# Patient Record
Sex: Female | Born: 1955
Health system: Southern US, Community
[De-identification: ages and names within clinical notes are randomized; demographics above are authoritative.]

## PROBLEM LIST (undated history)

## (undated) DIAGNOSIS — C801 Malignant (primary) neoplasm, unspecified: Secondary | ICD-10-CM

## (undated) DIAGNOSIS — E782 Mixed hyperlipidemia: Secondary | ICD-10-CM

## (undated) DIAGNOSIS — F32A Depression, unspecified: Secondary | ICD-10-CM

## (undated) DIAGNOSIS — I639 Cerebral infarction, unspecified: Secondary | ICD-10-CM

## (undated) DIAGNOSIS — D72819 Decreased white blood cell count, unspecified: Principal | ICD-10-CM

## (undated) DIAGNOSIS — G4730 Sleep apnea, unspecified: Secondary | ICD-10-CM

## (undated) DIAGNOSIS — K219 Gastro-esophageal reflux disease without esophagitis: Secondary | ICD-10-CM

## (undated) DIAGNOSIS — K635 Polyp of colon: Secondary | ICD-10-CM

## (undated) DIAGNOSIS — F419 Anxiety disorder, unspecified: Secondary | ICD-10-CM

## (undated) DIAGNOSIS — I10 Essential (primary) hypertension: Secondary | ICD-10-CM

## (undated) HISTORY — PX: OTHER SURGICAL HISTORY: SHX169

## (undated) HISTORY — DX: Polyp of colon: K63.5

## (undated) HISTORY — DX: Mixed hyperlipidemia: E78.2

## (undated) HISTORY — DX: Depression, unspecified: F32.A

## (undated) HISTORY — DX: Anxiety disorder, unspecified: F41.9

## (undated) HISTORY — DX: Essential (primary) hypertension: I10

## (undated) HISTORY — PX: TUBAL LIGATION: SHX77

## (undated) HISTORY — PX: KNEE ARTHROSCOPY: SUR90

## (undated) HISTORY — DX: Decreased white blood cell count, unspecified: D72.819

## (undated) HISTORY — DX: Cerebral infarction, unspecified: I63.9

## (undated) HISTORY — DX: Gastro-esophageal reflux disease without esophagitis: K21.9

## (undated) HISTORY — DX: Malignant (primary) neoplasm, unspecified: C80.1

---

## 1998-06-09 ENCOUNTER — Other Ambulatory Visit: Admission: RE | Admit: 1998-06-09 | Discharge: 1998-06-09 | Payer: Self-pay | Admitting: Obstetrics and Gynecology

## 1999-12-01 ENCOUNTER — Other Ambulatory Visit: Admission: RE | Admit: 1999-12-01 | Discharge: 1999-12-01 | Payer: Self-pay | Admitting: Obstetrics and Gynecology

## 2000-01-01 ENCOUNTER — Encounter (INDEPENDENT_AMBULATORY_CARE_PROVIDER_SITE_OTHER): Payer: Self-pay | Admitting: Specialist

## 2000-01-01 ENCOUNTER — Other Ambulatory Visit: Admission: RE | Admit: 2000-01-01 | Discharge: 2000-01-01 | Payer: Self-pay | Admitting: Obstetrics and Gynecology

## 2000-12-15 ENCOUNTER — Other Ambulatory Visit: Admission: RE | Admit: 2000-12-15 | Discharge: 2000-12-15 | Payer: Self-pay | Admitting: Obstetrics and Gynecology

## 2001-01-02 ENCOUNTER — Other Ambulatory Visit: Admission: RE | Admit: 2001-01-02 | Discharge: 2001-01-02 | Payer: Self-pay | Admitting: Obstetrics and Gynecology

## 2001-01-02 ENCOUNTER — Encounter (INDEPENDENT_AMBULATORY_CARE_PROVIDER_SITE_OTHER): Payer: Self-pay | Admitting: Specialist

## 2001-01-21 ENCOUNTER — Emergency Department (HOSPITAL_COMMUNITY): Admission: EM | Admit: 2001-01-21 | Discharge: 2001-01-21 | Payer: Self-pay | Admitting: Emergency Medicine

## 2001-01-25 ENCOUNTER — Ambulatory Visit (HOSPITAL_COMMUNITY): Admission: RE | Admit: 2001-01-25 | Discharge: 2001-01-25 | Payer: Self-pay | Admitting: Gastroenterology

## 2001-01-25 ENCOUNTER — Encounter (INDEPENDENT_AMBULATORY_CARE_PROVIDER_SITE_OTHER): Payer: Self-pay | Admitting: Specialist

## 2002-01-22 ENCOUNTER — Other Ambulatory Visit: Admission: RE | Admit: 2002-01-22 | Discharge: 2002-01-22 | Payer: Self-pay | Admitting: Obstetrics and Gynecology

## 2003-02-04 ENCOUNTER — Other Ambulatory Visit: Admission: RE | Admit: 2003-02-04 | Discharge: 2003-02-04 | Payer: Self-pay | Admitting: Obstetrics and Gynecology

## 2004-02-24 ENCOUNTER — Other Ambulatory Visit: Admission: RE | Admit: 2004-02-24 | Discharge: 2004-02-24 | Payer: Self-pay | Admitting: Obstetrics and Gynecology

## 2004-03-25 ENCOUNTER — Ambulatory Visit (HOSPITAL_BASED_OUTPATIENT_CLINIC_OR_DEPARTMENT_OTHER): Admission: RE | Admit: 2004-03-25 | Discharge: 2004-03-25 | Payer: Self-pay | Admitting: Family Medicine

## 2007-07-25 ENCOUNTER — Ambulatory Visit: Payer: Self-pay | Admitting: Family Medicine

## 2007-07-27 ENCOUNTER — Ambulatory Visit: Payer: Self-pay | Admitting: Family Medicine

## 2007-11-04 ENCOUNTER — Ambulatory Visit (HOSPITAL_BASED_OUTPATIENT_CLINIC_OR_DEPARTMENT_OTHER): Admission: RE | Admit: 2007-11-04 | Discharge: 2007-11-04 | Payer: Self-pay | Admitting: Family Medicine

## 2007-12-25 ENCOUNTER — Ambulatory Visit (HOSPITAL_BASED_OUTPATIENT_CLINIC_OR_DEPARTMENT_OTHER): Admission: RE | Admit: 2007-12-25 | Discharge: 2007-12-25 | Payer: Self-pay | Admitting: Family Medicine

## 2008-01-04 ENCOUNTER — Ambulatory Visit: Payer: Self-pay | Admitting: Pulmonary Disease

## 2008-06-05 ENCOUNTER — Ambulatory Visit: Payer: Self-pay | Admitting: Family Medicine

## 2008-12-05 ENCOUNTER — Ambulatory Visit: Payer: Self-pay | Admitting: Family Medicine

## 2008-12-12 ENCOUNTER — Encounter: Admission: RE | Admit: 2008-12-12 | Discharge: 2008-12-12 | Payer: Self-pay | Admitting: Family Medicine

## 2009-07-02 ENCOUNTER — Ambulatory Visit: Payer: Self-pay | Admitting: Family Medicine

## 2009-07-02 ENCOUNTER — Encounter: Admission: RE | Admit: 2009-07-02 | Discharge: 2009-07-02 | Payer: Self-pay | Admitting: Family Medicine

## 2009-11-18 ENCOUNTER — Ambulatory Visit: Payer: Self-pay | Admitting: Family Medicine

## 2010-07-28 ENCOUNTER — Ambulatory Visit: Payer: Self-pay | Admitting: Family Medicine

## 2010-09-15 ENCOUNTER — Ambulatory Visit: Payer: Self-pay | Admitting: Family Medicine

## 2010-10-31 ENCOUNTER — Encounter: Payer: Self-pay | Admitting: Family Medicine

## 2010-11-01 ENCOUNTER — Encounter: Payer: Self-pay | Admitting: Family Medicine

## 2011-02-23 NOTE — Procedures (Signed)
NAME:  Carol Harris, Carol Harris                ACCOUNT NO.:  [id number]   MEDICAL RECORD NO.:  [id number]          PATIENT TYPE:  OUT   LOCATION:  SLEEP CENTER                 FACILITY:  [facility]   PHYSICIAN:  Barbaraann Share, MD,FCCPDATE OF BIRTH:  1954/09/03   DATE OF STUDY:  12/25/2007                            NOCTURNAL POLYSOMNOGRAM   REFERRING PHYSICIAN:   LOCATION:  Sleep Lab.   REFERRING PHYSICIAN:  Dr. Sharlot Gowda.   DATE OF STUDY:  December 25, 2007.   INDICATIONS FOR STUDY:  Hypersomnia with sleep apnea.   EPWORTH SCORE:  11.   SLEEP ARCHITECTURE:  The patient had a total sleep time of 351 minutes  with very slow wave sleep and borderline decreased REM.  Sleep onset  latency was normal at 13 minutes, and REM onset was normal at 74  minutes.  Sleep efficiency was adequate at 92%.   RESPIRATORY DATA:  The patient was found to have 9 obstructive apneas,  10 central apneas, and 11 obstructive hypopneas, for an apnea hypopnea  index of 5 events per hour.  The events were worse in the supine  position, and there was mild snoring noted throughout.   OXYGEN DATA:  There was 02 desaturation as low as 91% with the patient's  obstructive events.   CARDIAC DATA:  No clinically significant arrhythmias were noted.   MOVEMENT/PARASOMNIA:  No significant leg jerks or abnormal behaviors  were noted.   IMPRESSION/RECOMMENDATIONS:  1. Very mild obstructive sleep apnea/hypopnea syndrome with an apnea      hypopnea index of 5 events per hour and 02 desaturation as low as      91%.  Treatment for this degree of sleep apnea can include weight      loss alone if applicable, upper airway surgery, oral appliance, and      also CPAP.  Clinical correlation is suggested. However, given the      very mild nature of her sleep apnea, treatment should be based on      whether this is significantly      impacting her quality of life.  It does not represent an increase      in cardiovascular risk.  2. No  clinically significant arrhythmias or leg jerks were noted      during the night.      Barbaraann Share, MD,FCCP  Diplomate, American Board of Sleep  Medicine  Electronically Signed     [facility]/MED[facility]  D:  01/04/2008 15:42:12  T:  01/04/2008 19:14:23  Job:  [id number]

## 2011-04-21 ENCOUNTER — Encounter: Payer: Self-pay | Admitting: Medical

## 2011-04-21 ENCOUNTER — Ambulatory Visit (INDEPENDENT_AMBULATORY_CARE_PROVIDER_SITE_OTHER): Payer: BC Managed Care – PPO | Admitting: Medical

## 2011-04-21 VITALS — BP 108/78 | HR 64 | Temp 97.5°F | Ht 65.5 in | Wt 151.0 lb

## 2011-04-21 DIAGNOSIS — S61209A Unspecified open wound of unspecified finger without damage to nail, initial encounter: Secondary | ICD-10-CM

## 2011-04-21 DIAGNOSIS — W5501XA Bitten by cat, initial encounter: Secondary | ICD-10-CM

## 2011-04-21 DIAGNOSIS — IMO0001 Reserved for inherently not codable concepts without codable children: Secondary | ICD-10-CM

## 2011-04-21 DIAGNOSIS — Z23 Encounter for immunization: Secondary | ICD-10-CM

## 2011-04-21 MED ORDER — AMOXICILLIN-POT CLAVULANATE 875-125 MG PO TABS: 1.0000 | ORAL_TABLET | Freq: Two times a day (BID) | ORAL | Status: AC

## 2011-04-21 NOTE — Progress Notes (Signed)
Subjective:   HPI Mrs. Lanagan presents for hx/o cat bite.  Her cat who stays inside bit her on her left middle index finger.  No prior similar issue.  She was playing with the cat when this occurred.  This occurred last night, and the finger bled, was swollen, red, and painful, but it is a little better today.  No other aggravating or relieving factors.  No other c/o.  The following portions of the patient's history were reviewed and updated as appropriate: allergies, current medications, past family history, past medical history, past social history, past surgical history and problem list.   Review of Systems Gen: no fever, chills GI: no n/v/d Skin: redness, warmth     Objective:   Physical Exam  General appearance: alert, no distress, WD/WN, white female Skin: left middle finger distal phalanx with erythema, swelling, and puncture wound Pulses: normal 2+ and normal cap refill Neuro: no numbness    Assessment :    Encounter Diagnoses  Name Primary?  . Cat bite Yes  . Need for vaccination with DTP or DTaP      Plan:   Cat bite - Augmentin.  Discussed signs of worse infection that would prompt urgent recheck.  Updated tetanus booster today.  Last one noted in chart was 2002.

## 2011-08-12 ENCOUNTER — Encounter: Payer: BC Managed Care – PPO | Admitting: Oncology

## 2011-09-05 ENCOUNTER — Other Ambulatory Visit: Payer: Self-pay | Admitting: Oncology

## 2011-09-05 ENCOUNTER — Encounter: Payer: Self-pay | Admitting: Oncology

## 2011-09-05 DIAGNOSIS — D72819 Decreased white blood cell count, unspecified: Secondary | ICD-10-CM | POA: Insufficient documentation

## 2011-09-05 DIAGNOSIS — I10 Essential (primary) hypertension: Secondary | ICD-10-CM | POA: Insufficient documentation

## 2011-09-05 DIAGNOSIS — E782 Mixed hyperlipidemia: Secondary | ICD-10-CM | POA: Insufficient documentation

## 2011-09-05 HISTORY — DX: Essential (primary) hypertension: I10

## 2011-09-05 HISTORY — DX: Mixed hyperlipidemia: E78.2

## 2011-09-05 HISTORY — DX: Decreased white blood cell count, unspecified: D72.819

## 2011-09-08 ENCOUNTER — Ambulatory Visit (HOSPITAL_BASED_OUTPATIENT_CLINIC_OR_DEPARTMENT_OTHER): Payer: BC Managed Care – PPO | Admitting: Oncology

## 2011-09-08 ENCOUNTER — Other Ambulatory Visit: Payer: Self-pay | Admitting: Oncology

## 2011-09-08 ENCOUNTER — Encounter: Payer: Self-pay | Admitting: *Deleted

## 2011-09-08 ENCOUNTER — Other Ambulatory Visit (HOSPITAL_BASED_OUTPATIENT_CLINIC_OR_DEPARTMENT_OTHER): Payer: BC Managed Care – PPO | Admitting: Lab

## 2011-09-08 VITALS — BP 114/80 | HR 76 | Temp 97.9°F | Ht 65.25 in | Wt 159.1 lb

## 2011-09-08 DIAGNOSIS — F329 Major depressive disorder, single episode, unspecified: Secondary | ICD-10-CM

## 2011-09-08 DIAGNOSIS — D72819 Decreased white blood cell count, unspecified: Secondary | ICD-10-CM

## 2011-09-08 DIAGNOSIS — F3289 Other specified depressive episodes: Secondary | ICD-10-CM

## 2011-09-08 DIAGNOSIS — D649 Anemia, unspecified: Secondary | ICD-10-CM

## 2011-09-08 DIAGNOSIS — I10 Essential (primary) hypertension: Secondary | ICD-10-CM

## 2011-09-08 DIAGNOSIS — E782 Mixed hyperlipidemia: Secondary | ICD-10-CM

## 2011-09-08 LAB — COMPREHENSIVE METABOLIC PANEL
AST: 19 U/L (ref 0–37)
BUN: 16 mg/dL (ref 6–23)
Calcium: 9.8 mg/dL (ref 8.4–10.5)
Chloride: 102 mEq/L (ref 96–112)
Creatinine, Ser: 0.82 mg/dL (ref 0.50–1.10)
Glucose, Bld: 90 mg/dL (ref 70–99)

## 2011-09-08 LAB — CBC & DIFF AND RETIC
BASO%: 0.3 % (ref 0.0–2.0)
EOS%: 1.3 % (ref 0.0–7.0)
HCT: 42.5 % (ref 34.8–46.6)
Immature Retic Fract: 8.2 % (ref 1.60–10.00)
MCH: 30.8 pg (ref 25.1–34.0)
MCHC: 34.8 g/dL (ref 31.5–36.0)
NEUT%: 58.7 % (ref 38.4–76.8)
RBC: 4.8 10*6/uL (ref 3.70–5.45)
Retic %: 1 % (ref 0.70–2.10)
WBC: 6.2 10*3/uL (ref 3.9–10.3)
lymph#: 1.9 10*3/uL (ref 0.9–3.3)

## 2011-09-08 LAB — MORPHOLOGY

## 2011-09-08 LAB — LACTATE DEHYDROGENASE: LDH: 153 U/L (ref 94–250)

## 2011-09-08 LAB — CHCC SMEAR

## 2011-09-08 NOTE — Progress Notes (Signed)
New Patient Evaluation   Carol Harris [id number] 25-Nov-1955 55 y.o. 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  Carol Harris has been in overall excellent health without any major medical or surgical problems. That is provided as far back as 06/17/10 and total white count was 4553 neutrophils 35 lymphocytes 9 monocytes 1 eosinophil 1 basophil hemoglobin 15 hematocrit 45 MCV 95 platelet count 266,000. White count to fluctuate over time and have been recorded as low as 3500 on 08/02/2011. Differential counts have always been normal. Most recent blood count recorded on/31/12 with white count 3855 neutrophils 34 lymphocytes 8 monocytes 2 eosinophils hemoglobin 13.8 platelets 288,000.  She has no history of a recent or remote severe viral illness. She has never had mononucleosis. She of hepatitis yellow jaundice or malaria. She has not worked around any toxic chemicals. She is not on any medications that are usually associated with leukopenia. She is not known to have any chronic arthritis condition and specifically denies history of rheumatoid arthritis or lupus. She of a blood disorder. One of her brothers had acute myeloid leukemia in his 30s. He was cured with a sibling bone marrow transplant from one of his sisters.  PMH: Past Medical History  Diagnosis Date  . GERD (gastroesophageal reflux disease)   . Hyperlipidemia   . Anxiety   . Hypertension   . Colon polyp   . Leukopenia 09/05/2011  . HTN (hypertension), benign 09/05/2011  . Hyperlipidemia, mixed Depression  09/05/2011    Surgery:  Tubal ligation x2 in the past.  Allergies: Severe reaction to Pneumovax vaccine about 20 years ago with a diffuse swelling of her left arm and side.  Medications: Medications Prior to Admission    Medication Sig Dispense Refill  . amLODipine (NORVASC) 5 MG tablet Take 5 mg by mouth daily.        Marland Kitchen atenolol (TENORMIN) 25 MG tablet Take 12.5 mg by mouth daily.        Marland Kitchen buPROPion (WELLBUTRIN XL) 300 MG 24 hr tablet Take 300 mg by mouth daily.         No current facility-administered medications on file as of 09/08/2011.    Social History:   reports that she has never smoked. She has never used smokeless tobacco. She reports that she drinks about .5 ounces of alcohol per week. She reports that she does not use illicit drugs.  Family History: One brother with AML in remission post allogeneic marrow transplant. 2 sisters alive and well. No other family history of blood disorders.   Review of Systems: Constitutional ROS:  noFever, Chills, Night Sweats,  No Anorexia, Pain  Cardiovascular ROS: no chest pain or dyspnea on exertion Respiratory ROS: no cough, shortness of breath, or wheezing Neurological ROS: no TIA or stroke symptoms Dermatological ROS: negative ENT ROS: negative Gastrointestinal ROS: no abdominal pain, change in bowel habits, or black or bloody stools Genito-Urinary ROS: no dysuria, trouble voiding, or hematuria Hematological and Lymphatic ROS: negative Breast ROS: negative Musculoskeletal ROS: negative Remaining ROS negative.She had a dizzy spell this AM. She developed hypotension/almost passed out after donating blood in the past..   Physical Exam: Blood pressure 114/80, pulse 76, temperature 97.9 F (36.6 C), temperature source Oral, height 5' 5.25" (1.657 m), weight 159 lb 1.6 oz (72.167 kg). General appearance: alert, cooperative and appears  stated age Head: Normocephalic, without obvious abnormality, atraumatic Neck: {neck exam:17463::"no adenopathy","no carotid bruit","no JVD","supple, symmetrical, trachea midline","thyroid not enlarged, symmetric, no tenderness/mass/nodules" Cardiac: no murmur or gallop; regular rhythm Lymph nodes: Cervical,  supraclavicular, and axillary nodes normal. Neurologic: Nerves intact. Optic discs sharp on the right not well visualized on the left. Vessels normal. No carotid bruits. Motor strength 5 over 5. Reflexes absent symmetric at the knees 1+ symmetric at the biceps. Sensation is moderately decreased to vibration by tuning fork exam over the fingertips. Skin: No ecchymoses petechiae or rash.  Lab Results: Lab Results  Component Value Date   WBC 6.2 09/08/2011   HGB 14.8 09/08/2011   HCT 42.5 09/08/2011   MCV 88.5 09/08/2011   PLT 266 09/08/2011     Chemistry   No results found for this basename: NA, K, CL, CO2, BUN, CREATININE, GLU   No results found for this basename: CALCIUM, ALKPHOS, AST, ALT, BILITOT       Pathology: Review of the peripheral blood film today shows overall normal number of neutrophils. The neutrophils have a unusual appearance with peripheral clear zones that are devoid of granules. This is something that I have not encountered before. I don't think it's pathologic but it is a unusual observation. There are decreased lobes in some of the neutrophils. There are occasional benign reactive lymphocytes. Platelets are normal.  Radiological Studies:   Impression and Plan: Intermittent mild leukopenia with normal white count differential and an otherwise healthy woman.  This may just be a variation of normal may represent the footprints of a prior viral infection. She is not anemic. She has a normal platelet count. Normal physical exam. Although it is possible that this is medication related, Wellbutrin is not a common offender. I would consider tapering her off the Wellbutrin for just 2 months to see if it makes any difference. This being said, she is on her Wellbutrin right now and her white count today is normal.  Recommendation I feel comfortable with observation alone. Schedule a formal revisit here but would be happy to see her again in the future if things change.       Levert Feinstein, MD 09/08/2011, 3:31 PM

## 2011-09-08 NOTE — Progress Notes (Signed)
Copy of cbc/morphology routed to Dr Dola Factor & PA Roxanne Mins per Dr. Cyndie Chime.

## 2011-11-08 ENCOUNTER — Other Ambulatory Visit: Payer: Self-pay | Admitting: Oncology

## 2011-11-08 NOTE — Progress Notes (Signed)
nrnrfnr

## 2012-02-22 ENCOUNTER — Ambulatory Visit (INDEPENDENT_AMBULATORY_CARE_PROVIDER_SITE_OTHER): Payer: BC Managed Care – PPO | Admitting: Medical

## 2012-02-22 ENCOUNTER — Telehealth: Payer: Self-pay | Admitting: Medical

## 2012-02-22 ENCOUNTER — Encounter: Payer: Self-pay | Admitting: Medical

## 2012-02-22 VITALS — BP 120/80 | HR 82 | Temp 98.3°F | Resp 16 | Wt 159.0 lb

## 2012-02-22 DIAGNOSIS — M25562 Pain in left knee: Secondary | ICD-10-CM

## 2012-02-22 DIAGNOSIS — M25569 Pain in unspecified knee: Secondary | ICD-10-CM

## 2012-02-22 MED ORDER — DICLOFENAC SODIUM 75 MG PO TBEC: 75.0000 mg | DELAYED_RELEASE_TABLET | Freq: Two times a day (BID) | ORAL | Status: AC

## 2012-02-22 NOTE — Progress Notes (Signed)
Subjective: Here for c/o left knee.  Started several weeks ago.  She denies specific injury or trauma.  She notes swelling, pain, recently developed some pain in back of knee and quadriceps pain.  She notes right meniscal tear 10 years ago (degenerative in nature), and thinks this feels the same on the left now.  Saw Dr. Ethelene Browns [place]/ortho for this prior.  Able to bear weight but walks gingerly. Pain with flexion of knee, stairs difficult.  Using Aspirin some, Ice, rest.  Was up in [place] a few weeks ago and did lots of walking, but had pain prior.  No other aggravating or relieving factors.    ROS: Gen: no fever, chills Neuro: no numbness, tingling, weakness Skin: no rash or bruising    Objective:   Physical Exam  Filed Vitals:   02/22/12 0831  BP: 120/80  Pulse: 82  Temp: 98.3 F (36.8 C)  Resp: 16    General appearance: alert, no distress, WD/WN MSK: left knee with mild swelling generalized compared to right, pain with passive flexion, tender across joint line on anteromedial region, tender popliteal region, pain with McMurrays but no click, but no warmth, erythema or ecchymosis.  Otherwise LE exam unremarkable. Neurovascularly intact   Assessment and Plan :    Encounter Diagnosis  Name Primary?  . Left knee pain Yes   At her request, we will refer to orthopedics, Dr. Durward [facility]es [facility] for left knee pain.  For now, advised rest, elevation, ice, Diclofenac.  Offered crutches but she declines.  Suspect inflammatory process vs meniscal injury.

## 2012-02-22 NOTE — Patient Instructions (Signed)
For now, rest the leg as much as possible, stay off the leg when possible.   Begin Diclofenac twice daily.  Use ice, preferably, 2-3 times daily for the next several days.  Continue using the knee sleeve.    Elevate the leg when possible.    We will refer you back to Dr. Thomasena Edis.

## 2012-02-23 NOTE — Telephone Encounter (Signed)
I CALLED PATIENT AND MADE HER AWARE OF HER APPOINTMENT AT [place], OR[facility]. [address] ON Friday 02/25/12 @ 800 AM WITH DR. Thomasena Edis ON 02/23/12 @ 234 PM. CLS

## 2013-12-19 ENCOUNTER — Emergency Department (HOSPITAL_COMMUNITY): Payer: BC Managed Care – PPO

## 2013-12-19 ENCOUNTER — Inpatient Hospital Stay (HOSPITAL_COMMUNITY)
Admission: EM | Admit: 2013-12-19 | Discharge: 2013-12-20 | DRG: 066 | Disposition: A | Discharge status: Home Health | Payer: BC Managed Care – PPO | Attending: Internal Medicine | Admitting: Internal Medicine

## 2013-12-19 ENCOUNTER — Encounter (HOSPITAL_COMMUNITY): Payer: Self-pay | Admitting: Emergency Medicine

## 2013-12-19 ENCOUNTER — Inpatient Hospital Stay (HOSPITAL_COMMUNITY): Payer: BC Managed Care – PPO

## 2013-12-19 DIAGNOSIS — I6350 Cerebral infarction due to unspecified occlusion or stenosis of unspecified cerebral artery: Principal | ICD-10-CM | POA: Diagnosis present

## 2013-12-19 DIAGNOSIS — I639 Cerebral infarction, unspecified: Secondary | ICD-10-CM | POA: Diagnosis present

## 2013-12-19 DIAGNOSIS — I739 Peripheral vascular disease, unspecified: Secondary | ICD-10-CM | POA: Diagnosis present

## 2013-12-19 DIAGNOSIS — Z8249 Family history of ischemic heart disease and other diseases of the circulatory system: Secondary | ICD-10-CM

## 2013-12-19 DIAGNOSIS — I6381 Other cerebral infarction due to occlusion or stenosis of small artery: Secondary | ICD-10-CM

## 2013-12-19 DIAGNOSIS — E782 Mixed hyperlipidemia: Secondary | ICD-10-CM

## 2013-12-19 DIAGNOSIS — I10 Essential (primary) hypertension: Secondary | ICD-10-CM | POA: Diagnosis present

## 2013-12-19 DIAGNOSIS — F411 Generalized anxiety disorder: Secondary | ICD-10-CM | POA: Diagnosis present

## 2013-12-19 DIAGNOSIS — Z79899 Other long term (current) drug therapy: Secondary | ICD-10-CM

## 2013-12-19 DIAGNOSIS — K219 Gastro-esophageal reflux disease without esophagitis: Secondary | ICD-10-CM | POA: Diagnosis present

## 2013-12-19 DIAGNOSIS — I959 Hypotension, unspecified: Secondary | ICD-10-CM | POA: Diagnosis not present

## 2013-12-19 HISTORY — DX: Other cerebral infarction due to occlusion or stenosis of small artery: I63.81

## 2013-12-19 LAB — CBC WITH DIFFERENTIAL/PLATELET
Basophils Absolute: 0 10*3/uL (ref 0.0–0.1)
Basophils Relative: 1 % (ref 0–1)
EOS PCT: 1 % (ref 0–5)
Eosinophils Absolute: 0.1 10*3/uL (ref 0.0–0.7)
HEMATOCRIT: 41.4 % (ref 36.0–46.0)
Hemoglobin: 14.4 g/dL (ref 12.0–15.0)
LYMPHS ABS: 1.1 10*3/uL (ref 0.7–4.0)
Lymphocytes Relative: 26 % (ref 12–46)
MCH: 31.6 pg (ref 26.0–34.0)
MCHC: 34.8 g/dL (ref 30.0–36.0)
MCV: 90.8 fL (ref 78.0–100.0)
MONO ABS: 0.5 10*3/uL (ref 0.1–1.0)
Monocytes Relative: 11 % (ref 3–12)
Neutro Abs: 2.6 10*3/uL (ref 1.7–7.7)
Neutrophils Relative %: 61 % (ref 43–77)
Platelets: 222 10*3/uL (ref 150–400)
RBC: 4.56 MIL/uL (ref 3.87–5.11)
RDW: 12.4 % (ref 11.5–15.5)
WBC: 4.3 10*3/uL (ref 4.0–10.5)

## 2013-12-19 LAB — URINALYSIS, ROUTINE W REFLEX MICROSCOPIC
BILIRUBIN URINE: NEGATIVE
Glucose, UA: NEGATIVE mg/dL
Hgb urine dipstick: NEGATIVE
KETONES UR: NEGATIVE mg/dL
Leukocytes, UA: NEGATIVE
NITRITE: NEGATIVE
Protein, ur: NEGATIVE mg/dL
Specific Gravity, Urine: 1.01 (ref 1.005–1.030)
Urobilinogen, UA: 0.2 mg/dL (ref 0.0–1.0)
pH: 8 (ref 5.0–8.0)

## 2013-12-19 LAB — COMPREHENSIVE METABOLIC PANEL
ALT: 13 U/L (ref 0–35)
AST: 16 U/L (ref 0–37)
Albumin: 3.9 g/dL (ref 3.5–5.2)
Alkaline Phosphatase: 57 U/L (ref 39–117)
BUN: 15 mg/dL (ref 6–23)
CALCIUM: 9.8 mg/dL (ref 8.4–10.5)
CO2: 24 meq/L (ref 19–32)
CREATININE: 0.67 mg/dL (ref 0.50–1.10)
Chloride: 98 mEq/L (ref 96–112)
GLUCOSE: 100 mg/dL — AB (ref 70–99)
Potassium: 4.2 mEq/L (ref 3.7–5.3)
SODIUM: 137 meq/L (ref 137–147)
Total Bilirubin: 0.3 mg/dL (ref 0.3–1.2)
Total Protein: 7.4 g/dL (ref 6.0–8.3)

## 2013-12-19 LAB — TROPONIN I: Troponin I: <0.3 ng/mL (ref <0.30)

## 2013-12-19 MED ORDER — ACETAMINOPHEN 650 MG RE SUPP: 650.0000 mg | RECTAL | Status: DC | PRN

## 2013-12-19 MED ORDER — PANTOPRAZOLE SODIUM 40 MG PO TBEC
40.0000 mg | DELAYED_RELEASE_TABLET | Freq: Every day | ORAL | Status: DC
  Administered 2013-12-19 – 2013-12-20 (×2): 40 mg via ORAL
  Filled 2013-12-19 (×2): qty 1

## 2013-12-19 MED ORDER — ACETAMINOPHEN 325 MG PO TABS: 650.0000 mg | ORAL_TABLET | ORAL | Status: DC | PRN

## 2013-12-19 MED ORDER — SENNOSIDES-DOCUSATE SODIUM 8.6-50 MG PO TABS: 1.0000 | ORAL_TABLET | Freq: Every evening | ORAL | Status: DC | PRN

## 2013-12-19 MED ORDER — SODIUM CHLORIDE 0.9 % IV SOLN
INTRAVENOUS | Status: DC
  Administered 2013-12-19: 19:00:00 via INTRAVENOUS

## 2013-12-19 MED ORDER — ASPIRIN 300 MG RE SUPP: 300.0000 mg | Freq: Every day | RECTAL | Status: DC

## 2013-12-19 MED ORDER — STUDY - INVESTIGATIONAL DRUG SIMPLE RECORD
90.0000 mg | Freq: Two times a day (BID) | Status: DC
  Administered 2013-12-20: 90 mg via ORAL
  Filled 2013-12-19 (×2): qty 90

## 2013-12-19 MED ORDER — STUDY - INVESTIGATIONAL DRUG SIMPLE RECORD
180.0000 mg | Freq: Once | Status: AC
  Administered 2013-12-19: 180 mg via ORAL
  Filled 2013-12-19: qty 180

## 2013-12-19 MED ORDER — BUPROPION HCL ER (XL) 300 MG PO TB24
300.0000 mg | ORAL_TABLET | Freq: Every day | ORAL | Status: DC
  Administered 2013-12-20: 300 mg via ORAL
  Filled 2013-12-19: qty 1

## 2013-12-19 MED ORDER — ASPIRIN 325 MG PO TABS: 325.0000 mg | ORAL_TABLET | Freq: Every day | ORAL | Status: DC

## 2013-12-19 MED ORDER — ENOXAPARIN SODIUM 40 MG/0.4ML ~~LOC~~ SOLN
40.0000 mg | SUBCUTANEOUS | Status: DC
  Administered 2013-12-19: 40 mg via SUBCUTANEOUS
  Filled 2013-12-19 (×2): qty 0.4

## 2013-12-19 MED ORDER — VITAMIN D3 25 MCG (1000 UNIT) PO TABS
5000.0000 [IU] | ORAL_TABLET | Freq: Every day | ORAL | Status: DC
  Administered 2013-12-20: 5000 [IU] via ORAL
  Filled 2013-12-19: qty 5

## 2013-12-19 NOTE — ED Notes (Signed)
Patient transported to CT 

## 2013-12-19 NOTE — ED Notes (Signed)
Pt reports that she woke up this morning with vision changes to both eyes, reports she feels like she is seeing double. Also feels like she is leaning to the right when she walks. Grip strengths are normal, no arm drift. Pt A&O x4 at triage.

## 2013-12-19 NOTE — H&P (Signed)
History and Physical       Hospital Admission Note Date: 12/19/2013  Patient name: Carol Harris Medical record number: [id number] Date of birth: October 15, 1955 Age: 58 y.o. Gender: female  PCP: Wyatt Haste, MD    Chief Complaint:  Difficulty finding words, vertical diplopia since morning  HPI: Patient is a 58 year old female with history of hypertension, hyperlipidemia, anxiety, GERD who is otherwise very functional, went to sleep at baseline last night around 10:30 PM. This morning, patient's arm was ringing and she did not wake up. Her husband workup about a 45 minutes later, around 6:40 AM, he noticed it she was slurring her speech and having difficulty finding words. She got up to use the bathroom and was drifting towards the right while walking. While looking in the mirror and in the bathroom,  she noticed vertical diplopia. Patient's daughter also noticed slight right facial droop and abnormal eye movements which caused them are concerned and presented to the ED. She was not consider TPA candidate due to the late presentation. Patient underwent CT head which showed a focal area of decreased attenuation in the posterior superior right temporal lobe ? Early infarct.  MRI of the brain showed acute left thalamic infarct without hemorrhage. MRA showed no intracranial flow reducing lesion or dissection. Patient complaint of atypical chest pain with GI symptoms of acid reflux and heartburn in the past one week which has now improved.  Review of Systems:  Constitutional: Denies fever, chills, diaphoresis, poor appetite and fatigue.  HEENT: Denies photophobia, eye pain, redness, hearing loss, ear pain, congestion, sore throat, rhinorrhea, sneezing, mouth sores, trouble swallowing, neck pain, neck stiffness and tinnitus.   Respiratory: Denies SOB, DOE, cough, chest tightness,  and wheezing.   Cardiovascular: Denies chest pain,  palpitations and leg swelling.  Gastrointestinal: Patient reported significant GERD during the last week which has improved now. Denies nausea, vomiting, abdominal pain, diarrhea, constipation, blood in stool and abdominal distention.  Genitourinary: Denies dysuria, urgency, frequency, hematuria, flank pain and difficulty urinating.  Musculoskeletal: Denies myalgias, back pain, joint swelling, arthralgias and gait problem.  Skin: Denies pallor, rash and wound.  Neurological: Please see history of present illness Hematological: Denies adenopathy. Easy bruising, personal or family bleeding history  Psychiatric/Behavioral: Denies suicidal ideation, mood changes, confusion, nervousness, sleep disturbance and agitation  Past Medical History: Past Medical History  Diagnosis Date  . GERD (gastroesophageal reflux disease)   . Anxiety   . Colon polyp   . Leukopenia 09/05/2011  . Hyperlipidemia, mixed 09/05/2011  . HTN (hypertension), benign 09/05/2011   History reviewed. No pertinent past surgical history.  Medications: Prior to Admission medications   Medication Sig Start Date End Date Taking? Authorizing Provider  Ascorbic Acid (VITAMIN C) 1000 MG tablet Take 1,000 mg by mouth 4 (four) times a week.   Yes Historical Provider, MD  atenolol (TENORMIN) 25 MG tablet Take 12.5 mg by mouth 2 (two) times daily.    Yes Historical Provider, MD  buPROPion (WELLBUTRIN XL) 300 MG 24 hr tablet Take 300 mg by mouth daily.     Yes Historical Provider, MD  cholecalciferol (VITAMIN D) 1000 UNITS tablet Take 5,000 Units by mouth daily.     Yes Historical Provider, MD  olmesartan (BENICAR) 20 MG tablet Take 10 mg by mouth daily.   Yes Historical Provider, MD    Allergies:  No Known Allergies  Social History:  reports that she has never smoked. She has never used smokeless tobacco. She reports that she drinks  about 0.5 ounces of alcohol per week. She reports that she does not use illicit drugs.  Family  History: Family History  Problem Relation Age of Onset  . Hypertension Father   . Hypertension Mother   . Hyperlipidemia Father     Physical Exam: Blood pressure 141/100, pulse 62, temperature 98.1 F (36.7 C), temperature source Oral, resp. rate 12, height 5\' 5"  (1.651 m), weight 70.308 kg (155 lb), SpO2 96.00%. General: Alert, awake, oriented x3, in no acute distress, intermittent word finding difficulty but no significant dysarthria. HEENT: normocephalic, atraumatic, anicteric sclera, pink conjunctiva, slight horizontal nystagmus, left ptosis, opharynx clear Neck: supple, no masses or lymphadenopathy, no goiter, no bruits  Heart: Regular rate and rhythm, without murmurs, rubs or gallops. Lungs: Clear to auscultation bilaterally, no wheezing, rales or rhonchi. Abdomen: Soft, nontender, nondistended, positive bowel sounds, no masses. Extremities: No clubbing, cyanosis or edema with positive pedal pulses. Neuro: Gait not assessed, normal finger to nose and normal heel-to-shin, right and left upper and lower extremity 5/5 Psych: alert and oriented x 3, normal mood and affect Skin: no rashes or lesions, warm and dry   LABS on Admission:  Basic Metabolic Panel:  Recent Labs Lab 12/19/13 0930  NA 137  K 4.2  CL 98  CO2 24  GLUCOSE 100*  BUN 15  CREATININE 0.67  CALCIUM 9.8   Liver Function Tests:  Recent Labs Lab 12/19/13 0930  AST 16  ALT 13  ALKPHOS 57  BILITOT 0.3  PROT 7.4  ALBUMIN 3.9   No results found for this basename: LIPASE, AMYLASE,  in the last 168 hours No results found for this basename: AMMONIA,  in the last 168 hours CBC:  Recent Labs Lab 12/19/13 0930  WBC 4.3  NEUTROABS 2.6  HGB 14.4  HCT 41.4  MCV 90.8  PLT 222   Cardiac Enzymes: No results found for this basename: CKTOTAL, CKMB, CKMBINDEX, TROPONINI,  in the last 168 hours BNP: No components found with this basename: POCBNP,  CBG: No results found for this basename: GLUCAP,  in the  last 168 hours   Radiological Exams on Admission: Ct Head Wo Contrast  12/19/2013   CLINICAL DATA Diplopia  EXAM CT HEAD WITHOUT CONTRAST  TECHNIQUE Contiguous axial images were obtained from the base of the skull through the vertex without intravenous contrast. Study was obtained within 24 hr of patient's arrival at the emergency department.  COMPARISON None.  FINDINGS The ventricles are normal in size and configuration. There is no mass, hemorrhage, extra-axial fluid collection, or midline shift. There is a focal area of decreased attenuation in the superior posterior right temporal lobe, seen on axial slice 15. An early infarct in this area must be question. Elsewhere, gray-white compartments appear normal. Bony calvarium appears intact. The mastoid air cells are clear. 1 no intraorbital lesions are appreciated on this study. 53  IMPRESSION Focal area of decreased attenuation in the posterior superior right temporal lobe. An early infarct in this area is questioned. Elsewhere gray-white compartments appear normal. There is no demonstrable hemorrhage or mass effect.  SIGNATURE  Electronically Signed   By: Lowella Grip M.D.   On: 12/19/2013 10:05   Mr Angiogram Head Wo Contrast  12/19/2013   CLINICAL DATA Slurred speech.  Abnormal eye movements.  Right facial droop.  EXAM MRI HEAD WITHOUT CONTRAST  MRA HEAD WITHOUT CONTRAST  TECHNIQUE Multiplanar, multiecho pulse sequences of the brain and surrounding structures were obtained without intravenous contrast. Angiographic images of the  head were obtained using MRA technique without contrast.  COMPARISON CT head earlier today.  FINDINGS MRI HEAD FINDINGS  There is an acute left thalamic infarct without hemorrhage. This measures slightly greater than 1 cm in diameter. No significant mass effect. No associated hemorrhage.  Slight premature for age cerebral and cerebellar atrophy. No appreciable white matter disease. No foci of chronic hemorrhage. No large  vessel or lacunar infarction.  Pituitary, pineal, and cerebellar tonsils unremarkable. No upper cervical lesions. Visualized calvarium, skull base, and upper cervical osseous structures unremarkable. Scalp and extracranial soft tissues, orbits, sinuses, and mastoids show no acute process.  MRA HEAD FINDINGS  Dolichoectatic but widely patent internal carotid arteries. Dolichoectatic and widely patent basilar artery. Left vertebral dominant. Right vertebral felt to be congenitally smaller, not diseased. No ACA or PCA stenosis. No MCA irregularity or stenosis. Fetal origin right PCA. No evidence for basilar dissection. No visible intracranial aneurysm. Bulbous origin right superior cerebellar artery.  IMPRESSION Acute left thalamic infarct without hemorrhage, slightly greater than 1 cm in diameter. No evidence for previous lacunar infarction or significant white matter disease.  Mild premature atrophy.  No intracranial flow reducing lesion or dissection is observed.  SIGNATURE  Electronically Signed   By: Rolla Flatten M.D.   On: 12/19/2013 13:32   Mr Brain Wo Contrast  12/19/2013   CLINICAL DATA Slurred speech.  Abnormal eye movements.  Right facial droop.  EXAM MRI HEAD WITHOUT CONTRAST  MRA HEAD WITHOUT CONTRAST  TECHNIQUE Multiplanar, multiecho pulse sequences of the brain and surrounding structures were obtained without intravenous contrast. Angiographic images of the head were obtained using MRA technique without contrast.  COMPARISON CT head earlier today.  FINDINGS MRI HEAD FINDINGS  There is an acute left thalamic infarct without hemorrhage. This measures slightly greater than 1 cm in diameter. No significant mass effect. No associated hemorrhage.  Slight premature for age cerebral and cerebellar atrophy. No appreciable white matter disease. No foci of chronic hemorrhage. No large vessel or lacunar infarction.  Pituitary, pineal, and cerebellar tonsils unremarkable. No upper cervical lesions. Visualized  calvarium, skull base, and upper cervical osseous structures unremarkable. Scalp and extracranial soft tissues, orbits, sinuses, and mastoids show no acute process.  MRA HEAD FINDINGS  Dolichoectatic but widely patent internal carotid arteries. Dolichoectatic and widely patent basilar artery. Left vertebral dominant. Right vertebral felt to be congenitally smaller, not diseased. No ACA or PCA stenosis. No MCA irregularity or stenosis. Fetal origin right PCA. No evidence for basilar dissection. No visible intracranial aneurysm. Bulbous origin right superior cerebellar artery.  IMPRESSION Acute left thalamic infarct without hemorrhage, slightly greater than 1 cm in diameter. No evidence for previous lacunar infarction or significant white matter disease.  Mild premature atrophy.  No intracranial flow reducing lesion or dissection is observed.  SIGNATURE  Electronically Signed   By: Rolla Flatten M.D.   On: 12/19/2013 13:32    Assessment/Plan Principal Problem:  Acute CVA (cerebral infarction), acute thalamic infarct - MRI confirms acute left thalamic infarct without hemorrhage - MRA showed no intracranial flow reducing lesion or dissection - Will admit for full stroke workup, obtain 2-D echo, carotid Dopplers - Patient was not on aspirin prior to admission, start on aspirin 325 mg daily, swallow evaluation, PT/OT evaluation - Hemoglobin A1c, lipid panel -  Will continue IV fluid hydration, hold off on the antihypertensives tonight  Active Problems:   HTN (hypertension), benign - Per patient BP was 150/100 at home, currently 141/100, will hold off on  antihypertensives tonight due to acute cerebral infarction    Hyperlipidemia, mixed - Obtain lipid panel, patient is not on any statins PTA    GERD (gastroesophageal reflux disease) atypical chest pain - Patient reports that she had symptoms of heartburn in the last one week which has now improved, started on PPI  - Obtain cardiac enzymes to complete  workup, 2-D echo for further work-up. EKG did not show any acute ST-T wave changes suggestive of ischemia.  DVT prophylaxis: Lovenox   CODE STATUS: Full code   Family Communication: Admission, patients condition and plan of care including tests being ordered have been discussed with the patient, her husband and daughter who indicates understanding and agree with the plan and Code Status   Further plan will depend as patient's clinical course evolves and further radiologic and laboratory data become available.   Time Spent on Admission: 1 hour  Keevan Wolz M.D. [facility] 12/19/2013, 3:36 PM Pager: [phone]  If 7PM-7AM, please contact night-coverage [email or web address] Password [email or web address]

## 2013-12-19 NOTE — ED Notes (Signed)
Patient transported to MRI 

## 2013-12-19 NOTE — ED Notes (Signed)
Dr. Reynolds at bedside.

## 2013-12-19 NOTE — Consult Note (Signed)
Referring Physician: Doy Mince    Chief Complaint: vertical diplopia, word finding difficulty  HPI:                                                                                                                                         Carol Harris is an 58 y.o. female  Who went to sleep at baseline last night.  Patient awoke this am and noted she had vertical diplopia and was drifting to the right.  Family noted she had slurred words and some word finding difficulty. Over the course of one hour her symptoms of slurred speech seemed to clear and her vertical diplopia cleared but when she was reading she felt like the words were "bobbing".  Her daughter is a Marine scientist and noted she had a slight right facial droop and some abnormal eye movements--this prompted them to come to the ED.   Date last known well: Date: 12/18/2013 Time last known well: Time: 22:30 tPA Given: No: Outside time window  Past Medical History  Diagnosis Date  . GERD (gastroesophageal reflux disease)   . Anxiety   . Colon polyp   . Leukopenia 09/05/2011  . Hyperlipidemia, mixed 09/05/2011  . HTN (hypertension), benign 09/05/2011    History reviewed. No pertinent past surgical history.  Family History  Problem Relation Age of Onset  . Hypertension Father   . Hypertension Mother   . Hyperlipidemia Father    Social History:  reports that she has never smoked. She has never used smokeless tobacco. She reports that she drinks about 0.5 ounces of alcohol per week. She reports that she does not use illicit drugs.  Allergies: No Known Allergies  Medications:                                                                                                                           No current facility-administered medications for this encounter.   Current Outpatient Prescriptions  Medication Sig Dispense Refill  . Ascorbic Acid (VITAMIN C) 1000 MG tablet Take 1,000 mg by mouth 4 (four) times a week.      Marland Kitchen atenolol  (TENORMIN) 25 MG tablet Take 12.5 mg by mouth 2 (two) times daily.       Marland Kitchen buPROPion (WELLBUTRIN XL) 300 MG 24 hr tablet Take 300 mg by mouth daily.        Marland Kitchen  cholecalciferol (VITAMIN D) 1000 UNITS tablet Take 5,000 Units by mouth daily.        Marland Kitchen olmesartan (BENICAR) 20 MG tablet Take 10 mg by mouth daily.         ROS:                                                                                                                                       History obtained from the patient  General ROS: negative for - chills, fatigue, fever, night sweats, weight gain or weight loss Psychological ROS: negative for - behavioral disorder, hallucinations, memory difficulties, mood swings or suicidal ideation Ophthalmic ROS: posative for - blurry vision, double vision, ENT ROS: negative for - epistaxis, nasal discharge, oral lesions, sore throat, tinnitus or vertigo Allergy and Immunology ROS: negative for - hives or itchy/watery eyes Hematological and Lymphatic ROS: negative for - bleeding problems, bruising or swollen lymph nodes Endocrine ROS: negative for - galactorrhea, hair pattern changes, polydipsia/polyuria or temperature intolerance Respiratory ROS: negative for - cough, hemoptysis, shortness of breath or wheezing Cardiovascular ROS: negative for - chest pain, dyspnea on exertion, edema or irregular heartbeat Gastrointestinal ROS: negative for - abdominal pain, diarrhea, hematemesis, nausea/vomiting or stool incontinence Genito-Urinary ROS: negative for - dysuria, hematuria, incontinence or urinary frequency/urgency Musculoskeletal ROS: negative for - joint swelling or muscular weakness Neurological ROS: as noted in HPI Dermatological ROS: negative for rash and skin lesion changes  Neurologic Examination:                                                                                                      Blood pressure 141/100, pulse 62, temperature 97.1 F (36.2 C), temperature source Oral,  resp. rate 12, height 5\' 5"  (1.651 m), weight 70.308 kg (155 lb), SpO2 96.00%.  Mental Status: Alert, oriented, thought content appropriate.  Speech fluent with intermittent word finding difficulty.  Able to follow 3 step commands without difficulty but has more difficulty with complex commands Cranial Nerves: II: Discs flat bilaterally; Visual fields grossly normal, pupils equal, round, reactive to light and accommodation III,IV, VI: left ptosis, extra-ocular motions intact bilaterally-with horizontal movements she shows torsion al nystagmus most noted when she is looking to the left.  V,VII: smile symmetric but shows a slight right NL fold decrease, facial light touch sensation normal bilaterally VIII: hearing normal bilaterally IX,X: gag reflex present XI: bilateral shoulder shrug XII: midline tongue extension without atrophy or fasciculations  Motor: Right : Upper extremity  5/5    Left:     Upper extremity   5/5  Lower extremity   5/5     Lower extremity   5/5 Tone and bulk:normal tone throughout; no atrophy noted Sensory: Pinprick and light touch intact throughout, bilaterally Deep Tendon Reflexes:  Right: Upper Extremity   Left: Upper extremity   biceps (C-5 to C-6) 2/4   biceps (C-5 to C-6) 2/4 tricep (C7) 2/4    triceps (C7) 2/4 Brachioradialis (C6) 2/4  Brachioradialis (C6) 2/4  Lower Extremity Lower Extremity  quadriceps (L-2 to L-4) 2/4   quadriceps (L-2 to L-4) 2/4 Achilles (S1) 2/4   Achilles (S1) 2/4  Plantars: Right: downgoing   Left: downgoing Cerebellar: normal finger-to-nose,  normal heel-to-shin test Gait: slightly unsteady but shows no romberg and tends to fall to the left with tandem gait.  CV: pulses palpable throughout    Lab Results: Basic Metabolic Panel:  Recent Labs Lab 12/19/13 0930  NA 137  K 4.2  CL 98  CO2 24  GLUCOSE 100*  BUN 15  CREATININE 0.67  CALCIUM 9.8    Liver Function Tests:  Recent Labs Lab 12/19/13 0930  AST 16   ALT 13  ALKPHOS 57  BILITOT 0.3  PROT 7.4  ALBUMIN 3.9   No results found for this basename: LIPASE, AMYLASE,  in the last 168 hours No results found for this basename: AMMONIA,  in the last 168 hours  CBC:  Recent Labs Lab 12/19/13 0930  WBC 4.3  NEUTROABS 2.6  HGB 14.4  HCT 41.4  MCV 90.8  PLT 222    Cardiac Enzymes: No results found for this basename: CKTOTAL, CKMB, CKMBINDEX, TROPONINI,  in the last 168 hours  Lipid Panel: No results found for this basename: CHOL, TRIG, HDL, CHOLHDL, VLDL, LDLCALC,  in the last 168 hours  CBG: No results found for this basename: GLUCAP,  in the last 168 hours  Microbiology: No results found for this or any previous visit.  Coagulation Studies: No results found for this basename: LABPROT, INR,  in the last 72 hours  Imaging: Ct Head Wo Contrast  12/19/2013   CLINICAL DATA Diplopia  EXAM CT HEAD WITHOUT CONTRAST  TECHNIQUE Contiguous axial images were obtained from the base of the skull through the vertex without intravenous contrast. Study was obtained within 24 hr of patient's arrival at the emergency department.  COMPARISON None.  FINDINGS The ventricles are normal in size and configuration. There is no mass, hemorrhage, extra-axial fluid collection, or midline shift. There is a focal area of decreased attenuation in the superior posterior right temporal lobe, seen on axial slice 15. An early infarct in this area must be question. Elsewhere, gray-white compartments appear normal. Bony calvarium appears intact. The mastoid air cells are clear. 1 no intraorbital lesions are appreciated on this study. 88  IMPRESSION Focal area of decreased attenuation in the posterior superior right temporal lobe. An early infarct in this area is questioned. Elsewhere gray-white compartments appear normal. There is no demonstrable hemorrhage or mass effect.  SIGNATURE  Electronically Signed   By: Lowella Grip M.D.   On: 12/19/2013 10:05    Etta Quill PA-C [facility] [phone]  12/19/2013, 11:54 AM   Patient seen and examined.  Clinical course and management discussed.  Necessary edits performed.  I agree with the above.  Assessment and plan of care developed and discussed below.    Assessment: 58 y.o. female presenting with complaints of diplopia, difficulty with  speech and gait instability.  Head CT showed a questionable infarct.  MRI of the brian has been reviewed and shows a left thalamic infarct. MRA shows no significant stenosis.  Patient has been having some issues with her antihypertensives and her BP (small vessel disease) may very well be the cause of acute infarct.  Further work up recommended.    Stroke Risk Factors - hyperlipidemia and hypertension  Recommendations: 1. HgbA1c, fasting lipid panel 2. PT consult, OT consult, Speech consult 3. Echocardiogram 4. Carotid dopplers 5. Prophylactic therapy-Antiplatelet med: Aspirin - dose 325mg  daily 6. Risk factor modification 7. Telemetry monitoring 8. Frequent neuro checks  Alexis Goodell, MD [facility] [phone]  12/19/2013  1:51 PM

## 2013-12-19 NOTE — ED Provider Notes (Signed)
CSN: [id number]     Arrival date & time 12/19/13  [id number] History   First MD Initiated Contact with Patient 12/19/13 [phone]     Chief Complaint  Patient presents with  . Eye Problem  . Difficulty Walking     (Consider location/radiation/quality/duration/timing/severity/associated sxs/prior Treatment) Patient is a 58 y.o. female presenting with eye problem.  Eye Problem Location:  Both Quality: double vision. Severity:  Severe Onset quality:  Sudden Duration:  2 hours Timing:  Constant Progression:  Resolved Chronicity:  New Context comment:  Did not wake up to alarm this morning, woke up about 45 min later when husband woke her.  This is when she first noticed double vision.  Relieved by:  Nothing Worsened by:  Nothing tried Associated symptoms: no nausea and no vomiting   Associated symptoms comment:  Also had a several minute episode of "talking gibberish"according to her husband.   When walking, her husband reports that she continually fell to the right side, requiring him to guide her.   Past Medical History  Diagnosis Date  . GERD (gastroesophageal reflux disease)   . Anxiety   . Colon polyp   . Leukopenia 09/05/2011  . Hyperlipidemia, mixed 09/05/2011  . HTN (hypertension), benign 09/05/2011   History reviewed. No pertinent past surgical history. History reviewed. No pertinent family history. History  Substance Use Topics  . Smoking status: Never Smoker   . Smokeless tobacco: Never Used  . Alcohol Use: 0.5 oz/week    1 drink(s) per week   OB History   Grav Para Term Preterm Abortions TAB SAB Ect Mult Living                 Review of Systems  Constitutional: Negative for fever.  HENT: Negative for congestion.   Respiratory: Negative for cough and shortness of breath.   Cardiovascular: Negative for chest pain.  Gastrointestinal: Negative for nausea, vomiting, abdominal pain and diarrhea.  All other systems reviewed and are negative.      Allergies   Review of patient's allergies indicates no known allergies.  Home Medications   Current Outpatient Rx  Name  Route  Sig  Dispense  Refill  . amLODipine (NORVASC) 5 MG tablet   Oral   Take 5 mg by mouth daily.           Marland Kitchen aspirin 81 MG tablet   Oral   Take 81 mg by mouth daily.           Marland Kitchen atenolol (TENORMIN) 25 MG tablet   Oral   Take 12.5 mg by mouth daily.           Marland Kitchen buPROPion (WELLBUTRIN XL) 300 MG 24 hr tablet   Oral   Take 300 mg by mouth daily.           . cholecalciferol (VITAMIN D) 1000 UNITS tablet   Oral   Take 5,000 Units by mouth daily.           . fish oil-omega-3 fatty acids 1000 MG capsule   Oral   Take 2 g by mouth daily.            BP 165/95  Pulse 63  Temp(Src) 97.1 F (36.2 C) (Oral)  Resp 20  Ht 5\' 5"  (1.651 m)  Wt 155 lb (70.308 kg)  BMI 25.79 kg/m2  SpO2 100% Physical Exam  Nursing note and vitals reviewed. Constitutional: She is oriented to person, place, and time. She appears well-developed and well-nourished. No distress.  HENT:  Head: Normocephalic and atraumatic.  Mouth/Throat: Oropharynx is clear and moist.  Eyes: Conjunctivae are normal. Pupils are equal, round, and reactive to light. No scleral icterus. Right eye exhibits nystagmus (leftward). Right eye exhibits normal extraocular motion. Left eye exhibits nystagmus. Left eye exhibits normal extraocular motion.  Neck: Neck supple.  Cardiovascular: Normal rate, regular rhythm, normal heart sounds and intact distal pulses.   No murmur heard. Pulmonary/Chest: Effort normal and breath sounds normal. No stridor. No respiratory distress. She has no rales.  Abdominal: Soft. Bowel sounds are normal. She exhibits no distension. There is no tenderness.  Musculoskeletal: Normal range of motion.  Neurological: She is alert and oriented to person, place, and time. She has normal strength. No cranial nerve deficit or sensory deficit. Coordination and gait normal. GCS eye subscore is  4. GCS verbal subscore is 5. GCS motor subscore is 6.  Reflex Scores:      Patellar reflexes are 2+ on the right side and 2+ on the left side. Responses slightly delayed.  Skin: Skin is warm and dry. No rash noted.  Psychiatric: She has a normal mood and affect. Her behavior is normal.    ED Course  Procedures (including critical care time) Labs Review Labs Reviewed - No data to display Imaging Review Ct Head Wo Contrast  12/19/2013   CLINICAL DATA Diplopia  EXAM CT HEAD WITHOUT CONTRAST  TECHNIQUE Contiguous axial images were obtained from the base of the skull through the vertex without intravenous contrast. Study was obtained within 24 hr of patient's arrival at the emergency department.  COMPARISON None.  FINDINGS The ventricles are normal in size and configuration. There is no mass, hemorrhage, extra-axial fluid collection, or midline shift. There is a focal area of decreased attenuation in the superior posterior right temporal lobe, seen on axial slice 15. An early infarct in this area must be question. Elsewhere, gray-white compartments appear normal. Bony calvarium appears intact. The mastoid air cells are clear. 1 no intraorbital lesions are appreciated on this study. 23  IMPRESSION Focal area of decreased attenuation in the posterior superior right temporal lobe. An early infarct in this area is questioned. Elsewhere gray-white compartments appear normal. There is no demonstrable hemorrhage or mass effect.  SIGNATURE  Electronically Signed   By: Lowella Grip M.D.   On: 12/19/2013 10:05   Mr Angiogram Head Wo Contrast  12/19/2013   CLINICAL DATA Slurred speech.  Abnormal eye movements.  Right facial droop.  EXAM MRI HEAD WITHOUT CONTRAST  MRA HEAD WITHOUT CONTRAST  TECHNIQUE Multiplanar, multiecho pulse sequences of the brain and surrounding structures were obtained without intravenous contrast. Angiographic images of the head were obtained using MRA technique without contrast.   COMPARISON CT head earlier today.  FINDINGS MRI HEAD FINDINGS  There is an acute left thalamic infarct without hemorrhage. This measures slightly greater than 1 cm in diameter. No significant mass effect. No associated hemorrhage.  Slight premature for age cerebral and cerebellar atrophy. No appreciable white matter disease. No foci of chronic hemorrhage. No large vessel or lacunar infarction.  Pituitary, pineal, and cerebellar tonsils unremarkable. No upper cervical lesions. Visualized calvarium, skull base, and upper cervical osseous structures unremarkable. Scalp and extracranial soft tissues, orbits, sinuses, and mastoids show no acute process.  MRA HEAD FINDINGS  Dolichoectatic but widely patent internal carotid arteries. Dolichoectatic and widely patent basilar artery. Left vertebral dominant. Right vertebral felt to be congenitally smaller, not diseased. No ACA or PCA stenosis. No MCA irregularity or  stenosis. Fetal origin right PCA. No evidence for basilar dissection. No visible intracranial aneurysm. Bulbous origin right superior cerebellar artery.  IMPRESSION Acute left thalamic infarct without hemorrhage, slightly greater than 1 cm in diameter. No evidence for previous lacunar infarction or significant white matter disease.  Mild premature atrophy.  No intracranial flow reducing lesion or dissection is observed.  SIGNATURE  Electronically Signed   By: Rolla Flatten M.D.   On: 12/19/2013 13:32   Mr Brain Wo Contrast  12/19/2013   CLINICAL DATA Slurred speech.  Abnormal eye movements.  Right facial droop.  EXAM MRI HEAD WITHOUT CONTRAST  MRA HEAD WITHOUT CONTRAST  TECHNIQUE Multiplanar, multiecho pulse sequences of the brain and surrounding structures were obtained without intravenous contrast. Angiographic images of the head were obtained using MRA technique without contrast.  COMPARISON CT head earlier today.  FINDINGS MRI HEAD FINDINGS  There is an acute left thalamic infarct without hemorrhage. This  measures slightly greater than 1 cm in diameter. No significant mass effect. No associated hemorrhage.  Slight premature for age cerebral and cerebellar atrophy. No appreciable white matter disease. No foci of chronic hemorrhage. No large vessel or lacunar infarction.  Pituitary, pineal, and cerebellar tonsils unremarkable. No upper cervical lesions. Visualized calvarium, skull base, and upper cervical osseous structures unremarkable. Scalp and extracranial soft tissues, orbits, sinuses, and mastoids show no acute process.  MRA HEAD FINDINGS  Dolichoectatic but widely patent internal carotid arteries. Dolichoectatic and widely patent basilar artery. Left vertebral dominant. Right vertebral felt to be congenitally smaller, not diseased. No ACA or PCA stenosis. No MCA irregularity or stenosis. Fetal origin right PCA. No evidence for basilar dissection. No visible intracranial aneurysm. Bulbous origin right superior cerebellar artery.  IMPRESSION Acute left thalamic infarct without hemorrhage, slightly greater than 1 cm in diameter. No evidence for previous lacunar infarction or significant white matter disease.  Mild premature atrophy.  No intracranial flow reducing lesion or dissection is observed.  SIGNATURE  Electronically Signed   By: Rolla Flatten M.D.   On: 12/19/2013 13:32  All radiology studies independently viewed by me.      EKG Interpretation   Date/Time:  Wednesday December 19 2013 08:38:56 EDT Ventricular Rate:  75 PR Interval:  174 QRS Duration: 84 QT Interval:  386 QTC Calculation: 431 R Axis:   42 Text Interpretation:  Normal sinus rhythm Normal ECG No old tracing to  compare Confirmed by [facility]  MD, TREY ([id number]) on 12/19/2013 8:54:41 AM      MDM   Final diagnoses:  Thalamic infarct, acute  CVA (cerebral infarction)    58 year old female presenting with double vision, which has completely resolved. Associated with a several minute episode of dysphasia, and somewhat more persistent  ataxia.  These symptoms have also resolved. Patient states that the only remaining symptom is being somewhat fuzzyheaded.  CT showed questionable infarct.  MR obtained and demonstrated acute thalamic infarct.  Discussed with neurology (Dr. Regenia Skeeter) and admitted by Internal Medicine (Dr. Tana Coast).    Houston Siren III, MD 12/20/13 [phone]

## 2013-12-19 NOTE — Progress Notes (Addendum)
Report received from the ED at [facility] and pt arrived on the unit at 1710. Pt A&O x4, NIH 0, denies pain, MAE x4, independent ambulation, Pt oriented to the unit, family remains at side. Call light within reach. IV intact and will continue to monitor quietly. Telemetry placed on pt.

## 2013-12-19 NOTE — ED Notes (Signed)
MD at bedside. 

## 2013-12-20 DIAGNOSIS — I359 Nonrheumatic aortic valve disorder, unspecified: Secondary | ICD-10-CM

## 2013-12-20 LAB — HEMOGLOBIN A1C
Hgb A1c MFr Bld: 5.7 % — ABNORMAL HIGH (ref <5.7)
MEAN PLASMA GLUCOSE: 117 mg/dL — AB (ref <117)

## 2013-12-20 LAB — LIPID PANEL
CHOL/HDL RATIO: 4.3 ratio
CHOLESTEROL: 243 mg/dL — AB (ref 0–200)
HDL: 57 mg/dL (ref >39)
LDL Cholesterol: 151 mg/dL — ABNORMAL HIGH (ref 0–99)
Triglycerides: 176 mg/dL — ABNORMAL HIGH (ref <150)
VLDL: 35 mg/dL (ref 0–40)

## 2013-12-20 LAB — TROPONIN I
Troponin I: <0.3 ng/mL (ref <0.30)
Troponin I: <0.3 ng/mL (ref <0.30)

## 2013-12-20 MED ORDER — NON FORMULARY: 5.0000 mg | Freq: Every day | Status: DC

## 2013-12-20 MED ORDER — SIMVASTATIN 20 MG PO TABS
20.0000 mg | ORAL_TABLET | Freq: Every day | ORAL | Status: DC
  Filled 2013-12-20: qty 1

## 2013-12-20 MED ORDER — ROSUVASTATIN CALCIUM 5 MG PO TABS
5.0000 mg | ORAL_TABLET | Freq: Every day | ORAL | Status: DC
  Filled 2013-12-20: qty 1

## 2013-12-20 MED ORDER — STUDY - INVESTIGATIONAL DRUG SIMPLE RECORD: 100.0000 mg | Freq: Every day | Status: DC

## 2013-12-20 MED ORDER — ROSUVASTATIN CALCIUM 5 MG PO TABS: 5.0000 mg | ORAL_TABLET | Freq: Every day | ORAL | Status: DC

## 2013-12-20 MED ORDER — STUDY - INVESTIGATIONAL DRUG SIMPLE RECORD: 300.0000 mg | Freq: Once | Status: DC

## 2013-12-20 MED ORDER — STUDY - INVESTIGATIONAL DRUG SIMPLE RECORD
100.0000 mg | Freq: Every day | Status: DC
  Administered 2013-12-20: 100 mg via ORAL
  Filled 2013-12-20 (×2): qty 100

## 2013-12-20 NOTE — Discharge Summary (Signed)
Physician Discharge Summary  Carol Harris [id number] DOB: [birth year] DOA: 12/19/2013  PCP: Wyatt Haste, MD  Admit date: 12/19/2013 Discharge date: 12/20/2013  Time spent: 65 minutes  Recommendations for Outpatient Follow-up:  1. Followup with Dr. Leonie Man in 2 months. 2. Followup with Wyatt Haste, MD In 1 week. On followup patient's blood pressure may to be reassessed. Patient has also been started on Crestor for hyperlipidemia and this will need to be followed up upon.  Discharge Diagnoses:  Principal Problem:   CVA (cerebral infarction) Active Problems:   HTN (hypertension), benign   Hyperlipidemia, mixed   Thalamic infarct, acute   GERD (gastroesophageal reflux disease)   Discharge Condition: Stable and improved  Diet recommendation: Heart healthy  Filed Weights   12/19/13 0838  Weight: 70.308 kg (155 lb)    History of present illness:  Patient is a 58 year old female with history of hypertension, hyperlipidemia, anxiety, GERD who is otherwise very functional, went to sleep at baseline last night around 10:30 PM. This morning, patient's arm was ringing and she did not wake up. Her husband workup about a 45 minutes later, around 6:40 AM, he noticed it she was slurring her speech and having difficulty finding words. She got up to use the bathroom and was drifting towards the right while walking. While looking in the mirror and in the bathroom, she noticed vertical diplopia. Patient's daughter also noticed slight right facial droop and abnormal eye movements which caused them are concerned and presented to the ED. She was not consider TPA candidate due to the late presentation. Patient underwent CT head which showed a focal area of decreased attenuation in the posterior superior right temporal lobe ? Early infarct.  MRI of the brain showed acute left thalamic infarct without hemorrhage. MRA showed no intracranial flow reducing lesion or dissection.  Patient  complaint of atypical chest pain with GI symptoms of acid reflux and heartburn in the past one week which has now improved.   Hospital Course:  #1 acute left thalamic infarct The patient was admitted with slurred speech and expressive aphasia as well as some vertical diplopia. Was also noticed that patient had a right facial droop. Patient was brought to the ED. Patient was not consider TPA candidate secondary to delayed presentation. CT of the head which was done showed decreased attenuation in the posterior right superior temporal lobe worrisome for early infarct there. MRI of the head showed an acute left thalamic infarct without hemorrhage. MRA of the head showed no intracranial flow reducing lesion or dissection. Patient improved clinically was admitted to the telemetry floor stroke workup undertaken. Neurology consultation was obtained and patient was followed by the stroke team during the hospitalization. 2-D echo which was done had no source of emboli. Carotid Dopplers which were done had no significant ICA stenosis. Fasting lipid panel which was done and LDL of 151. Patient was subsequently started on Crestor as she had an intolerance to statins in the past secondary to myalgias. Patient was seen by physical therapy occupational therapy and speech therapy. Patient was discharged home with speech therapy. Patient was started on the Socrates Trial and for secondary stroke prevention. Patient will followup with Dr. Leonie Man of neurology 2 months post discharge. Patient will be discharged in stable and improved condition.  #2 hyperlipidemia Fasting lipid panel which was done had a cholesterol of 243. LDL of 151. Patient had an intolerance to statins and a such patient was started on Crestor 5 mg daily. She'll need to followup  with PCP as outpatient.  #3 hypertension On admission progressive hypertension was allowed secondary to problem #1. Patient's antihypertensive medications were held. This will be  resumed on discharge.  #4 gastroesophageal reflux disease Patient was maintained on a proton pump inhibitor.  Procedures:  2 d echo : 12/20/13  Carotid dopplers 12/20/13  CT Head 12/19/13  MRI/MRA head 12/19/13  CXR 12/19/13  Consultations:  Neurology: Dr Doy Mince: 12/19/13  Discharge Exam: Filed Vitals:   12/20/13 1008  BP: 124/76  Pulse: 64  Temp: 97.9 F (36.6 C)  Resp: 18    General: NAD CardiovascuLAR: RRR Respiratory: CTAB  Discharge Instructions      Discharge Orders   Future Orders Complete By Expires   Diet - low sodium heart healthy  As directed    Discharge instructions  As directed    Comments:     Follow up with Dr Leonie Man in 2 months. Follow up with study protocol.   Increase activity slowly  As directed        Medication List         atenolol 25 MG tablet  Commonly known as:  TENORMIN  Take 12.5 mg by mouth 2 (two) times daily.     buPROPion 300 MG 24 hr tablet  Commonly known as:  WELLBUTRIN XL  Take 300 mg by mouth daily.     cholecalciferol 1000 UNITS tablet  Commonly known as:  VITAMIN D  Take 5,000 Units by mouth daily.     olmesartan 20 MG tablet  Commonly known as:  BENICAR  Take 10 mg by mouth daily.     rosuvastatin 5 MG tablet  Commonly known as:  CRESTOR  Take 1 tablet (5 mg total) by mouth daily at 6 PM.     vitamin C 1000 MG tablet  Take 1,000 mg by mouth 4 (four) times a week.       No Known Allergies Follow-up Information   Follow up with Forbes Cellar, MD. Schedule an appointment as soon as possible for a visit in 2 months.   Specialties:  Neurology, Radiology   Contact information:   [address] [place] [address] [phone]       Follow up with Wyatt Haste, MD. Schedule an appointment as soon as possible for a visit in 1 week.   Specialty:  Family Medicine   Contact information:   [place] [address] [phone]        The results of  significant diagnostics from this hospitalization (including imaging, microbiology, ancillary and laboratory) are listed below for reference.    Significant Diagnostic Studies: Dg Chest 2 View  12/20/2013   CLINICAL DATA Stroke symptoms  EXAM CHEST  2 VIEW  COMPARISON DG CHEST 2 VIEW dated 07/02/2009  FINDINGS The lungs are adequately inflated and clear. The cardiac silhouette is normal in size. The pulmonary vascularity is not engorged. The mediastinum is normal in width. Stable soft tissue density in the right cardiophrenic angle is consistent with a prominent epicardial fat pad. There is mild tortuosity of the descending thoracic aorta. There is no pleural effusion or pneumothorax.  IMPRESSION There is no evidence of active cardiopulmonary disease.  SIGNATURE  Electronically Signed   By: David  Martinique   On: 12/20/2013 07:57   Ct Head Wo Contrast  12/19/2013   CLINICAL DATA Diplopia  EXAM CT HEAD WITHOUT CONTRAST  TECHNIQUE Contiguous axial images were obtained from the base of the skull through the vertex without  intravenous contrast. Study was obtained within 24 hr of patient's arrival at the emergency department.  COMPARISON None.  FINDINGS The ventricles are normal in size and configuration. There is no mass, hemorrhage, extra-axial fluid collection, or midline shift. There is a focal area of decreased attenuation in the superior posterior right temporal lobe, seen on axial slice 15. An early infarct in this area must be question. Elsewhere, gray-white compartments appear normal. Bony calvarium appears intact. The mastoid air cells are clear. 1 no intraorbital lesions are appreciated on this study. 3  IMPRESSION Focal area of decreased attenuation in the posterior superior right temporal lobe. An early infarct in this area is questioned. Elsewhere gray-white compartments appear normal. There is no demonstrable hemorrhage or mass effect.  SIGNATURE  Electronically Signed   By: Lowella Grip M.D.    On: 12/19/2013 10:05   Mr Angiogram Head Wo Contrast  12/19/2013   CLINICAL DATA Slurred speech.  Abnormal eye movements.  Right facial droop.  EXAM MRI HEAD WITHOUT CONTRAST  MRA HEAD WITHOUT CONTRAST  TECHNIQUE Multiplanar, multiecho pulse sequences of the brain and surrounding structures were obtained without intravenous contrast. Angiographic images of the head were obtained using MRA technique without contrast.  COMPARISON CT head earlier today.  FINDINGS MRI HEAD FINDINGS  There is an acute left thalamic infarct without hemorrhage. This measures slightly greater than 1 cm in diameter. No significant mass effect. No associated hemorrhage.  Slight premature for age cerebral and cerebellar atrophy. No appreciable white matter disease. No foci of chronic hemorrhage. No large vessel or lacunar infarction.  Pituitary, pineal, and cerebellar tonsils unremarkable. No upper cervical lesions. Visualized calvarium, skull base, and upper cervical osseous structures unremarkable. Scalp and extracranial soft tissues, orbits, sinuses, and mastoids show no acute process.  MRA HEAD FINDINGS  Dolichoectatic but widely patent internal carotid arteries. Dolichoectatic and widely patent basilar artery. Left vertebral dominant. Right vertebral felt to be congenitally smaller, not diseased. No ACA or PCA stenosis. No MCA irregularity or stenosis. Fetal origin right PCA. No evidence for basilar dissection. No visible intracranial aneurysm. Bulbous origin right superior cerebellar artery.  IMPRESSION Acute left thalamic infarct without hemorrhage, slightly greater than 1 cm in diameter. No evidence for previous lacunar infarction or significant white matter disease.  Mild premature atrophy.  No intracranial flow reducing lesion or dissection is observed.  SIGNATURE  Electronically Signed   By: Rolla Flatten M.D.   On: 12/19/2013 13:32   Mr Brain Wo Contrast  12/19/2013   CLINICAL DATA Slurred speech.  Abnormal eye movements.   Right facial droop.  EXAM MRI HEAD WITHOUT CONTRAST  MRA HEAD WITHOUT CONTRAST  TECHNIQUE Multiplanar, multiecho pulse sequences of the brain and surrounding structures were obtained without intravenous contrast. Angiographic images of the head were obtained using MRA technique without contrast.  COMPARISON CT head earlier today.  FINDINGS MRI HEAD FINDINGS  There is an acute left thalamic infarct without hemorrhage. This measures slightly greater than 1 cm in diameter. No significant mass effect. No associated hemorrhage.  Slight premature for age cerebral and cerebellar atrophy. No appreciable white matter disease. No foci of chronic hemorrhage. No large vessel or lacunar infarction.  Pituitary, pineal, and cerebellar tonsils unremarkable. No upper cervical lesions. Visualized calvarium, skull base, and upper cervical osseous structures unremarkable. Scalp and extracranial soft tissues, orbits, sinuses, and mastoids show no acute process.  MRA HEAD FINDINGS  Dolichoectatic but widely patent internal carotid arteries. Dolichoectatic and widely patent basilar artery. Left vertebral  dominant. Right vertebral felt to be congenitally smaller, not diseased. No ACA or PCA stenosis. No MCA irregularity or stenosis. Fetal origin right PCA. No evidence for basilar dissection. No visible intracranial aneurysm. Bulbous origin right superior cerebellar artery.  IMPRESSION Acute left thalamic infarct without hemorrhage, slightly greater than 1 cm in diameter. No evidence for previous lacunar infarction or significant white matter disease.  Mild premature atrophy.  No intracranial flow reducing lesion or dissection is observed.  SIGNATURE  Electronically Signed   By: Rolla Flatten M.D.   On: 12/19/2013 13:32    Microbiology: No results found for this or any previous visit (from the past 240 hour(s)).   Labs: Basic Metabolic Panel:  Recent Labs Lab 12/19/13 0930  NA 137  K 4.2  CL 98  CO2 24  GLUCOSE 100*  BUN 15   CREATININE 0.67  CALCIUM 9.8   Liver Function Tests:  Recent Labs Lab 12/19/13 0930  AST 16  ALT 13  ALKPHOS 57  BILITOT 0.3  PROT 7.4  ALBUMIN 3.9   No results found for this basename: LIPASE, AMYLASE,  in the last 168 hours No results found for this basename: AMMONIA,  in the last 168 hours CBC:  Recent Labs Lab 12/19/13 0930  WBC 4.3  NEUTROABS 2.6  HGB 14.4  HCT 41.4  MCV 90.8  PLT 222   Cardiac Enzymes:  Recent Labs Lab 12/19/13 1948 12/19/13 2337 12/20/13 0547  TROPONINI <0.30 <0.30 <0.30   BNP: BNP (last 3 results) No results found for this basename: PROBNP,  in the last 8760 hours CBG: No results found for this basename: GLUCAP,  in the last 168 hours     Signed:  [facility] MD [facility] 12/20/2013, 2:51 PM

## 2013-12-20 NOTE — Progress Notes (Signed)
Stroke Team Progress Note  HISTORY Carol Harris is a 58 y.o. female who went to sleep at baseline 12/18/2013. Patient awoke 12/19/2013 and noted she had vertical diplopia and was drifting to the right. Family noted she had slurred words and some word finding difficulty. Over the course of one hour her symptoms of slurred speech seemed to clear and her vertical diplopia cleared but when she was reading she felt like the words were "bobbing". Her daughter is a Marine scientist and noted she had a slight right facial droop and some abnormal eye movements--this prompted them to come to the ED.   Date last known well: Date: 12/18/2013  Time last known well: Time: 22:30  tPA Given: No: Outside time window   SUBJECTIVE Family member present. Feeling better but not back to baseline. Discussed treatment of hyperlipidemia.  OBJECTIVE Most recent Vital Signs: Filed Vitals:   12/20/13 0214 12/20/13 0254 12/20/13 0416 12/20/13 0600  BP: 93/62 100/78 99/72 103/70  Pulse: 57  58 61  Temp: 97.2 F (36.2 C)  97.3 F (36.3 C) 97.7 F (36.5 C)  TempSrc: Axillary  Oral Oral  Resp: 16  16 14   Height:      Weight:      SpO2: 99%  97% 99%   CBG (last 3)  No results found for this basename: GLUCAP,  in the last 72 hours  IV Fluid Intake:   . sodium chloride 75 mL/hr at 12/19/13 1852    MEDICATIONS  . buPROPion  300 mg Oral Daily  . cholecalciferol  5,000 Units Oral Daily  . enoxaparin (LOVENOX) injection  40 mg Subcutaneous Q24H  . pantoprazole  40 mg Oral Daily  . Placebo / ticagrelor maintenance dose  90 mg Oral BID  . simvastatin  20 mg Oral q1800   PRN:  acetaminophen, acetaminophen, senna-docusate  Diet:  Cardiac  Thin liquids Activity:  Up with assistance DVT Prophylaxis:  Lovenox  CLINICALLY SIGNIFICANT STUDIES Basic Metabolic Panel:  Recent Labs Lab 12/19/13 0930  NA 137  K 4.2  CL 98  CO2 24  GLUCOSE 100*  BUN 15  CREATININE 0.67  CALCIUM 9.8   Liver Function Tests:   Recent Labs Lab 12/19/13 0930  AST 16  ALT 13  ALKPHOS 57  BILITOT 0.3  PROT 7.4  ALBUMIN 3.9   CBC:  Recent Labs Lab 12/19/13 0930  WBC 4.3  NEUTROABS 2.6  HGB 14.4  HCT 41.4  MCV 90.8  PLT 222   Coagulation: No results found for this basename: LABPROT, INR,  in the last 168 hours Cardiac Enzymes:  Recent Labs Lab 12/19/13 1948 12/19/13 2337 12/20/13 0547  TROPONINI <0.30 <0.30 <0.30   Urinalysis:  Recent Labs Lab 12/19/13 1044  COLORURINE YELLOW  LABSPEC 1.010  PHURINE 8.0  GLUCOSEU NEGATIVE  HGBUR NEGATIVE  BILIRUBINUR NEGATIVE  KETONESUR NEGATIVE  PROTEINUR NEGATIVE  UROBILINOGEN 0.2  NITRITE NEGATIVE  LEUKOCYTESUR NEGATIVE   Lipid Panel    Component Value Date/Time   CHOL 243* 12/20/2013 0542   TRIG 176* 12/20/2013 0542   HDL 57 12/20/2013 0542   CHOLHDL 4.3 12/20/2013 0542   VLDL 35 12/20/2013 0542   LDLCALC 151* 12/20/2013 0542   HgbA1C  No results found for this basename: HGBA1C    Urine Drug Screen:   No results found for this basename: labopia, cocainscrnur, labbenz, amphetmu, thcu, labbarb    Alcohol Level: No results found for this basename: ETH,  in the last 168 hours  Dg Chest  2 View 12/20/2013    There is no evidence of active cardiopulmonary disease.    Ct Head Wo Contrast 12/19/2013    Focal area of decreased attenuation in the posterior superior right temporal lobe. An early infarct in this area is questioned. Elsewhere gray-white compartments appear normal. There is no demonstrable hemorrhage or mass effect.     Mr Angiogram Head Wo Contrast 12/19/2013    Acute left thalamic infarct without hemorrhage, slightly greater than 1 cm in diameter. No evidence for previous lacunar infarction or significant white matter disease.  Mild premature atrophy.  No intracranial flow reducing lesion or dissection is observed.    2D Echocardiogram  EF 60 - 65%. No cardiac source of emboli identified.  Carotid Doppler  Preliminary report:  Bilateral: 1-39% ICA stenosis. Vertebral artery flow is antegrade.      EKG  normal sinus rhythm rate 75 beats per minute. For complete results please see formal report.   Therapy Recommendations No f/u therapies recommended.  Physical Exam    Mental Status:  Alert, oriented, thought content appropriate. Speech fluent with intermittent word finding difficulty. Able to follow 3 step commands without difficulty but has more difficulty with complex commands  Cranial Nerves:  II: Discs flat bilaterally; Visual fields grossly normal, pupils equal, round, reactive to light and accommodation  III,IV, VI: left ptosis, extra-ocular motions intact bilaterally-with horizontal movements she shows torsion al nystagmus most noted when she is looking to the left.  V,VII: smile symmetric but shows a slight right NL fold decrease, facial light touch sensation normal bilaterally  VIII: hearing normal bilaterally  IX,X: gag reflex present  XI: bilateral shoulder shrug  XII: midline tongue extension without atrophy or fasciculations  Motor:  Right : Upper extremity 5/5 Left: Upper extremity 5/5  Lower extremity 5/5 Lower extremity 5/5  Tone and bulk:normal tone throughout; no atrophy noted  Sensory: Pinprick and light touch intact throughout, bilaterally  Deep Tendon Reflexes:  Right: Upper Extremity Left: Upper extremity  biceps (C-5 to C-6) 2/4 biceps (C-5 to C-6) 2/4  tricep (C7) 2/4 triceps (C7) 2/4  Brachioradialis (C6) 2/4 Brachioradialis (C6) 2/4  Lower Extremity Lower Extremity  quadriceps (L-2 to L-4) 2/4 quadriceps (L-2 to L-4) 2/4  Achilles (S1) 2/4 Achilles (S1) 2/4  Plantars:  Right: downgoing Left: downgoing  Cerebellar:  normal finger-to-nose, normal heel-to-shin test  Gait: slightly unsteady but shows no romberg and tends to fall to the left with tandem gait.    ASSESSMENT Ms. Carol Harris is a 58 y.o. female presenting with diplopia, right facial droop, abnormal eye  movements, and slurred speech. TPA was not administered secondary to delay in arrival.  An MRI revealed an acute left thalamic infarct without hemorrhage, slightly greater than 1 cm in diameter. Infarct felt to be thrombotic secondary to small vessel disease. On no antithrombotics prior to admission. Now on Socrates Trial for secondary stroke prevention. Patient with resultant mild right facial weakness and abnormal eye movements . Stroke work up underway.   Hyperlipidemia - cholesterol 243; LDL 151 - simvastatin started - Pravachol in past  Hypertension history - now hypotensive - off antihypertensive medications.   Hospital day # 1  TREATMENT/PLAN  Continue Socrates study for secondary stroke prevention.  No F/U PT or OT  F/U Dr Leonie Man 2 mos.  [place] for discharge.   Mikey Bussing PA-C [facility] Pager [phone] 12/20/2013, 8:42 AM  I have personally obtained a history, examined the patient, evaluated imaging results,  and formulated the assessment and plan of care. I agree with the above. Antony Contras, MD  To contact Stroke Continuity provider, please refer to [email or web address] After hours, contact General Neurology

## 2013-12-20 NOTE — Evaluation (Addendum)
Speech Language Pathology Evaluation Patient Details Name: Carol Harris MRN: [id number] DOB: 12/12/1955 Today's Date: 12/20/2013 Time: 1210-1230 SLP Time Calculation (min): 20 min  Problem List:  Patient Active Problem List   Diagnosis Date Noted  . Thalamic infarct, acute 12/19/2013  . CVA (cerebral infarction) 12/19/2013  . GERD (gastroesophageal reflux disease) 12/19/2013  . Leukopenia 09/05/2011  . HTN (hypertension), benign 09/05/2011  . Hyperlipidemia, mixed 09/05/2011   Past Medical History:  Past Medical History  Diagnosis Date  . GERD (gastroesophageal reflux disease)   . Anxiety   . Colon polyp   . Leukopenia 09/05/2011  . Hyperlipidemia, mixed 09/05/2011  . HTN (hypertension), benign 09/05/2011   Past Surgical History: History reviewed. No pertinent past surgical history. HPI:  58 year old female with history of hypertension, hyperlipidemia, anxiety, GERD who is otherwise very functional, went to sleep at baseline last night around 10:30 PM. Husband she was slurring her speech and having difficulty finding words. She got up to use the bathroom and was drifting towards the right while walking. While looking in the mirror and in the bathroom, she noticed vertical diplopia. MRI of the brain showed acute left thalamic infarct without hemorrhage. MRA showed no intracranial flow reducing lesion or dissection.    Assessment / Plan / Recommendation Clinical Impression  Pt. and spouse state verbal expression has improved significantly from yesterday and she is approximately 90-95% back to baseline.  SLP noted mild hesitations, slightly decreased semantics and dysnomias during longer periords of connected speech in conversation.  She also reported (pt. forgot to mention to OT) visual disturbances in right field of vision after prolonged bouts of reading.  Cognitive abilities were within functional limits.  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  This SLP feels pt. would benefit from an assessment to further evaluate writing, higher level language abilities and education for compensatory strategies given her work responsibilities.  She would benefit from outpt., however may be a long waiting list (deficits may resolve) therefore [place] may be more appropriate (?).    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 (?))   Cognition  Overall Cognitive Status: Within Functional Limits for tasks assessed Orientation Level: Oriented X4 Safety/Judgment: Appears intact    Comprehension  Auditory Comprehension Overall Auditory Comprehension: Appears within functional limits for tasks assessed Visual Recognition/Discrimination Discrimination: Not tested Reading Comprehension Reading Status: Within funtional limits    Expression Expression Primary Mode of Expression: Verbal Verbal Expression Overall Verbal Expression: Impaired Initiation: No impairment Level of Generative/Spontaneous Verbalization: Conversation Repetition: No impairment Naming: No impairment Pragmatics: No impairment Other Verbal Expression Comments:  (pauses, hesitation, minimally dysnomic) Written Expression Dominant Hand: Right Written Expression: Not tested   Oral / Motor Oral Motor/Sensory Function Overall Oral Motor/Sensory Function: Appears within functional limits for tasks assessed Motor  Speech Overall Motor Speech: Appears within functional limits for tasks assessed Respiration: Within functional limits Phonation: Normal Resonance: Within functional  limits Articulation: Within functional limitis Intelligibility: Intelligible Motor Planning: Witnin functional limits   GO     Carol Harris M.Ed [employer] [phone]  12/20/2013

## 2013-12-20 NOTE — Evaluation (Signed)
Occupational Therapy Evaluation Patient Details Name: Carol Harris MRN: [id number] DOB: 1956/07/28 Today's Date: 12/20/2013 Time: 6948-5462 OT Time Calculation (min): 11 min  OT Assessment / Plan / Recommendation History of present illness patient is a 58 yo female s/p small left thalamic infarct.   Clinical Impression   Pt presents to OT at baseline.  Pt feels all deficits resolved.    OT Assessment  Patient does not need any further OT services    Follow Up Recommendations  No OT follow up                Precautions / Restrictions Precautions Precautions: None       ADL  Transfers/Ambulation Related to ADLs: Pt overall I with ADL activity. No deficits noted. Pt agrees      OT Goals(Current goals can be found in the care plan section) Acute Rehab OT Goals Patient Stated Goal: to go home  Visit Information  Assistance Needed: +1 History of Present Illness: patient is a 58 yo female s/p small left thalamic infarct.       Prior [place] expects to be discharged to:: Private residence Living Arrangements: Spouse/significant other Available Help at Discharge: Family Type of Home: House Home Equipment: None Prior Function Level of Independence: Independent Communication Communication: No difficulties Dominant Hand: Right         Vision/Perception Vision - Assessment Additional Comments: Pts double vision and nystagmus has resolved.  No deficits noted with reading and functional activity   Cognition  Cognition Arousal/Alertness: Awake/alert Behavior During Therapy: WFL for tasks assessed/performed Overall Cognitive Status: Within Functional Limits for tasks assessed    Extremity/Trunk Assessment Upper Extremity Assessment Upper Extremity Assessment: Overall WFL for tasks assessed;Difficult to assess due to impaired cognition (Pt reported tremor occasionally in R hand.  Did not see tremor during OT eval) Lower  Extremity Assessment Lower Extremity Assessment: Overall WFL for tasks assessed Cervical / Trunk Assessment Cervical / Trunk Assessment: Normal     Mobility Transfers Overall transfer level: Independent Equipment used: None        Balance Balance Overall balance assessment: Independent Standardized Balance Assessment Standardized Balance Assessment : Dynamic Gait Index Dynamic Gait Index Level Surface: Normal Change in Gait Speed: Normal Gait with Horizontal Head Turns: Mild Impairment Gait with Vertical Head Turns: Normal Gait and Pivot Turn: Normal Step Over Obstacle: Mild Impairment Step Around Obstacles: Normal Steps: Normal Total Score: 22   End of Session OT - End of Session Activity Tolerance: Patient tolerated treatment well Patient left: in chair  GO     [place], Thereasa Parkin 12/20/2013, 11:21 AM

## 2013-12-20 NOTE — Progress Notes (Signed)
PT Cancellation Note  Patient Details Name: Carol Harris MRN: [id number] DOB: 02/15/1956   Cancelled Treatment:    Reason Eval/Treat Not Completed: Patient at procedure or test/unavailable   Duncan Dull 12/20/2013, 8:54 AM Alben Deeds, PT DPT  [phone]

## 2013-12-20 NOTE — Progress Notes (Addendum)
Pt A&O x4, pt discharge education and instructions completed with pt and family at side. All denies any questions and voices understanding. Pt provided print out and pamphlets materials on "Stroke and Cholesterol". All pt lines including IV and telemetry removed from pt. Pt provided her clinical trial medication from pharmacy. Pt ambulated off unit with family and belongings at side.

## 2013-12-20 NOTE — Evaluation (Signed)
Physical Therapy Evaluation Patient Details Name: Carol Harris MRN: [id number] DOB: 11-Oct-1956 Today's Date: 12/20/2013 Time: 3235-5732 PT Time Calculation (min): 14 min  PT Assessment / Plan / Recommendation History of Present Illness  patient is a 58 yo female s/p small left thalamic infarct.  Clinical Impression  No acute needs, Pt will sign off.  (OF NOTE: PATIENT WITH LUE TREMOR DURING MOBILITY TASKS)    PT Assessment  Patent does not need any further PT services    Follow Up Recommendations  No PT follow up    Does the patient have the potential to tolerate intense rehabilitation      Barriers to Discharge        Equipment Recommendations  None recommended by PT    Recommendations for Other Services     Frequency      Precautions / Restrictions     Pertinent Vitals/Pain No pain      Mobility  Transfers Overall transfer level: Independent Equipment used: None Ambulation/Gait Ambulation/Gait assistance: Independent Ambulation Distance (Feet): 840 Feet Assistive device: None Stairs: Yes Stairs assistance: Independent Stair Management: One rail Right Number of Stairs: 6 Modified Rankin (Stroke Patients Only) Pre-Morbid Rankin Score: No symptoms Modified Rankin: Moderate disability    Exercises     PT Diagnosis:    PT Problem List:   PT Treatment Interventions:       PT Goals(Current goals can be found in the care plan section) Acute Rehab PT Goals Patient Stated Goal: to go home PT Goal Formulation: No goals set, d/c therapy  Visit Information  Last PT Received On: 12/20/13 Assistance Needed: +1 Reason Eval/Treat Not Completed: Patient at procedure or test/unavailable History of Present Illness: patient is a 58 yo female s/p small left thalamic infarct.       Prior [place] expects to be discharged to:: Private residence Living Arrangements: Spouse/significant other Available Help at Discharge:  Family Type of Home: [place]: None Prior Function Level of Independence: Independent Communication Communication: No difficulties Dominant Hand: Right    Cognition  Cognition Arousal/Alertness: Awake/alert Behavior During Therapy: WFL for tasks assessed/performed Overall Cognitive Status: Within Functional Limits for tasks assessed    Extremity/Trunk Assessment Upper Extremity Assessment Upper Extremity Assessment:  (noted Left hand tremor at rest) Lower Extremity Assessment Lower Extremity Assessment: Overall WFL for tasks assessed Cervical / Trunk Assessment Cervical / Trunk Assessment: Normal   Balance Balance Overall balance assessment: Independent Standardized Balance Assessment Standardized Balance Assessment : Dynamic Gait Index Dynamic Gait Index Level Surface: Normal Change in Gait Speed: Normal Gait with Horizontal Head Turns: Mild Impairment Gait with Vertical Head Turns: Normal Gait and Pivot Turn: Normal Step Over Obstacle: Mild Impairment Step Around Obstacles: Normal Steps: Normal Total Score: 22  End of Session PT - End of Session Equipment Utilized During Treatment: Gait belt Activity Tolerance: Patient tolerated treatment well Patient left: in chair;with family/visitor present Nurse Communication: Mobility status  GP     Duncan Dull 12/20/2013, 10:49 AM Alben Deeds, PT DPT  [phone]

## 2013-12-20 NOTE — Progress Notes (Signed)
Nutrition Brief Note  Malnutrition Screening Tool result is inaccurate.  Please consult if nutrition needs are identified.  Pryor Ochoa RD, LDN Pager: [phone]

## 2013-12-20 NOTE — Care Management Note (Signed)
    Page 1 of 1   12/20/2013     3:39:49 PM   CARE MANAGEMENT NOTE 12/20/2013  Patient:  Carol Harris, Carol Harris   Account Number:  [id number]  Date Initiated:  12/20/2013  Documentation initiated by:  Lorne Skeens  Subjective/Objective Assessment:   Patient admitted with CVA. Lives at home with husband     Action/Plan:   Will follow for discharge needs   Anticipated DC Date:  12/20/2013   Anticipated DC Plan:  [place]  CM consult      Choice offered to / List presented to:  C-1 Patient        [place] arranged  [place].   Status of service:  Completed, signed off Medicare Important Message given?   (If response is "NO", the following Medicare IM given date fields will be blank) Date Medicare IM given:   Date Additional Medicare IM given:    Discharge Disposition:  [place]  Per UR Regulation:  Reviewed for med. necessity/level of care/duration of stay  If discussed at [place] of Stay Meetings, dates discussed:    Comments:  12/20/13 [place], MSN, CM- Met with patient to discuss home health needs. Patient is agreeable to [place] and has chosen [facility]. Mary with [facility] was notified and has accepted the referral.

## 2013-12-20 NOTE — Progress Notes (Signed)
UR complete.  Caprisha Bridgett RN, MSN 

## 2013-12-20 NOTE — Progress Notes (Signed)
VASCULAR LAB PRELIMINARY  PRELIMINARY  PRELIMINARY  PRELIMINARY  Carotid duplex  completed.    Preliminary report:  Bilateral:  1-39% ICA stenosis.  Vertebral artery flow is antegrade.      Diana Armijo, RVT 12/20/2013, 9:25 AM

## 2013-12-20 NOTE — Progress Notes (Signed)
Echo Lab  2D Echocardiogram completed.  [place], RDCS 12/20/2013 9:30 AM

## 2014-01-03 ENCOUNTER — Encounter: Payer: Self-pay | Admitting: Internal Medicine

## 2014-01-03 LAB — HM MAMMOGRAPHY

## 2014-01-08 ENCOUNTER — Encounter: Payer: Self-pay | Admitting: Family Medicine

## 2014-01-08 ENCOUNTER — Ambulatory Visit (INDEPENDENT_AMBULATORY_CARE_PROVIDER_SITE_OTHER): Payer: BC Managed Care – PPO | Admitting: Family Medicine

## 2014-01-08 ENCOUNTER — Telehealth: Payer: Self-pay | Admitting: Internal Medicine

## 2014-01-08 VITALS — BP 110/90 | HR 60 | Wt 154.0 lb

## 2014-01-08 DIAGNOSIS — E782 Mixed hyperlipidemia: Secondary | ICD-10-CM

## 2014-01-08 DIAGNOSIS — I6350 Cerebral infarction due to unspecified occlusion or stenosis of unspecified cerebral artery: Secondary | ICD-10-CM

## 2014-01-08 DIAGNOSIS — I639 Cerebral infarction, unspecified: Secondary | ICD-10-CM

## 2014-01-08 DIAGNOSIS — I10 Essential (primary) hypertension: Secondary | ICD-10-CM

## 2014-01-08 DIAGNOSIS — G4733 Obstructive sleep apnea (adult) (pediatric): Secondary | ICD-10-CM | POA: Insufficient documentation

## 2014-01-08 DIAGNOSIS — I6381 Other cerebral infarction due to occlusion or stenosis of small artery: Secondary | ICD-10-CM

## 2014-01-08 NOTE — Progress Notes (Signed)
   Subjective:    Patient ID: Carol Harris, female    DOB: 10/16/1955, 58 y.o.   MRN: [id number]  HPI He is here for followup on recent hospitalization for CVA. She was given Crestor in the hospital to help with her lipids however her insurance apparently won't cover this. She has a previous history of difficulty with Lipitor and Zocor both causing muscle aches. She had been on Pravachol prior to the hospitalization. Review of the hospital lab data showed an LDL of in the 150 range. She was in the hospital for 2 days. Her speech has essentially returned to normal. She still does feel as if she is listing to the right although she does have good strength. She also apparently was diagnosed with obstructive sleep apnea. This was ordered through her cardiologist. She is not sure why she was seeing a cardiologist.   Review of Systems     Objective:   Physical Exam Alert and in no distress. Normal motor, sensory and DTRs. No speech deficit is noted.       Assessment & Plan:  CVA (cerebral infarction) - Plan: Ambulatory referral to Physical Therapy  HTN (hypertension), benign  Hyperlipidemia, mixed  Thalamic infarct, acute  Obstructive sleep apnea  I will try to get a CPAP readout. Gave her a card to get her Crestor at a reduced rate and told her to call me if she had any difficulty with this. Refer to physical therapy for consult concerning any help they may give. Although her strength is normal she feels as if she is falling to the right.

## 2014-01-08 NOTE — Telephone Encounter (Signed)
Faxed over medical release form to [employer] in [place]. Fax # [phone]

## 2014-03-19 ENCOUNTER — Encounter (INDEPENDENT_AMBULATORY_CARE_PROVIDER_SITE_OTHER): Payer: Self-pay

## 2014-03-19 DIAGNOSIS — Z0289 Encounter for other administrative examinations: Secondary | ICD-10-CM

## 2014-06-19 ENCOUNTER — Ambulatory Visit: Payer: Self-pay | Admitting: Nurse Practitioner

## 2014-06-19 ENCOUNTER — Telehealth: Payer: Self-pay | Admitting: Nurse Practitioner

## 2014-06-19 NOTE — Telephone Encounter (Signed)
Patient was no show for today's office appointment.  

## 2014-07-09 ENCOUNTER — Ambulatory Visit: Payer: Self-pay | Admitting: Nurse Practitioner

## 2014-07-12 ENCOUNTER — Encounter (INDEPENDENT_AMBULATORY_CARE_PROVIDER_SITE_OTHER): Payer: Self-pay

## 2014-07-12 ENCOUNTER — Ambulatory Visit (INDEPENDENT_AMBULATORY_CARE_PROVIDER_SITE_OTHER): Payer: BC Managed Care – PPO | Admitting: Nurse Practitioner

## 2014-07-12 ENCOUNTER — Encounter: Payer: Self-pay | Admitting: Nurse Practitioner

## 2014-07-12 VITALS — BP 121/79 | HR 68 | Temp 99.1°F | Ht 65.0 in | Wt 158.0 lb

## 2014-07-12 DIAGNOSIS — I639 Cerebral infarction, unspecified: Secondary | ICD-10-CM

## 2014-07-12 DIAGNOSIS — Z8673 Personal history of transient ischemic attack (TIA), and cerebral infarction without residual deficits: Secondary | ICD-10-CM | POA: Insufficient documentation

## 2014-07-12 DIAGNOSIS — I6381 Other cerebral infarction due to occlusion or stenosis of small artery: Secondary | ICD-10-CM

## 2014-07-12 NOTE — Progress Notes (Signed)
PATIENT: Carol Harris DOB: Mar 10, 1956  REASON FOR VISIT: hospital follow up for stroke HISTORY FROM: patient  HISTORY OF PRESENT ILLNESS: Carol Harris is an 58 y.o. Caucasian female who comes to the office for first hospital follow up post hospital discharge for stroke. Patient awoke on 12/18/13 and noted she had vertical diplopia and was drifting to the right. Family noted she had slurred words and some word finding difficulty. Over the course of one hour her symptoms of slurred speech seemed to clear and her vertical diplopia cleared but when she was reading she felt like the words were "bobbing". Her daughter is a Marine scientist and noted she had a slight right facial droop and some abnormal eye movements--this prompted them to come to the ED. MRI showed an acute left thalamic infarct. No intracranial flow reducing lesion or dissection was observed.  2D Echocardiogram showed an EF 60 - 65%. No cardiac source of emboli identified.  Carotid Doppler showed bilateral 1-39% ICA stenosis. PMH of hyperlipidemia, cholesterol 243; LDL 151, started on pravastatin.  Hgb a1c was 5.7, not diabetic. She completed a few sessions of outpatient PT and feels she has no residual deficits. She completed the Socrates trial and has been switched to Plavix.  She is tolerating Plavix well with no signs of significant bleeding or bruising. She has not had repeat lipid panel but plans to do so at PCP in the next 2 weeks.  REVIEW OF SYSTEMS: Full 14 system review of systems performed and notable only for:  Leg swelling and trouble swallowing  ALLERGIES: No Known Allergies  HOME MEDICATIONS: Outpatient Prescriptions Prior to Visit  Medication Sig Dispense Refill  . Ascorbic Acid (VITAMIN C) 1000 MG tablet Take 1,000 mg by mouth 4 (four) times a week.      Marland Kitchen atenolol (TENORMIN) 25 MG tablet Take 12.5 mg by mouth 2 (two) times daily.       Marland Kitchen buPROPion (WELLBUTRIN XL) 300 MG 24 hr tablet Take 300 mg by mouth daily.         . cholecalciferol (VITAMIN D) 1000 UNITS tablet Take 5,000 Units by mouth daily.        Marland Kitchen olmesartan (BENICAR) 20 MG tablet Take 10 mg by mouth daily.       No facility-administered medications prior to visit.    PHYSICAL EXAM Filed Vitals:   07/12/14 0828  BP: 121/79  Pulse: 68  Temp: 99.1 F (37.3 C)  TempSrc: Oral  Height: 5\' 5"  (1.651 m)  Weight: 158 lb (71.668 kg)   Body mass index is 26.29 kg/(m^2).  Generalized: Well developed, in no acute distress  Head: normocephalic and atraumatic. Oropharynx benign  Neck: Supple, no carotid bruits  Cardiac: Regular rate rhythm, no murmur  Musculoskeletal: No deformity   Neurological examination  Mentation: Alert oriented to time, place, history taking. Follows all commands speech and language fluent Cranial nerve II-XII: Fundoscopic exam not done. Pupils were equal round reactive to light extraocular movements were full, visual field were full on confrontational test. Facial sensation and strength were normal. hearing was intact to finger rubbing bilaterally. Uvula tongue midline. head turning and shoulder shrug and were normal and symmetric.Tongue protrusion into cheek strength was normal. Motor: The motor testing reveals 5 over 5 strength of all 4 extremities. Good symmetric motor tone is noted throughout.  Sensory: Sensory testing is intact to soft touch on all 4 extremities. No evidence of extinction is noted.  Coordination: Cerebellar testing reveals good finger-nose-finger  and heel-to-shin bilaterally.  Gait and station: Gait is normal. Tandem gait is normal. Romberg is negative. Reflexes: Deep tendon reflexes are symmetric and normal bilaterally.  NIHSS: 0 MRs: 0  ASSESSMENT: Carol Harris is a 58 y.o. female presenting with diplopia, right facial droop, abnormal eye movements, and slurred speech. An MRI revealed an acute left thalamic infarct without hemorrhage, slightly greater than 1 cm in diameter. Infarct  felt to be thrombotic secondary to small vessel disease. Enrolled in Socrates Trial for secondary stroke prevention. Patient with resolution of deficits.  PLAN: I discussed with patient and family regarding her recent strokes, discussed results of evaluation in the hospital and answered questions. Continue clopidogrel 75 mg orally every day for secondary stroke prevention and maintain strict control of hypertension with blood pressure goal below 130/90, and lipids with LDL cholesterol goal below 100 mg/dL. Please see your primary physician, Dr. Jill Alexanders for followup cholesterol screening.  Followup in the future in 6 months, sooner as needed.  Rudi Rummage LAM, MSN, FNP-BC, A/GNP-C 07/12/2014, 8:32 AM [facility] [address], [place], [place] [address] [phone]  Note: This document was prepared with digital dictation and possible smart phrase technology. Any transcriptional errors that result from this process are unintentional.

## 2014-07-12 NOTE — Progress Notes (Signed)
I agree with the above plan 

## 2014-07-12 NOTE — Patient Instructions (Signed)
Continue clopidogrel 75 mg orally every day for secondary stroke prevention and maintain strict control of hypertension with blood pressure goal below 130/90, and lipids with LDL cholesterol goal below 100 mg/dL. Followup in the future in 6 months, sooner as needed.   Stroke Prevention Some medical conditions and behaviors are associated with an increased chance of having a stroke. You may prevent a stroke by making healthy choices and managing medical conditions. HOW CAN I REDUCE MY RISK OF HAVING A STROKE?   Stay physically active. Get at least 30 minutes of activity on most or all days.  Do not smoke. It may also be helpful to avoid exposure to secondhand smoke.  Limit alcohol use. Moderate alcohol use is considered to be:  No more than 2 drinks per day for men.  No more than 1 drink per day for nonpregnant women.  Eat healthy foods. This involves:  Eating 5 or more servings of fruits and vegetables a day.  Making dietary changes that address high blood pressure (hypertension), high cholesterol, diabetes, or obesity.  Manage your cholesterol levels.  Making food choices that are high in fiber and low in saturated fat, trans fat, and cholesterol may control cholesterol levels.  Take any prescribed medicines to control cholesterol as directed by your health care provider.  Manage your diabetes.  Controlling your carbohydrate and sugar intake is recommended to manage diabetes.  Take any prescribed medicines to control diabetes as directed by your health care provider.  Control your hypertension.  Making food choices that are low in salt (sodium), saturated fat, trans fat, and cholesterol is recommended to manage hypertension.  Take any prescribed medicines to control hypertension as directed by your health care provider.  Maintain a healthy weight.  Reducing calorie intake and making food choices that are low in sodium, saturated fat, trans fat, and cholesterol are  recommended to manage weight.  Stop drug abuse.  Avoid taking birth control pills.  Talk to your health care provider about the risks of taking birth control pills if you are over 54 years old, smoke, get migraines, or have ever had a blood clot.  Get evaluated for sleep disorders (sleep apnea).  Talk to your health care provider about getting a sleep evaluation if you snore a lot or have excessive sleepiness.  Take medicines only as directed by your health care provider.  For some people, aspirin or blood thinners (anticoagulants) are helpful in reducing the risk of forming abnormal blood clots that can lead to stroke. If you have the irregular heart rhythm of atrial fibrillation, you should be on a blood thinner unless there is a good reason you cannot take them.  Understand all your medicine instructions.  Make sure that other conditions (such as anemia or atherosclerosis) are addressed. SEEK IMMEDIATE MEDICAL CARE IF:   You have sudden weakness or numbness of the face, arm, or leg, especially on one side of the body.  Your face or eyelid droops to one side.  You have sudden confusion.  You have trouble speaking (aphasia) or understanding.  You have sudden trouble seeing in one or both eyes.  You have sudden trouble walking.  You have dizziness.  You have a loss of balance or coordination.  You have a sudden, severe headache with no known cause.  You have new chest pain or an irregular heartbeat. Any of these symptoms may represent a serious problem that is an emergency. Do not wait to see if the symptoms will go away.  Get medical help at once. Call your local emergency services (911 in [place] Do not drive yourself to the hospital. Document Released: 11/04/2004 Document Revised: 02/11/2014 Document Reviewed: 03/30/2013 [facility] Patient Information 2015 [place], [place]. This information is not intended to replace advice given to you by your health care provider. Make sure  you discuss any questions you have with your health care provider.

## 2014-08-27 ENCOUNTER — Encounter: Payer: Self-pay | Admitting: Neurology

## 2014-09-09 ENCOUNTER — Other Ambulatory Visit: Payer: Self-pay | Admitting: Neurology

## 2014-11-15 ENCOUNTER — Telehealth: Payer: Self-pay | Admitting: Neurology

## 2014-11-15 NOTE — Telephone Encounter (Signed)
Patient scheduled for Colonoscopy on 2/23 and questioning when should she discontinue Rx clopidogrel (PLAVIX) 75 MG tablet.  Please call and advise.

## 2014-11-17 NOTE — Telephone Encounter (Signed)
Okay to hold Plavix 5 days prior to the procedure and resuming after the procedure when safe with a small but acceptable preprocedure risk of TIA/stroke.

## 2014-11-18 NOTE — Telephone Encounter (Signed)
Called and spoke to patient relayed about Plavix and told her we did get fax form.

## 2014-12-03 LAB — HM COLONOSCOPY: HM Colonoscopy: NORMAL

## 2014-12-05 ENCOUNTER — Encounter: Payer: Self-pay | Admitting: Internal Medicine

## 2015-01-14 ENCOUNTER — Ambulatory Visit: Payer: BC Managed Care – PPO | Admitting: Nurse Practitioner

## 2015-01-20 ENCOUNTER — Ambulatory Visit: Payer: BC Managed Care – PPO | Admitting: Adult Health

## 2015-01-23 ENCOUNTER — Telehealth: Payer: Self-pay

## 2015-01-23 NOTE — Telephone Encounter (Signed)
Patient is fine with seeing Hoyle Sauer.

## 2015-01-23 NOTE — Telephone Encounter (Signed)
Called patient and spoke to patient she is on Megan's schedule please schedule with Hoyle Sauer due to Megan's LOA.

## 2015-01-24 ENCOUNTER — Ambulatory Visit (INDEPENDENT_AMBULATORY_CARE_PROVIDER_SITE_OTHER): Payer: BLUE CROSS/BLUE SHIELD | Admitting: Nurse Practitioner

## 2015-01-24 ENCOUNTER — Encounter: Payer: Self-pay | Admitting: Nurse Practitioner

## 2015-01-24 VITALS — BP 117/69 | HR 57 | Ht 65.0 in | Wt 163.2 lb

## 2015-01-24 DIAGNOSIS — I639 Cerebral infarction, unspecified: Secondary | ICD-10-CM

## 2015-01-24 DIAGNOSIS — I6330 Cerebral infarction due to thrombosis of unspecified cerebral artery: Secondary | ICD-10-CM | POA: Diagnosis not present

## 2015-01-24 DIAGNOSIS — I10 Essential (primary) hypertension: Secondary | ICD-10-CM

## 2015-01-24 DIAGNOSIS — I6381 Other cerebral infarction due to occlusion or stenosis of small artery: Secondary | ICD-10-CM

## 2015-01-24 DIAGNOSIS — E782 Mixed hyperlipidemia: Secondary | ICD-10-CM

## 2015-01-24 MED ORDER — CLOPIDOGREL BISULFATE 75 MG PO TABS: 75.0000 mg | ORAL_TABLET | Freq: Every day | ORAL | Status: DC

## 2015-01-24 NOTE — Progress Notes (Signed)
[facility]  PATIENT: Carol Harris DOB: Aug 06, 1956   REASON FOR VISIT: for stroke HISTORY FROM: Patient    HISTORY OF PRESENT ILLNESS:Ms.  Harris is an 59 y.o. Caucasian female who comes to the office for  follow up. She has a history of stroke and was last seen in the office 07/12/2014. Patient awoke on 12/18/13 and noted she had vertical diplopia and was drifting to the right. Family noted she had slurred words and some word finding difficulty. Over the course of one hour her symptoms of slurred speech seemed to clear and her vertical diplopia cleared but when she was reading she felt like the words were "bobbing". Her daughter is a Marine scientist and noted she had a slight right facial droop and some abnormal eye movements--this prompted them to come to the ED. MRI showed an acute left thalamic infarct. No intracranial flow reducing lesion or dissection was observed. 2D Echocardiogram showed an EF 60 - 65%. No cardiac source of emboli identified. Carotid Doppler showed bilateral 1-39% ICA stenosis. PMH of hyperlipidemia, cholesterol 243; LDL 151, started on pravastatin. Hgb a1c was 5.7, not diabetic. She completed a few sessions of outpatient PT and feels she has no residual deficits. She completed the Socrates trial and has been switched to Plavix. She is tolerating Plavix well with no signs of significant bleeding or bruising. She has been switched from pravastatin to Lipitor with better reduction of  cholesterol according to the patient. She has no other symptoms of stroke or TIA. She has multiple questions about" having another stroke".    REVIEW OF SYSTEMS: Full 14 system review of systems performed and notable only for those listed, all others are neg:  Constitutional: neg  Cardiovascular: neg Ear/Nose/Throat: neg  Skin: neg Eyes: neg Respiratory: neg Gastroitestinal: neg  Hematology/Lymphatic: neg  Endocrine: neg Musculoskeletal:neg Allergy/Immunology:  neg Neurological: neg Psychiatric: neg Sleep : neg   ALLERGIES: No Known Allergies  HOME MEDICATIONS: Outpatient Prescriptions Prior to Visit  Medication Sig Dispense Refill  . atenolol (TENORMIN) 25 MG tablet Take 25 mg by mouth 2 (two) times daily.     . cholecalciferol (VITAMIN D) 1000 UNITS tablet Take 5,000 Units by mouth daily.      . clopidogrel (PLAVIX) 75 MG tablet TAKE 1 TABLET BY MOUTH ONCE DAILY 90 tablet 1  . losartan (COZAAR) 50 MG tablet     . pravastatin (PRAVACHOL) 40 MG tablet daily    . buPROPion (WELLBUTRIN XL) 300 MG 24 hr tablet Take 200 mg by mouth daily.      No facility-administered medications prior to visit.    PAST MEDICAL HISTORY: Past Medical History  Diagnosis Date  . GERD (gastroesophageal reflux disease)   . Anxiety   . Colon polyp   . Leukopenia 09/05/2011  . Hyperlipidemia, mixed 09/05/2011  . HTN (hypertension), benign 09/05/2011    PAST SURGICAL HISTORY: History reviewed. No pertinent past surgical history.  FAMILY HISTORY: Family History  Problem Relation Age of Onset  . Hypertension Father   . Hyperlipidemia Father   . Hypertension Mother     SOCIAL HISTORY: History   Social History  . Marital Status: Married    Spouse Name: Darnell Level  . Number of Children: 3  . Years of Education: BS   Occupational History  .      [employer]    Social History Main Topics  . Smoking status: Never Smoker   . Smokeless tobacco: Never Used  . Alcohol Use: 0.5 oz/week  1 drink(s) per week     Comment: occas.  . Drug Use: No  . Sexual Activity: Not on file   Other Topics Concern  . Not on file   Social History Narrative   Patient is right handed and consumes 1-2 caffienated beverages daily     PHYSICAL EXAM  Filed Vitals:   01/24/15 0943  BP: 117/69  Pulse: 57  Height: 5\' 5"  (1.651 m)  Weight: 163 lb 3.2 oz (74.027 kg)   Body mass index is 27.16 kg/(m^2).  Generalized: Well developed, in no acute distress  Head:  normocephalic and atraumatic,. Oropharynx benign  Neck: Supple, no carotid bruits  Cardiac: Regular rate rhythm, no murmur  Musculoskeletal: No deformity   Neurological examination   Mentation: Alert oriented to time, place, history taking. Attention span and concentration appropriate. Recent and remote memory intact.  Follows all commands speech and language fluent.   Cranial nerve II-XII: Pupils were equal round reactive to light extraocular movements were full, visual field were full on confrontational test. Facial sensation and strength were normal. hearing was intact to finger rubbing bilaterally. Uvula tongue midline. head turning and shoulder shrug were normal and symmetric.Tongue protrusion into cheek strength was normal. Motor: normal bulk and tone, full strength in the BUE, BLE, fine finger movements normal, no pronator drift. No focal weakness Sensory: normal and symmetric to light touch, pinprick, and  Vibration, proprioception  Coordination: finger-nose-finger, heel-to-shin bilaterally, no dysmetria Reflexes: Brachioradialis 2/2, biceps 2/2, triceps 2/2, patellar 2/2, Achilles 2/2, plantar responses were flexor bilaterally. Gait and Station: Rising up from seated position without assistance, normal stance,  moderate stride, good arm swing, smooth turning, able to perform tiptoe, and heel walking without difficulty. Tandem gait is steady  DIAGNOSTIC DATA (LABS, IMAGING, TESTING) -   ASSESSMENT AND PLAN  59 y.o. year old female  has a past medical history of  Anxiety;  Hyperlipidemia, mixed (09/05/2011); and HTN (hypertension), benign and left thalmic infarct without hemorrhage secondary to small vessel disease. Risk factors of hypertension, hyperlipidemia  Continue Plavix for secondary stroke prevention Strict control of blood pressure with goal below 130/90 today's reading 117/69 LDL cholesterol below 100 This is followed by PCP Exercises by walking 4-5 times weekly for  overall health and well-being I spent 82minutes extra in total face to face time with the patient more than 50% of which was spent counseling and coordination of care, reviewing test results reviewing medications and discussing and reviewing the diagnosis of stroke and the importance of risk factor control.  Follow-up in 6 months Vst time 30 min Dennie Bible, [facility], [facility], APRN  [facility] [address], [place], [place] [address] [phone]

## 2015-01-24 NOTE — Patient Instructions (Addendum)
Continue Plavix for secondary stroke prevention Strict control of blood pressure with goal below 130/90 today's reading 117/69 LDL cholesterol below 100 Exercises by walking 4-5 times weekly for overall health and well-being Follow-up in 6 months

## 2015-01-24 NOTE — Progress Notes (Signed)
I agree with the above plan 

## 2015-01-27 ENCOUNTER — Ambulatory Visit: Payer: Self-pay | Admitting: Adult Health

## 2015-02-23 ENCOUNTER — Other Ambulatory Visit: Payer: Self-pay | Admitting: Neurology

## 2015-03-02 ENCOUNTER — Other Ambulatory Visit: Payer: Self-pay | Admitting: Neurology

## 2015-05-14 IMAGING — CT CT HEAD W/O CM
2 series · 16 of 30 positions shown, 18 images · non-contrast
Comparison: none

[Series 2: head w/o · axial · non-contrast · 0.49mm/px · z∈[-50,+70]mm · 8 of 32 slices shown, 10 images]
[im 4/32  brain]
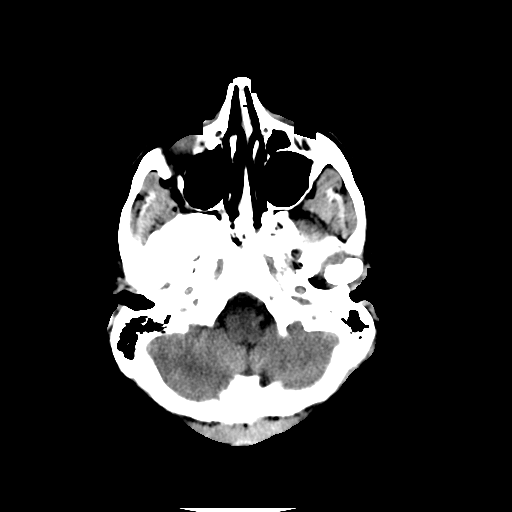
[im 4/32  bone]
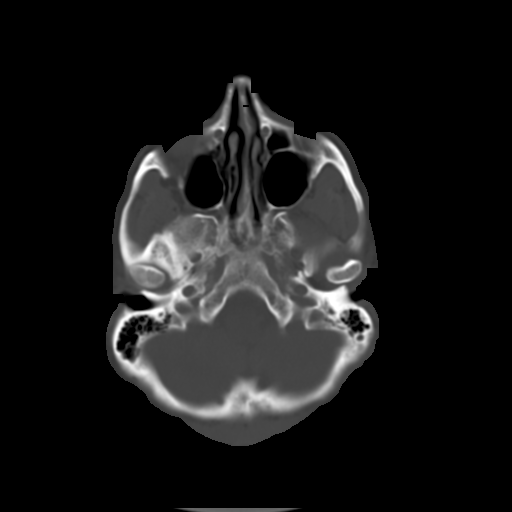
[im 7/32  brain]
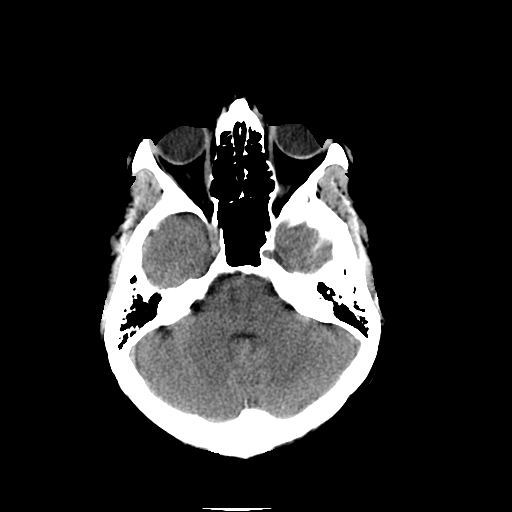
[im 11/32  brain]
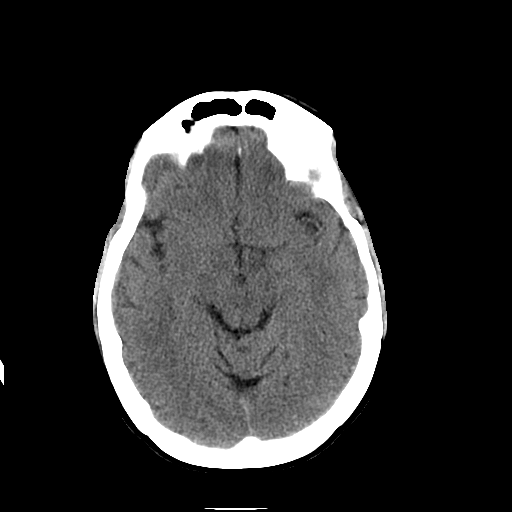
[im 14/32  brain]
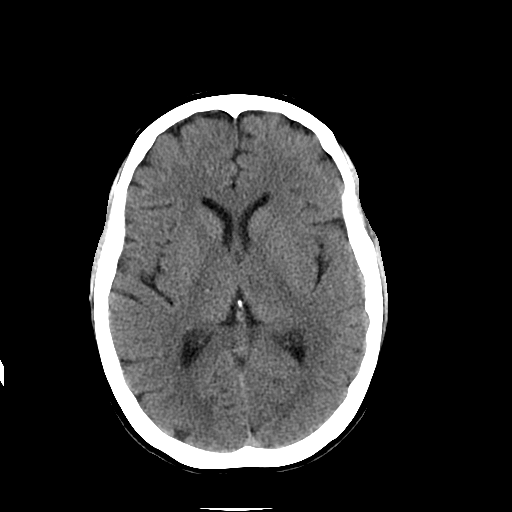
[im 18/32  brain]
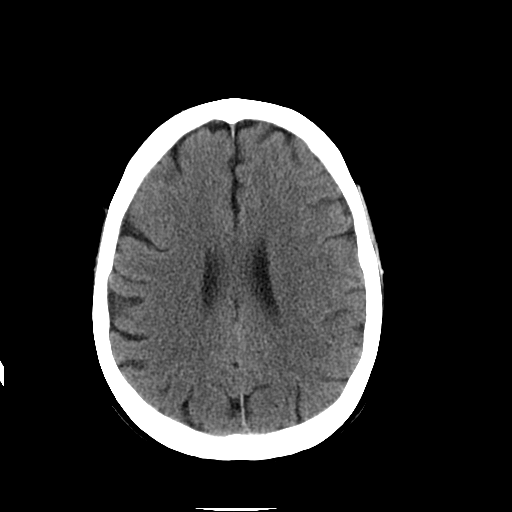
[im 18/32  bone]
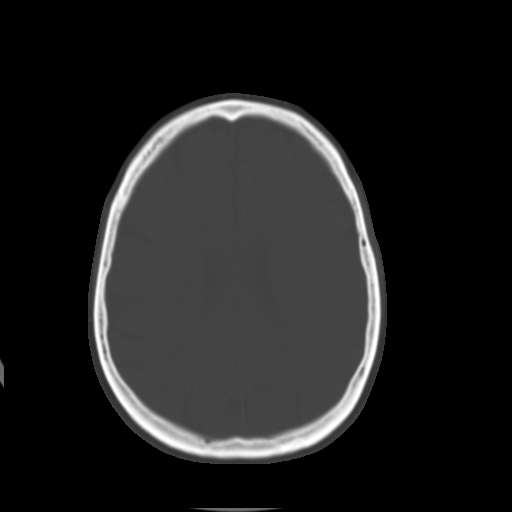
[im 21/32  brain]
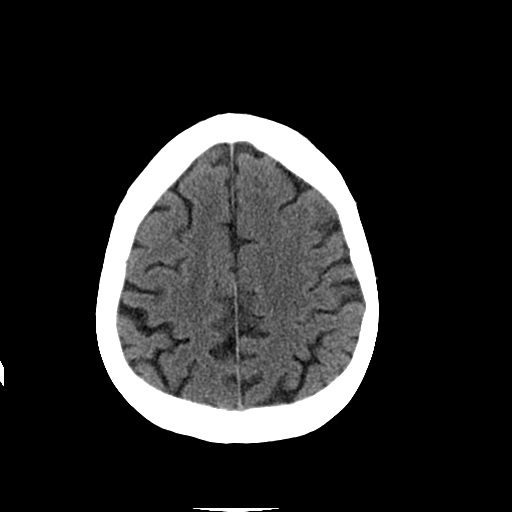
[im 25/32  brain]
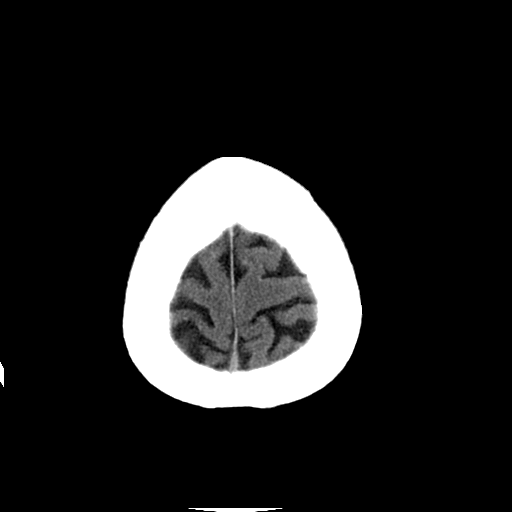
[im 28/32  brain]
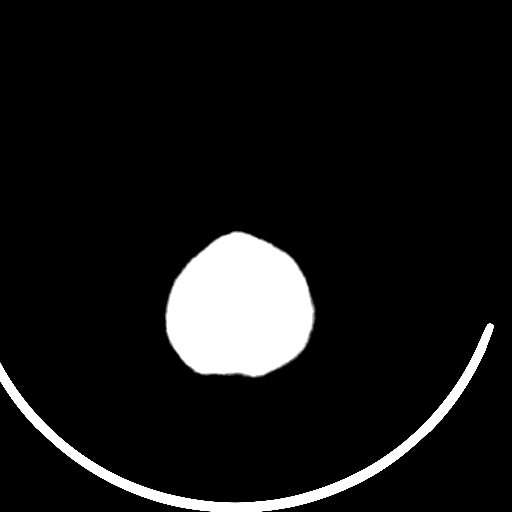

[Series 3: head w/o bone · axial · non-contrast · 0.49mm/px · z∈[-50,+73]mm · 8 of 63 slices shown]
[im 7/63  bone]
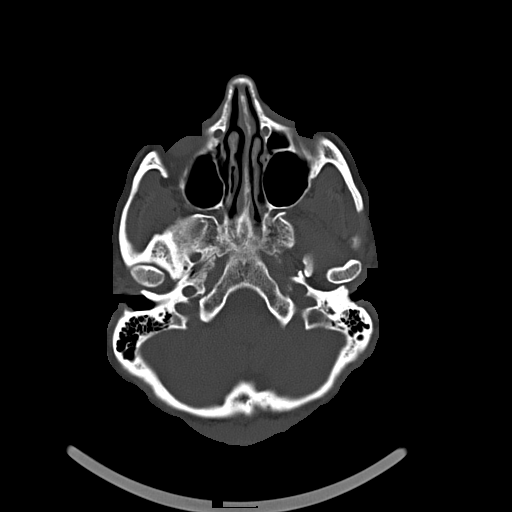
[im 14/63  bone]
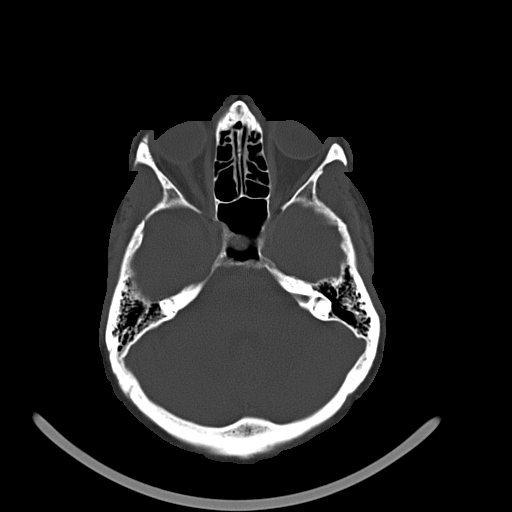
[im 20/63  bone]
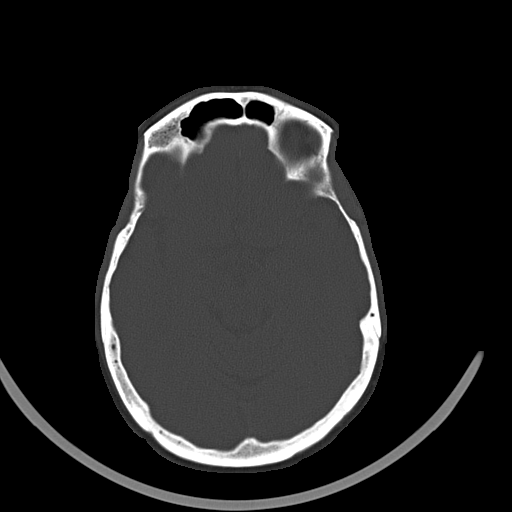
[im 27/63  bone]
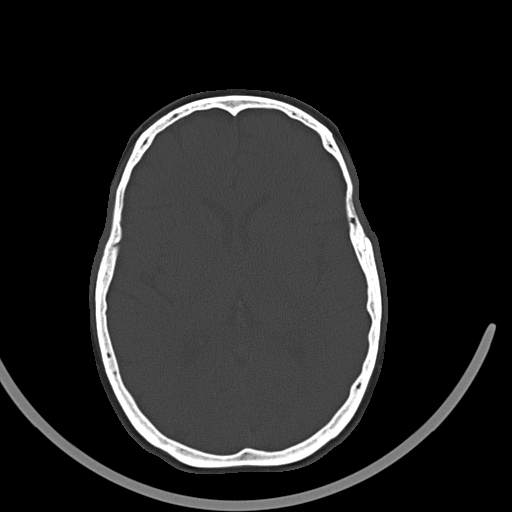
[im 36/63  bone]
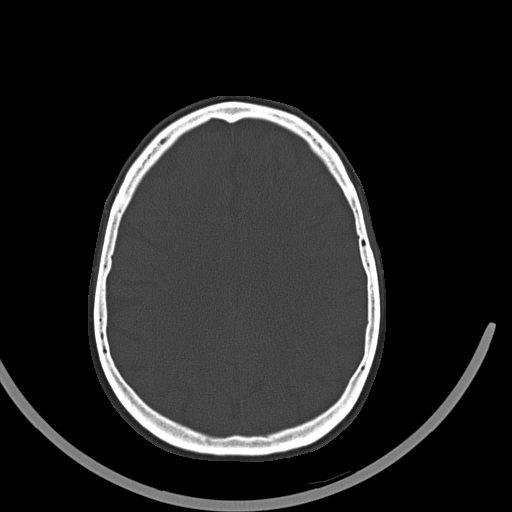
[im 43/63  bone]
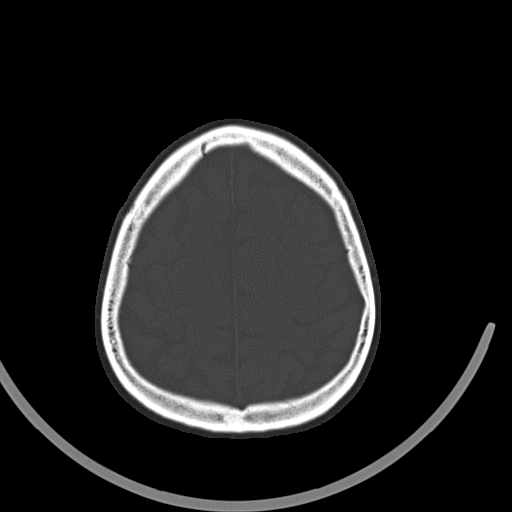
[im 49/63  bone]
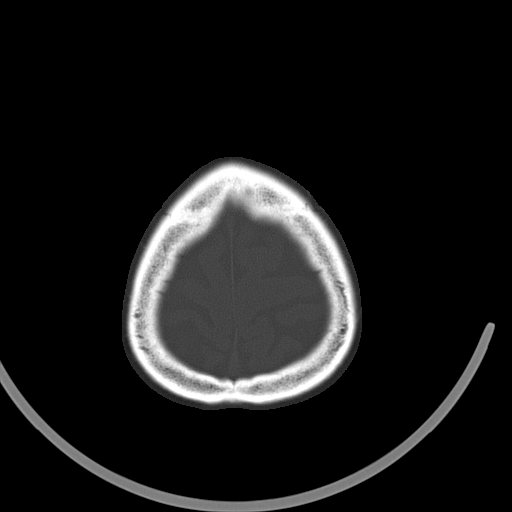
[im 56/63  bone]
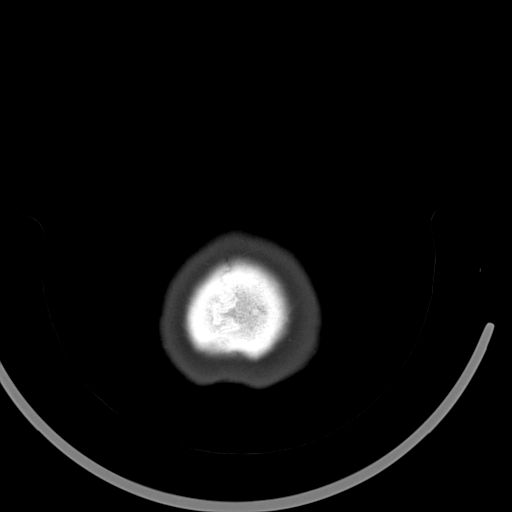

[16 of 30 positions shown; findings below may reference images not displayed]

CLINICAL DATA
Diplopia

EXAM
CT HEAD WITHOUT CONTRAST

TECHNIQUE
Contiguous axial images were obtained from the base of the skull
through the vertex without intravenous contrast. Study was obtained
within 24 hr of patient's arrival at the emergency department.

COMPARISON
None.

FINDINGS
The ventricles are normal in size and configuration. There is no
mass, hemorrhage, extra-axial fluid collection, or midline shift.
There is a focal area of decreased attenuation in the superior
posterior right temporal lobe, seen on axial slice 15. An early
infarct in this area must be question. Elsewhere, gray-white
compartments appear normal. Bony calvarium appears intact. The
mastoid air cells are clear. 1 no intraorbital lesions are
appreciated on this study. 33

IMPRESSION
Focal area of decreased attenuation in the posterior superior right
temporal lobe. An early infarct in this area is questioned.
Elsewhere gray-white compartments appear normal. There is no
demonstrable hemorrhage or mass effect.

SIGNATURE

## 2015-06-30 ENCOUNTER — Encounter: Payer: Self-pay | Admitting: Family Medicine

## 2015-06-30 ENCOUNTER — Ambulatory Visit (INDEPENDENT_AMBULATORY_CARE_PROVIDER_SITE_OTHER): Payer: 59 | Admitting: Family Medicine

## 2015-06-30 VITALS — BP 120/78 | HR 68 | Temp 98.0°F | Wt 164.6 lb

## 2015-06-30 DIAGNOSIS — J069 Acute upper respiratory infection, unspecified: Secondary | ICD-10-CM | POA: Diagnosis not present

## 2015-06-30 NOTE — Patient Instructions (Signed)
Remember to take CPAP for readings and possible update. Let me know if your symptoms are not improving in the next 3 or 4 days or if you get worse let me know sooner.   Upper Respiratory Infection, Adult An upper respiratory infection (URI) is also sometimes known as the common cold. The upper respiratory tract includes the nose, sinuses, throat, trachea, and bronchi. Bronchi are the airways leading to the lungs. Most people improve within 1 week, but symptoms can last up to 2 weeks. A residual cough may last even longer.  CAUSES Many different viruses can infect the tissues lining the upper respiratory tract. The tissues become irritated and inflamed and often become very moist. Mucus production is also common. A cold is contagious. You can easily spread the virus to others by oral contact. This includes kissing, sharing a glass, coughing, or sneezing. Touching your mouth or nose and then touching a surface, which is then touched by another person, can also spread the virus. SYMPTOMS  Symptoms typically develop 1 to 3 days after you come in contact with a cold virus. Symptoms vary from person to person. They may include:  Runny nose.  Sneezing.  Nasal congestion.  Sinus irritation.  Sore throat.  Loss of voice (laryngitis).  Cough.  Fatigue.  Muscle aches.  Loss of appetite.  Headache.  Low-grade fever. DIAGNOSIS  You might diagnose your own cold based on familiar symptoms, since most people get a cold 2 to 3 times a year. Your caregiver can confirm this based on your exam. Most importantly, your caregiver can check that your symptoms are not due to another disease such as strep throat, sinusitis, pneumonia, asthma, or epiglottitis. Blood tests, throat tests, and X-rays are not necessary to diagnose a common cold, but they may sometimes be helpful in excluding other more serious diseases. Your caregiver will decide if any further tests are required. RISKS AND COMPLICATIONS  You  may be at risk for a more severe case of the common cold if you smoke cigarettes, have chronic heart disease (such as heart failure) or lung disease (such as asthma), or if you have a weakened immune system. The very young and very old are also at risk for more serious infections. Bacterial sinusitis, middle ear infections, and bacterial pneumonia can complicate the common cold. The common cold can worsen asthma and chronic obstructive pulmonary disease (COPD). Sometimes, these complications can require emergency medical care and may be life-threatening. PREVENTION  The best way to protect against getting a cold is to practice good hygiene. Avoid oral or hand contact with people with cold symptoms. Wash your hands often if contact occurs. There is no clear evidence that vitamin C, vitamin E, echinacea, or exercise reduces the chance of developing a cold. However, it is always recommended to get plenty of rest and practice good nutrition. TREATMENT  Treatment is directed at relieving symptoms. There is no cure. Antibiotics are not effective, because the infection is caused by a virus, not by bacteria. Treatment may include:  Increased fluid intake. Sports drinks offer valuable electrolytes, sugars, and fluids.  Breathing heated mist or steam (vaporizer or shower).  Eating chicken soup or other clear broths, and maintaining good nutrition.  Getting plenty of rest.  Using gargles or lozenges for comfort.  Controlling fevers with ibuprofen or acetaminophen as directed by your caregiver.  Increasing usage of your inhaler if you have asthma. Zinc gel and zinc lozenges, taken in the first 24 hours of the common cold,  can shorten the duration and lessen the severity of symptoms. Pain medicines may help with fever, muscle aches, and throat pain. A variety of non-prescription medicines are available to treat congestion and runny nose. Your caregiver can make recommendations and may suggest nasal or lung  inhalers for other symptoms.  HOME CARE INSTRUCTIONS   Only take over-the-counter or prescription medicines for pain, discomfort, or fever as directed by your caregiver.  Use a warm mist humidifier or inhale steam from a shower to increase air moisture. This may keep secretions moist and make it easier to breathe.  Drink enough water and fluids to keep your urine clear or pale yellow.  Rest as needed.  Return to work when your temperature has returned to normal or as your caregiver advises. You may need to stay home longer to avoid infecting others. You can also use a face mask and careful hand washing to prevent spread of the virus. SEEK MEDICAL CARE IF:   After the first few days, you feel you are getting worse rather than better.  You need your caregiver's advice about medicines to control symptoms.  You develop chills, worsening shortness of breath, or brown or red sputum. These may be signs of pneumonia.  You develop yellow or brown nasal discharge or pain in the face, especially when you bend forward. These may be signs of sinusitis.  You develop a fever, swollen neck glands, pain with swallowing, or white areas in the back of your throat. These may be signs of strep throat. SEEK IMMEDIATE MEDICAL CARE IF:   You have a fever.  You develop severe or persistent headache, ear pain, sinus pain, or chest pain.  You develop wheezing, a prolonged cough, cough up blood, or have a change in your usual mucus (if you have chronic lung disease).  You develop sore muscles or a stiff neck. Document Released: 03/23/2001 Document Revised: 12/20/2011 Document Reviewed: 01/02/2014 [facility] Patient Information 2015 [place], [place]. This information is not intended to replace advice given to you by your health care provider. Make sure you discuss any questions you have with your health care provider.

## 2015-06-30 NOTE — Progress Notes (Signed)
   Subjective:    Patient ID: Carol Harris, female    DOB: 09/19/1956, 59 y.o.   MRN: [id number]  HPI She is here for a 5 day history of upper respiratory symptoms that started as sore throat and clear nasal drainage. She states she had a low-grade temperature of 100 this morning with a productive cough. She reports taking sudafed but states this has helped her drainage. She denies chills, sinus pressure, ear pain, chest pain, shortness of breath. She does not smoke. States she uses CPAP and nasal congestion/drainage is a problem with this. States she has not had her CPAP settings adjusted in a very long time.    Reviewed medications, allergies, past medical history.    Review of Systems Pertinent positives and negatives in the history of present illness.    Objective:   Physical Exam  Constitutional: She is oriented to person, place, and time. She appears well-developed and well-nourished. No distress.  HENT:  Right Ear: Tympanic membrane, external ear and ear canal normal.  Left Ear: Tympanic membrane, external ear and ear canal normal.  Nose: Mucosal edema present. Right sinus exhibits no maxillary sinus tenderness and no frontal sinus tenderness. Left sinus exhibits no maxillary sinus tenderness and no frontal sinus tenderness.  Mouth/Throat: Mucous membranes are normal. Posterior oropharyngeal erythema present. No oropharyngeal exudate.  Neck: Normal range of motion. Neck supple.  Cardiovascular: Normal rate, regular rhythm and normal heart sounds.   Pulmonary/Chest: Effort normal and breath sounds normal. She has no wheezes. She has no rhonchi.  Lymphadenopathy:    She has no cervical adenopathy.       Right: No supraclavicular adenopathy present.       Left: No supraclavicular adenopathy present.  Neurological: She is alert and oriented to person, place, and time. Gait normal.          Assessment & Plan:  Acute upper respiratory infection Discussed that the etiology  of her illness appears to be viral. If she develops a high fever or worsening symptoms or does not improve in next 2-3 days, she will let me know and I will call in antibiotic. Discussed treating her symptoms and using tylenol for aches, fever, malaise instead of NSAIDs. Discussed that typically Sudafed is used for congestion.  Provided her with sample nasal saline spray to use twice daily. She will continue to use CPAP and needs to take this in to get her readings and make sure settings are current.

## 2015-07-29 ENCOUNTER — Encounter: Payer: Self-pay | Admitting: Nurse Practitioner

## 2015-07-29 ENCOUNTER — Ambulatory Visit (INDEPENDENT_AMBULATORY_CARE_PROVIDER_SITE_OTHER): Payer: 59 | Admitting: Nurse Practitioner

## 2015-07-29 VITALS — BP 117/74 | HR 60 | Ht 65.0 in | Wt 162.4 lb

## 2015-07-29 DIAGNOSIS — G4733 Obstructive sleep apnea (adult) (pediatric): Secondary | ICD-10-CM | POA: Diagnosis not present

## 2015-07-29 DIAGNOSIS — I6381 Other cerebral infarction due to occlusion or stenosis of small artery: Secondary | ICD-10-CM

## 2015-07-29 DIAGNOSIS — I10 Essential (primary) hypertension: Secondary | ICD-10-CM

## 2015-07-29 DIAGNOSIS — I6330 Cerebral infarction due to thrombosis of unspecified cerebral artery: Secondary | ICD-10-CM | POA: Diagnosis not present

## 2015-07-29 DIAGNOSIS — I639 Cerebral infarction, unspecified: Secondary | ICD-10-CM

## 2015-07-29 DIAGNOSIS — E782 Mixed hyperlipidemia: Secondary | ICD-10-CM | POA: Diagnosis not present

## 2015-07-29 MED ORDER — CLOPIDOGREL BISULFATE 75 MG PO TABS: 75.0000 mg | ORAL_TABLET | Freq: Every day | ORAL | Status: DC

## 2015-07-29 NOTE — Patient Instructions (Addendum)
Continue Bipap at current settings every night Keep systolic blood pressure less than 130, today's reading 117/74 Lipids are followed by cardiology please send recent results to [phone] Carotid Doppler to be followed by cardiology Continue Plavix for secondary stroke prevention No further stroke or TIA symptoms since hospital admission 12/2013 If recurrent stroke symptoms occur, call 911 and proceed to the hospital Discharge from neurologic services at this time

## 2015-07-29 NOTE — Progress Notes (Signed)
[facility]  PATIENT: Carol Harris DOB: Jul 02, 1956   REASON FOR VISIT: Cerebral infarction due to thrombus of cerebral artery, thalmic infarct, hyperlipidemia and hypertension HISTORY FROM: Patient    HISTORY OF PRESENT ILLNESS: Carol Harris 59 year old female returns for follow-up. She has a history of stroke which occurred in 12/18/2013 when she woke up with vertical diplopia. Family noted slurring of speech and word finding difficulty. MRI of the brain showed an acute left thalmic infarct She is currently on Plavix and has not had further stroke or TIA symptoms since that time. She denies any side effects and no  signs bleeding or bruising is noted. Blood pressure is well controlled and is 117/74 in the office. Lipids are managed by her cardiologist. She reports that recent results were normal,  I do not have access to that information. She remains on Lipitor. She has a history of obstructive sleep apnea and uses BiPAP every night. She returns for reevaluation  HISTORY:Carol Harris is an 59 y.o. Caucasian female who comes to the office for follow up. She has a history of stroke and was last seen in the office 07/12/2014. Patient awoke on 12/18/13 and noted she had vertical diplopia and was drifting to the right. Family noted she had slurred words and some word finding difficulty. Over the course of one hour her symptoms of slurred speech seemed to clear and her vertical diplopia cleared but when she was reading she felt like the words were "bobbing". Her daughter is a Marine scientist and noted she had a slight right facial droop and some abnormal eye movements--this prompted them to come to the ED. MRI showed an acute left thalamic infarct. No intracranial flow reducing lesion or dissection was observed. 2D Echocardiogram showed an EF 60 - 65%. No cardiac source of emboli identified. Carotid Doppler showed bilateral 1-39% ICA stenosis. PMH of hyperlipidemia, cholesterol 243; LDL  151, started on pravastatin. Hgb a1c was 5.7, not diabetic. She completed a few sessions of outpatient PT and feels she has no residual deficits. She completed the Socrates trial and has been switched to Plavix. She is tolerating Plavix well with no signs of significant bleeding or bruising. She has been switched from pravastatin to Lipitor with better reduction of cholesterol according to the patient. She has no other symptoms of stroke or TIA. She has multiple questions about" having another stroke".    REVIEW OF SYSTEMS: Full 14 system review of systems performed and notable only for those listed, all others are neg:  Constitutional: neg  Cardiovascular: neg Ear/Nose/Throat: neg  Skin: neg Eyes: neg Respiratory: neg Gastroitestinal: neg  Hematology/Lymphatic: neg  Endocrine: neg Musculoskeletal:neg Allergy/Immunology: neg Neurological: neg Psychiatric: neg Sleep : Obstructive sleep apnea with BiPAP   ALLERGIES: No Known Allergies  HOME MEDICATIONS: Outpatient Prescriptions Prior to Visit  Medication Sig Dispense Refill  . atenolol (TENORMIN) 25 MG tablet Take 25 mg by mouth 2 (two) times daily.     Marland Kitchen atorvastatin (LIPITOR) 40 MG tablet Take 40 mg by mouth at bedtime.  0  . buPROPion (WELLBUTRIN SR) 150 MG 12 hr tablet Take 150 mg by mouth 2 (two) times daily.    . cholecalciferol (VITAMIN D) 1000 UNITS tablet Take 5,000 Units by mouth daily.      . clopidogrel (PLAVIX) 75 MG tablet Take 1 tablet (75 mg total) by mouth daily. 90 tablet 1  . clopidogrel (PLAVIX) 75 MG tablet TAKE 1 TABLET BY MOUTH ONCE DAILY 90 tablet 1  .  losartan (COZAAR) 50 MG tablet      No facility-administered medications prior to visit.    PAST MEDICAL HISTORY: Past Medical History  Diagnosis Date  . GERD (gastroesophageal reflux disease)   . Anxiety   . Colon polyp   . Leukopenia 09/05/2011  . Hyperlipidemia, mixed 09/05/2011  . HTN (hypertension), benign 09/05/2011    PAST SURGICAL  HISTORY: History reviewed. No pertinent past surgical history.  FAMILY HISTORY: Family History  Problem Relation Age of Onset  . Hypertension Father   . Hyperlipidemia Father   . Hypertension Mother   . Bipolar disorder Daughter     schizo affective    SOCIAL HISTORY: Social History   Social History  . Marital Status: Married    Spouse Name: Darnell Level  . Number of Children: 3  . Years of Education: BS   Occupational History  .      [employer]    Social History Main Topics  . Smoking status: Never Smoker   . Smokeless tobacco: Never Used  . Alcohol Use: 0.5 oz/week    1 drink(s) per week     Comment: occas.  . Drug Use: No  . Sexual Activity: Not on file   Other Topics Concern  . Not on file   Social History Narrative   Patient is right handed and consumes 1-2 caffienated beverages daily     PHYSICAL EXAM  Filed Vitals:   07/29/15 0753  BP: 117/74  Pulse: 60  Weight: 162 lb 6.4 oz (73.664 kg)   Body mass index is 27.02 kg/(m^2). Generalized: Well developed, in no acute distress  Head: normocephalic and atraumatic,. Oropharynx benign  Neck: Supple, no carotid bruits  Cardiac: Regular rate rhythm, no murmur  Musculoskeletal: No deformity   Neurological examination   Mentation: Alert oriented to time, place, history taking. Attention span and concentration appropriate. Recent and remote memory intact. Follows all commands speech and language fluent.   Cranial nerve II-XII: Pupils were equal round reactive to light extraocular movements were full, visual field were full on confrontational test. Facial sensation and strength were normal. hearing was intact to finger rubbing bilaterally. Uvula tongue midline. head turning and shoulder shrug were normal and symmetric.Tongue protrusion into cheek strength was normal. Motor: normal bulk and tone, full strength in the BUE, BLE, fine finger movements normal, no pronator drift. No focal weakness Sensory: normal and  symmetric to light touch, pinprick, and Vibration, proprioception  Coordination: finger-nose-finger, heel-to-shin bilaterally, no dysmetria Reflexes: Brachioradialis 2/2, biceps 2/2, triceps 2/2, patellar 2/2, Achilles 2/2, plantar responses were flexor bilaterally. Gait and Station: Rising up from seated position without assistance, normal stance, moderate stride, good arm swing, smooth turning, able to perform tiptoe, and heel walking without difficulty. Tandem gait is steady    DIAGNOSTIC DATA (LABS, IMAGING, TESTING)  ASSESSMENT AND PLAN  59 y.o. year old female  has a past medical history of Anxiety; Hyperlipidemia, mixed (09/05/2011); and HTN (hypertension), benign and left thalmic infarct without hemorrhage secondary to small vessel disease. Risk factors of hypertension, hyperlipidemia and OSA.The patient is a current patient of Dr. Leonie Man  who is out of the office today . This note is sent to the work in doctor.     Risk factor control is essential to prevent further stroke. Continue Bipap at current settings every night Keep systolic blood pressure less than 130, today's reading 117/74 Lipids are followed by cardiology please send recent results to [phone] Carotid Doppler to be followed by cardiology Continue Plavix  for secondary stroke prevention will refill for 1 year No further stroke or TIA symptoms since hospital admission 12/2013 If recurrent stroke symptoms occur, call 911 and proceed to the hospital Discharge from neurologic services at this time Vst time [place], [facility], [facility], [facility] [address], [place], [place] [address] [phone]

## 2015-07-30 NOTE — Progress Notes (Signed)
I reviewed note and agree with plan.   VIKRAM R. PENUMALLI, MD 07/30/2015, 8:38 PM Certified in Neurology, Neurophysiology and Neuroimaging  [facility] [address], [address] [place], [place] [address] [phone]  

## 2015-08-04 ENCOUNTER — Telehealth: Payer: Self-pay

## 2015-08-04 NOTE — Telephone Encounter (Signed)
Medical records received from [facility] on 08/04/15

## 2015-08-07 ENCOUNTER — Encounter: Payer: Self-pay | Admitting: Family Medicine

## 2016-08-30 ENCOUNTER — Other Ambulatory Visit: Payer: Self-pay | Admitting: Nurse Practitioner

## 2016-09-08 ENCOUNTER — Other Ambulatory Visit: Payer: Self-pay | Admitting: Nurse Practitioner

## 2016-09-27 ENCOUNTER — Other Ambulatory Visit: Payer: Self-pay | Admitting: Neurology

## 2017-12-20 DIAGNOSIS — R5383 Other fatigue: Secondary | ICD-10-CM | POA: Diagnosis not present

## 2017-12-20 DIAGNOSIS — R739 Hyperglycemia, unspecified: Secondary | ICD-10-CM | POA: Diagnosis not present

## 2017-12-20 DIAGNOSIS — E7800 Pure hypercholesterolemia, unspecified: Secondary | ICD-10-CM | POA: Diagnosis not present

## 2017-12-20 DIAGNOSIS — Z79899 Other long term (current) drug therapy: Secondary | ICD-10-CM | POA: Diagnosis not present

## 2017-12-20 DIAGNOSIS — I10 Essential (primary) hypertension: Secondary | ICD-10-CM | POA: Diagnosis not present

## 2018-01-02 DIAGNOSIS — I10 Essential (primary) hypertension: Secondary | ICD-10-CM | POA: Diagnosis not present

## 2018-01-02 DIAGNOSIS — E7800 Pure hypercholesterolemia, unspecified: Secondary | ICD-10-CM | POA: Diagnosis not present

## 2018-01-02 DIAGNOSIS — R002 Palpitations: Secondary | ICD-10-CM | POA: Diagnosis not present

## 2018-01-16 DIAGNOSIS — Z1231 Encounter for screening mammogram for malignant neoplasm of breast: Secondary | ICD-10-CM | POA: Diagnosis not present

## 2018-02-08 DIAGNOSIS — Z6826 Body mass index (BMI) 26.0-26.9, adult: Secondary | ICD-10-CM | POA: Diagnosis not present

## 2018-02-08 DIAGNOSIS — Z01419 Encounter for gynecological examination (general) (routine) without abnormal findings: Secondary | ICD-10-CM | POA: Diagnosis not present

## 2018-07-06 DIAGNOSIS — I10 Essential (primary) hypertension: Secondary | ICD-10-CM | POA: Diagnosis not present

## 2018-07-06 DIAGNOSIS — Z0000 Encounter for general adult medical examination without abnormal findings: Secondary | ICD-10-CM | POA: Diagnosis not present

## 2018-07-28 DIAGNOSIS — K5900 Constipation, unspecified: Secondary | ICD-10-CM | POA: Diagnosis not present

## 2018-07-28 DIAGNOSIS — K648 Other hemorrhoids: Secondary | ICD-10-CM | POA: Diagnosis not present

## 2018-08-01 ENCOUNTER — Telehealth: Payer: Self-pay | Admitting: Internal Medicine

## 2018-08-01 NOTE — Telephone Encounter (Signed)
Left message for pt to call back and schedule CPE

## 2018-08-04 ENCOUNTER — Ambulatory Visit: Payer: 59 | Admitting: Family Medicine

## 2018-11-03 ENCOUNTER — Ambulatory Visit (INDEPENDENT_AMBULATORY_CARE_PROVIDER_SITE_OTHER): Payer: 59 | Admitting: Family Medicine

## 2018-11-03 ENCOUNTER — Encounter: Payer: Self-pay | Admitting: Family Medicine

## 2018-11-03 VITALS — BP 110/78 | HR 56 | Temp 97.8°F | Ht 64.5 in | Wt 160.0 lb

## 2018-11-03 DIAGNOSIS — Z8639 Personal history of other endocrine, nutritional and metabolic disease: Secondary | ICD-10-CM | POA: Insufficient documentation

## 2018-11-03 DIAGNOSIS — I10 Essential (primary) hypertension: Secondary | ICD-10-CM

## 2018-11-03 DIAGNOSIS — E782 Mixed hyperlipidemia: Secondary | ICD-10-CM | POA: Diagnosis not present

## 2018-11-03 DIAGNOSIS — Z8673 Personal history of transient ischemic attack (TIA), and cerebral infarction without residual deficits: Secondary | ICD-10-CM

## 2018-11-03 DIAGNOSIS — Z0000 Encounter for general adult medical examination without abnormal findings: Secondary | ICD-10-CM | POA: Diagnosis not present

## 2018-11-03 DIAGNOSIS — D72819 Decreased white blood cell count, unspecified: Secondary | ICD-10-CM | POA: Diagnosis not present

## 2018-11-03 DIAGNOSIS — Z800 Family history of malignant neoplasm of digestive organs: Secondary | ICD-10-CM

## 2018-11-03 DIAGNOSIS — Z23 Encounter for immunization: Secondary | ICD-10-CM | POA: Diagnosis not present

## 2018-11-03 DIAGNOSIS — G4733 Obstructive sleep apnea (adult) (pediatric): Secondary | ICD-10-CM

## 2018-11-03 DIAGNOSIS — F32A Depression, unspecified: Secondary | ICD-10-CM | POA: Insufficient documentation

## 2018-11-03 DIAGNOSIS — F514 Sleep terrors [night terrors]: Secondary | ICD-10-CM

## 2018-11-03 DIAGNOSIS — K219 Gastro-esophageal reflux disease without esophagitis: Secondary | ICD-10-CM

## 2018-11-03 DIAGNOSIS — F329 Major depressive disorder, single episode, unspecified: Secondary | ICD-10-CM

## 2018-11-03 LAB — POCT URINALYSIS DIP (PROADVANTAGE DEVICE)
Bilirubin, UA: NEGATIVE
Glucose, UA: NEGATIVE mg/dL
Ketones, POC UA: NEGATIVE mg/dL
NITRITE UA: NEGATIVE
Protein Ur, POC: NEGATIVE mg/dL
RBC UA: NEGATIVE
Specific Gravity, Urine: 1.02
UUROB: 3.5
pH, UA: 6.5 (ref 5.0–8.0)

## 2018-11-03 MED ORDER — LOSARTAN POTASSIUM 50 MG PO TABS: 50.0000 mg | ORAL_TABLET | Freq: Every day | ORAL | 3 refills | Status: DC

## 2018-11-03 MED ORDER — ATENOLOL 25 MG PO TABS: 25.0000 mg | ORAL_TABLET | Freq: Two times a day (BID) | ORAL | 3 refills | Status: DC

## 2018-11-03 MED ORDER — ATORVASTATIN CALCIUM 40 MG PO TABS: 40.0000 mg | ORAL_TABLET | Freq: Every day | ORAL | 3 refills | Status: DC

## 2018-11-03 NOTE — Progress Notes (Signed)
Subjective:    Patient ID: Carol Harris, female    DOB: [birth year], 63 y.o.   MRN: [id number]  HPI She is here for complete examination.  She does have a history of hypertension and presently is taking atenolol and losartan.  She also has reflux but rarely needs medications for that.  She has a previous history of CVA and presently is taking a baby aspirin.  She continues on atorvastatin for her hyperlipidemia.  She also has OSA however has not had a readout in quite some time.  She is on Wellbutrin and being monitored at the [facility] counseling center.  Also over the last 6 months she apparently has had episodes of night terrors.  She states her husband has told her about this as she has no recollection of it.  She did at one time try to taper down on her Wellbutrin but noticed a recurrence of her depression and now is taking regular doses.  She also has a history of vitamin D deficiency and presently taking 5000 units 5 times per week.  She has a family history of colon cancer and has colonoscopies every 5 years.  She sees Dr. Earlean Shawl.  She gets regular gynecologic care and has had mammogram and Pap within the last year.  Review of Systems  All other systems reviewed and are negative.      Objective:   Physical Exam BP 110/78 (BP Location: Left Arm, Patient Position: Sitting)   Pulse (!) 56   Temp 97.8 F (36.6 C)   Ht 5' 4.5" (1.638 m)   Wt 160 lb (72.6 kg)   SpO2 99%   BMI 27.04 kg/m   General Appearance:    Alert, cooperative, no distress, appears stated age  Head:    Normocephalic, without obvious abnormality, atraumatic  Eyes:    PERRL, conjunctiva/corneas clear, EOM's intact, fundi    benign  Ears:    Normal TM's and external ear canals  Nose:   Nares normal, mucosa normal, no drainage or sinus   tenderness  Throat:   Lips, mucosa, and tongue normal; teeth and gums normal  Neck:   Supple, no lymphadenopathy;  thyroid:  no   enlargement/tenderness/nodules; no  carotid   bruit or JVD  Back:    Spine nontender, no curvature, ROM normal, no CVA     tenderness  Lungs:     Clear to auscultation bilaterally without wheezes, rales or     ronchi; respirations unlabored      Heart:    Regular rate and rhythm, S1 and S2 normal, no murmur, rub   or gallop  Breast Exam:    Deferred to GYN  Abdomen:     Soft, non-tender, nondistended, normoactive bowel sounds,    no masses, no hepatosplenomegaly  Genitalia:    Deferred to GYN     Extremities:   No clubbing, cyanosis or edema  Pulses:   2+ and symmetric all extremities  Skin:   Skin color, texture, turgor normal, no rashes or lesions  Lymph nodes:   Cervical, supraclavicular, and axillary nodes normal  Neurologic:   CNII-XII intact, normal strength, sensation and gait; reflexes 2+ and symmetric throughout          Psych:   Normal mood, affect, hygiene and grooming. Although she has had suicidal ideation she will not act upon this.         Assessment & Plan:  Routine medical exam - Plan: POCT Urinalysis DIP (Proadvantage Device), CBC with  Differential/Platelet, Comprehensive metabolic panel, Lipid panel  History of CVA (cerebrovascular accident)  HTN (hypertension), benign - Plan: CBC with Differential/Platelet, Comprehensive metabolic panel, atenolol (TENORMIN) 25 MG tablet, losartan (COZAAR) 50 MG tablet  Hyperlipidemia, mixed - Plan: Lipid panel, atorvastatin (LIPITOR) 40 MG tablet  Leukopenia, unspecified type - Plan: CBC with Differential/Platelet  Obstructive sleep apnea  Gastroesophageal reflux disease without esophagitis  Depression, unspecified depression type  Night terrors, adult  History of vitamin D deficiency - Plan: VITAMIN D 25 Hydroxy (Vit-D Deficiency, Fractures)  Family history of colon cancer in mother  Need for influenza vaccination - Plan: Flu Vaccine QUAD 6+ mos PF IM (Fluarix Quad PF)  Need for shingles vaccine - Plan: Varicella-zoster vaccine IM (Shingrix)  She  is to follow-up with [facility] counseling center in regard to possibly switching her medications around especially in regard to her night terrors. She is to get a CPAP read out to make sure that it is working properly. Her other medications will be renewed.   She will continue on BASA Return in 2 months for second Shingrix.

## 2018-11-03 NOTE — Patient Instructions (Signed)
Make an appointment with the [facility] counseling center Get a CPAP read out

## 2018-11-04 LAB — CBC WITH DIFFERENTIAL/PLATELET
Basophils Absolute: 0 10*3/uL (ref 0.0–0.2)
Basos: 1 %
EOS (ABSOLUTE): 0.1 10*3/uL (ref 0.0–0.4)
Eos: 1 %
Hematocrit: 41.4 % (ref 34.0–46.6)
Hemoglobin: 14.1 g/dL (ref 11.1–15.9)
Immature Grans (Abs): 0 10*3/uL (ref 0.0–0.1)
Immature Granulocytes: 0 %
Lymphocytes Absolute: 1.3 10*3/uL (ref 0.7–3.1)
Lymphs: 29 %
MCH: 31.6 pg (ref 26.6–33.0)
MCHC: 34.1 g/dL (ref 31.5–35.7)
MCV: 93 fL (ref 79–97)
MONOCYTES: 11 %
MONOS ABS: 0.5 10*3/uL (ref 0.1–0.9)
NEUTROS PCT: 58 %
Neutrophils Absolute: 2.6 10*3/uL (ref 1.4–7.0)
Platelets: 267 10*3/uL (ref 150–450)
RBC: 4.46 x10E6/uL (ref 3.77–5.28)
RDW: 12.7 % (ref 11.7–15.4)
WBC: 4.4 10*3/uL (ref 3.4–10.8)

## 2018-11-04 LAB — COMPREHENSIVE METABOLIC PANEL
ALT: 20 IU/L (ref 0–32)
AST: 17 IU/L (ref 0–40)
Albumin/Globulin Ratio: 1.8 (ref 1.2–2.2)
Albumin: 4.7 g/dL (ref 3.8–4.8)
Alkaline Phosphatase: 76 IU/L (ref 39–117)
BUN/Creatinine Ratio: 16 (ref 12–28)
BUN: 14 mg/dL (ref 8–27)
Bilirubin Total: 0.6 mg/dL (ref 0.0–1.2)
CO2: 22 mmol/L (ref 20–29)
Calcium: 9.6 mg/dL (ref 8.7–10.3)
Chloride: 98 mmol/L (ref 96–106)
Creatinine, Ser: 0.9 mg/dL (ref 0.57–1.00)
GFR calc Af Amer: 78 mL/min/{1.73_m2} (ref >59)
GFR, EST NON AFRICAN AMERICAN: 68 mL/min/{1.73_m2} (ref >59)
GLOBULIN, TOTAL: 2.6 g/dL (ref 1.5–4.5)
Glucose: 97 mg/dL (ref 65–99)
Potassium: 5.2 mmol/L (ref 3.5–5.2)
SODIUM: 135 mmol/L (ref 134–144)
Total Protein: 7.3 g/dL (ref 6.0–8.5)

## 2018-11-04 LAB — LIPID PANEL
CHOL/HDL RATIO: 2.8 ratio (ref 0.0–4.4)
Cholesterol, Total: 174 mg/dL (ref 100–199)
HDL: 63 mg/dL (ref >39)
LDL Calculated: 94 mg/dL (ref 0–99)
Triglycerides: 84 mg/dL (ref 0–149)
VLDL CHOLESTEROL CAL: 17 mg/dL (ref 5–40)

## 2018-11-04 LAB — VITAMIN D 25 HYDROXY (VIT D DEFICIENCY, FRACTURES): Vit D, 25-Hydroxy: 44.4 ng/mL (ref 30.0–100.0)

## 2018-11-07 DIAGNOSIS — G4733 Obstructive sleep apnea (adult) (pediatric): Secondary | ICD-10-CM | POA: Diagnosis not present

## 2018-11-08 ENCOUNTER — Encounter: Payer: Self-pay | Admitting: Family Medicine

## 2018-11-08 DIAGNOSIS — I517 Cardiomegaly: Secondary | ICD-10-CM | POA: Insufficient documentation

## 2018-11-08 DIAGNOSIS — G2581 Restless legs syndrome: Secondary | ICD-10-CM | POA: Insufficient documentation

## 2019-01-15 ENCOUNTER — Other Ambulatory Visit: Payer: Self-pay

## 2019-01-15 ENCOUNTER — Other Ambulatory Visit (INDEPENDENT_AMBULATORY_CARE_PROVIDER_SITE_OTHER): Payer: 59

## 2019-01-15 DIAGNOSIS — Z23 Encounter for immunization: Secondary | ICD-10-CM | POA: Diagnosis not present

## 2019-03-07 DIAGNOSIS — Z01419 Encounter for gynecological examination (general) (routine) without abnormal findings: Secondary | ICD-10-CM | POA: Diagnosis not present

## 2019-03-07 DIAGNOSIS — Z6827 Body mass index (BMI) 27.0-27.9, adult: Secondary | ICD-10-CM | POA: Diagnosis not present

## 2019-03-07 DIAGNOSIS — Z1231 Encounter for screening mammogram for malignant neoplasm of breast: Secondary | ICD-10-CM | POA: Diagnosis not present

## 2019-03-09 LAB — HM PAP SMEAR: HM Pap smear: NEGATIVE

## 2019-05-08 ENCOUNTER — Other Ambulatory Visit: Payer: Self-pay

## 2019-05-08 ENCOUNTER — Ambulatory Visit (INDEPENDENT_AMBULATORY_CARE_PROVIDER_SITE_OTHER): Payer: 59 | Admitting: Family Medicine

## 2019-05-08 ENCOUNTER — Encounter: Payer: Self-pay | Admitting: Family Medicine

## 2019-05-08 VITALS — BP 132/82 | HR 63 | Temp 98.6°F | Wt 163.8 lb

## 2019-05-08 DIAGNOSIS — M79605 Pain in left leg: Secondary | ICD-10-CM | POA: Diagnosis not present

## 2019-05-08 DIAGNOSIS — Z6379 Other stressful life events affecting family and household: Secondary | ICD-10-CM

## 2019-05-08 DIAGNOSIS — M79604 Pain in right leg: Secondary | ICD-10-CM | POA: Diagnosis not present

## 2019-05-08 NOTE — Progress Notes (Addendum)
Acute Office Visit  Subjective:    Patient ID: Carol Harris, female    DOB: 1956/07/02, 63 y.o.   MRN: [id number]  Chief Complaint  Patient presents with  . other    leg pain for the pat few month, pt sits a lot and when satnding has some pain in the pelvic area No injury    HPI Patient is in today for leg pain. 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 Around the same time, she felt pain in right hip described as constant and achy. Accompanied by dispersed, achy, leg pain bilaterally. Episodes of leg pain would happen a couple nights a week and would be relieved by propping up legs with pillows. No sharp pain or inability to move joints. Went to chiropractor and stated that he found the rt leg was weaker and more stiff. Sent home with hip and knee stretches which she has accomplished without trouble. Two nights ago, she had an episode of severe, achy leg pain on both sides that she managed with 600 mg Tylenol and  elevation of the legs.  She wonders if this is related to taking atorvastatin. She also is under a lot of stress with the fact that her husband is now dealing with prostate cancer and her daughter is now home and dealing with bipolar disorder.  Outpatient Medications Prior to Visit  Medication Sig Dispense Refill  . acetaminophen (TYLENOL) 500 MG tablet Take 500 mg by mouth every 6 (six) hours as needed.    Marland Kitchen aspirin EC 81 MG tablet Take 81 mg by mouth daily.    Marland Kitchen atenolol (TENORMIN) 25 MG tablet Take 1 tablet (25 mg total) by mouth 2 (two) times daily. 90 tablet 3  . atorvastatin (LIPITOR) 40 MG tablet Take 1 tablet (40 mg total) by mouth at bedtime. 90 tablet 3  . buPROPion (WELLBUTRIN SR) 150 MG 12 hr tablet Take 150 mg by mouth 2 (two) times daily.    . cholecalciferol (VITAMIN D) 1000 UNITS tablet Take 5,000 Units by mouth daily.      . cyanocobalamin 100 MCG tablet Take 100 mcg by mouth daily.    Marland Kitchen losartan  (COZAAR) 50 MG tablet Take 1 tablet (50 mg total) by mouth daily. 90 tablet 3  . clopidogrel (PLAVIX) 75 MG tablet TAKE 1 TABLET (75 MG TOTAL) BY MOUTH DAILY. (Patient not taking: Reported on 11/03/2018) 30 tablet 0   No facility-administered medications prior to visit.     No Known Allergies  ROS     Objective:    Physical Exam  BP 132/82 (BP Location: Left Arm, Patient Position: Sitting)   Pulse 63   Temp 98.6 F (37 C)   Wt 163 lb 12.8 oz (74.3 kg)   SpO2 99%   BMI 27.68 kg/m  Wt Readings from Last 3 Encounters:  05/08/19 163 lb 12.8 oz (74.3 kg)  11/03/18 160 lb (72.6 kg)  07/29/15 162 lb 6.4 oz (73.7 kg)  Alert and in no distress.  Full hip motion DTRs are normal.  Negative straight leg raising.  No palpable tenderness.  Normal lumbar curve and motion.     Assessment & Plan:   Encounter Diagnoses  Name Primary?  . Pain in both lower extremities Yes  . Stress due to illness of family member   I explained to her at this point there is nothing of any major concern.  Recommend supportive care  including taking the pain medication of choice. I then discussed the stress that she is under dealing with her husband and daughter and strongly encouraged her to get involved in counseling. Greater than 25 minutes and over 50% spent in counseling and coordination of care.  Patient seen in conjunction with Cline Crock, Medical Student

## 2019-07-02 DIAGNOSIS — F332 Major depressive disorder, recurrent severe without psychotic features: Secondary | ICD-10-CM | POA: Diagnosis not present

## 2019-07-02 DIAGNOSIS — F411 Generalized anxiety disorder: Secondary | ICD-10-CM | POA: Diagnosis not present

## 2019-07-02 DIAGNOSIS — F900 Attention-deficit hyperactivity disorder, predominantly inattentive type: Secondary | ICD-10-CM | POA: Diagnosis not present

## 2019-07-10 ENCOUNTER — Other Ambulatory Visit: Payer: 59

## 2019-07-11 ENCOUNTER — Other Ambulatory Visit (INDEPENDENT_AMBULATORY_CARE_PROVIDER_SITE_OTHER): Payer: PPO

## 2019-07-11 ENCOUNTER — Other Ambulatory Visit: Payer: Self-pay

## 2019-07-11 DIAGNOSIS — Z23 Encounter for immunization: Secondary | ICD-10-CM

## 2019-07-16 DIAGNOSIS — F411 Generalized anxiety disorder: Secondary | ICD-10-CM | POA: Diagnosis not present

## 2019-07-16 DIAGNOSIS — F332 Major depressive disorder, recurrent severe without psychotic features: Secondary | ICD-10-CM | POA: Diagnosis not present

## 2019-07-16 DIAGNOSIS — F900 Attention-deficit hyperactivity disorder, predominantly inattentive type: Secondary | ICD-10-CM | POA: Diagnosis not present

## 2019-08-01 DIAGNOSIS — F411 Generalized anxiety disorder: Secondary | ICD-10-CM | POA: Diagnosis not present

## 2019-08-01 DIAGNOSIS — F332 Major depressive disorder, recurrent severe without psychotic features: Secondary | ICD-10-CM | POA: Diagnosis not present

## 2019-08-01 DIAGNOSIS — F900 Attention-deficit hyperactivity disorder, predominantly inattentive type: Secondary | ICD-10-CM | POA: Diagnosis not present

## 2019-08-13 DIAGNOSIS — F411 Generalized anxiety disorder: Secondary | ICD-10-CM | POA: Diagnosis not present

## 2019-08-13 DIAGNOSIS — F332 Major depressive disorder, recurrent severe without psychotic features: Secondary | ICD-10-CM | POA: Diagnosis not present

## 2019-08-13 DIAGNOSIS — F900 Attention-deficit hyperactivity disorder, predominantly inattentive type: Secondary | ICD-10-CM | POA: Diagnosis not present

## 2019-08-27 DIAGNOSIS — F411 Generalized anxiety disorder: Secondary | ICD-10-CM | POA: Diagnosis not present

## 2019-08-27 DIAGNOSIS — F332 Major depressive disorder, recurrent severe without psychotic features: Secondary | ICD-10-CM | POA: Diagnosis not present

## 2019-08-27 DIAGNOSIS — F900 Attention-deficit hyperactivity disorder, predominantly inattentive type: Secondary | ICD-10-CM | POA: Diagnosis not present

## 2019-09-05 ENCOUNTER — Other Ambulatory Visit: Payer: Self-pay

## 2019-09-10 DIAGNOSIS — F332 Major depressive disorder, recurrent severe without psychotic features: Secondary | ICD-10-CM | POA: Diagnosis not present

## 2019-09-10 DIAGNOSIS — F900 Attention-deficit hyperactivity disorder, predominantly inattentive type: Secondary | ICD-10-CM | POA: Diagnosis not present

## 2019-09-10 DIAGNOSIS — F411 Generalized anxiety disorder: Secondary | ICD-10-CM | POA: Diagnosis not present

## 2019-09-20 DIAGNOSIS — F332 Major depressive disorder, recurrent severe without psychotic features: Secondary | ICD-10-CM | POA: Diagnosis not present

## 2019-09-20 DIAGNOSIS — F411 Generalized anxiety disorder: Secondary | ICD-10-CM | POA: Diagnosis not present

## 2019-09-20 DIAGNOSIS — F900 Attention-deficit hyperactivity disorder, predominantly inattentive type: Secondary | ICD-10-CM | POA: Diagnosis not present

## 2019-10-08 ENCOUNTER — Other Ambulatory Visit: Payer: Self-pay | Admitting: Family Medicine

## 2019-10-08 DIAGNOSIS — F411 Generalized anxiety disorder: Secondary | ICD-10-CM | POA: Diagnosis not present

## 2019-10-08 DIAGNOSIS — I10 Essential (primary) hypertension: Secondary | ICD-10-CM

## 2019-10-08 DIAGNOSIS — E782 Mixed hyperlipidemia: Secondary | ICD-10-CM

## 2019-10-08 DIAGNOSIS — F332 Major depressive disorder, recurrent severe without psychotic features: Secondary | ICD-10-CM | POA: Diagnosis not present

## 2019-10-08 DIAGNOSIS — F900 Attention-deficit hyperactivity disorder, predominantly inattentive type: Secondary | ICD-10-CM | POA: Diagnosis not present

## 2019-10-22 DIAGNOSIS — F411 Generalized anxiety disorder: Secondary | ICD-10-CM | POA: Diagnosis not present

## 2019-10-22 DIAGNOSIS — F900 Attention-deficit hyperactivity disorder, predominantly inattentive type: Secondary | ICD-10-CM | POA: Diagnosis not present

## 2019-10-22 DIAGNOSIS — F332 Major depressive disorder, recurrent severe without psychotic features: Secondary | ICD-10-CM | POA: Diagnosis not present

## 2019-11-05 DIAGNOSIS — F900 Attention-deficit hyperactivity disorder, predominantly inattentive type: Secondary | ICD-10-CM | POA: Diagnosis not present

## 2019-11-05 DIAGNOSIS — F411 Generalized anxiety disorder: Secondary | ICD-10-CM | POA: Diagnosis not present

## 2019-11-05 DIAGNOSIS — F332 Major depressive disorder, recurrent severe without psychotic features: Secondary | ICD-10-CM | POA: Diagnosis not present

## 2019-11-09 ENCOUNTER — Encounter: Payer: 59 | Admitting: Family Medicine

## 2019-11-13 ENCOUNTER — Other Ambulatory Visit: Payer: Self-pay

## 2019-11-13 ENCOUNTER — Encounter: Payer: Self-pay | Admitting: Family Medicine

## 2019-11-13 ENCOUNTER — Ambulatory Visit (INDEPENDENT_AMBULATORY_CARE_PROVIDER_SITE_OTHER): Payer: PPO | Admitting: Family Medicine

## 2019-11-13 VITALS — BP 132/88 | HR 58 | Temp 96.8°F | Ht 64.5 in | Wt 167.8 lb

## 2019-11-13 DIAGNOSIS — G2581 Restless legs syndrome: Secondary | ICD-10-CM

## 2019-11-13 DIAGNOSIS — D72819 Decreased white blood cell count, unspecified: Secondary | ICD-10-CM | POA: Diagnosis not present

## 2019-11-13 DIAGNOSIS — Z1159 Encounter for screening for other viral diseases: Secondary | ICD-10-CM

## 2019-11-13 DIAGNOSIS — I10 Essential (primary) hypertension: Secondary | ICD-10-CM | POA: Diagnosis not present

## 2019-11-13 DIAGNOSIS — I517 Cardiomegaly: Secondary | ICD-10-CM

## 2019-11-13 DIAGNOSIS — F329 Major depressive disorder, single episode, unspecified: Secondary | ICD-10-CM

## 2019-11-13 DIAGNOSIS — G4733 Obstructive sleep apnea (adult) (pediatric): Secondary | ICD-10-CM

## 2019-11-13 DIAGNOSIS — Z8673 Personal history of transient ischemic attack (TIA), and cerebral infarction without residual deficits: Secondary | ICD-10-CM | POA: Diagnosis not present

## 2019-11-13 DIAGNOSIS — K219 Gastro-esophageal reflux disease without esophagitis: Secondary | ICD-10-CM

## 2019-11-13 DIAGNOSIS — F32A Depression, unspecified: Secondary | ICD-10-CM

## 2019-11-13 DIAGNOSIS — Z800 Family history of malignant neoplasm of digestive organs: Secondary | ICD-10-CM | POA: Diagnosis not present

## 2019-11-13 DIAGNOSIS — Z8639 Personal history of other endocrine, nutritional and metabolic disease: Secondary | ICD-10-CM

## 2019-11-13 DIAGNOSIS — F514 Sleep terrors [night terrors]: Secondary | ICD-10-CM | POA: Diagnosis not present

## 2019-11-13 DIAGNOSIS — E782 Mixed hyperlipidemia: Secondary | ICD-10-CM | POA: Diagnosis not present

## 2019-11-13 DIAGNOSIS — Z0000 Encounter for general adult medical examination without abnormal findings: Secondary | ICD-10-CM

## 2019-11-13 LAB — POCT URINALYSIS DIP (PROADVANTAGE DEVICE)
Bilirubin, UA: NEGATIVE
Blood, UA: NEGATIVE
Glucose, UA: NEGATIVE mg/dL
Ketones, POC UA: NEGATIVE mg/dL
Leukocytes, UA: NEGATIVE
Nitrite, UA: NEGATIVE
Protein Ur, POC: NEGATIVE mg/dL
Specific Gravity, Urine: 1.01
Urobilinogen, Ur: 0.2
pH, UA: 8 (ref 5.0–8.0)

## 2019-11-13 MED ORDER — ATENOLOL 25 MG PO TABS: 25.0000 mg | ORAL_TABLET | Freq: Two times a day (BID) | ORAL | 3 refills | Status: DC

## 2019-11-13 MED ORDER — ATORVASTATIN CALCIUM 40 MG PO TABS: ORAL_TABLET | ORAL | 3 refills | Status: DC

## 2019-11-13 MED ORDER — LOSARTAN POTASSIUM 50 MG PO TABS: 50.0000 mg | ORAL_TABLET | Freq: Every day | ORAL | 3 refills | Status: DC

## 2019-11-13 NOTE — Progress Notes (Signed)
Carol Harris is a 64 y.o. female who presents for a welcome to Medicare visit,CPE and follow-up on chronic medical conditions.  She has OSA is well as restless leg syndrome.  She is doing quite nicely on the CPAP.  She has not had a read out in quite some time.  She is very happy with the CPAP and has no great difficulty with the restless legs.  She occasionally does have night terrors.  She has been under a lot of stress dealing with the mental illness of her daughter as well as her husband having cancer.  She is involved in counseling and is on Wellbutrin.  She seems to be handling this fairly well.  She continues on atenolol and losartan for her hypertension.  She did have a previous history of chest pain but this was mainly stress related.  She has very little difficulty with reflux.  She does have a family history of colon cancer and is scheduled for another colonoscopy later this year.  She follows up regularly with her gynecologist and has had a DEXA, Pap as well as mammogram done.  She has a remote history of TIA and presently is on aspirin.  She also has a remote history of leukopenia that was evaluated by Dr. Beryle Beams with no apparent follow-up needed.  She also has a history of vitamin D deficiency and is on regular vitamin D supplements.  Otherwise her family and social history is unremarkable.  Immunizations and Health Maintenance Immunization History  Administered Date(s) Administered  . Fluad Quad(high Dose 65+) 07/11/2019  . Influenza,inj,Quad PF,6+ Mos 11/03/2018  . Tdap 04/21/2011  . Zoster Recombinat (Shingrix) 11/03/2018, 01/15/2019   Health Maintenance Due  Topic Date Due  . Hepatitis C Screening  1956-09-07  . PAP SMEAR-Modifier  06/26/1975  . MAMMOGRAM  01/04/2016  . DEXA SCAN  06/26/2019    Last Pap smear: ?  Sees gynecologist regularly. Last mammogram: 01-03-14 we will get records from gynecologist Last colonoscopy: 12-03-14 ;scheduled for this year. Last DEXA:  longer than 5 years ago Dentist: six months ago Ophtho: Nov 20/20 Exercise: work out at home   Other doctors caring for patient include: [facility], Tourist information centre manager,    Advanced directives: not on file information asked for. Does Patient Have a Medical Advance Directive?: Yes Type of Advance Directive: Living will, Healthcare Power of Attorney Does patient want to make changes to medical advance directive?: No - Patient declined Copy of [place] in Chart?: No - copy requested  Depression screen:  See questionnaire below.  Depression screen [facility] 2/9 11/13/2019 11/13/2019 11/03/2018  Decreased Interest 0 0 2  Down, Depressed, Hopeless 1 1 2   PHQ - 2 Score 1 1 4   Altered sleeping 0 - 0  Tired, decreased energy 0 - 0  Change in appetite 0 - 0  Feeling bad or failure about yourself  1 - 2  Trouble concentrating 0 - 2  Moving slowly or fidgety/restless 0 - 0  Suicidal thoughts 1 - 2  PHQ-9 Score 3 - 10  Difficult doing work/chores - - Very difficult    Fall Risk Screen: see questionnaire below. Fall Risk  09/05/2019  Falls in the past year? 0  Comment Emmi Telephone Survey: data to providers prior to load    ADL screen:  See questionnaire below Functional Status Survey: Is the patient deaf or have difficulty hearing?: No Does the patient have difficulty seeing, even when wearing glasses/contacts?: No Does the patient  have difficulty concentrating, remembering, or making decisions?: No Does the patient have difficulty walking or climbing stairs?: No Does the patient have difficulty dressing or bathing?: No Does the patient have difficulty doing errands alone such as visiting a doctor's office or shopping?: No   Review of Systems Constitutional: -, -unexpected weight change, -anorexia, -fatigue Allergy: -sneezing, -itching, -congestion Dermatology: denies changing moles, rash, lumps ENT: -runny nose, -ear pain, -sore throat,  Cardiology:  -chest pain,  -palpitations, -orthopnea, Respiratory: -cough, -shortness of breath, -dyspnea on exertion, -wheezing,  Gastroenterology: -abdominal pain, -nausea, -vomiting, -diarrhea, -constipation, -dysphagia Hematology: -bleeding or bruising problems Musculoskeletal: -arthralgias, -myalgias, -joint swelling, -back pain, - Ophthalmology: -vision changes,  Urology: -dysuria, -difficulty urinating,  -urinary frequency, -urgency, incontinence Neurology: -, -numbness, , -memory loss, -falls, -dizziness    PHYSICAL EXAM:  BP 132/88 (BP Location: Left Arm, Patient Position: Sitting)   Pulse (!) 58   Temp (!) 96.8 F (36 C)   Ht 5' 4.5" (1.638 m)   Wt 167 lb 12.8 oz (76.1 kg)   SpO2 98%   BMI 28.36 kg/m   General Appearance: Alert, cooperative, no distress, appears stated age Head: Normocephalic, without obvious abnormality, atraumatic Eyes: PERRL, conjunctiva/corneas clear, EOM's intact, fundi benign Ears: Normal TM's and external ear canals Nose: Nares normal, mucosa normal, no drainage or sinus tenderness Throat: Lips, mucosa, and tongue normal; teeth and gums normal Neck: Supple, no lymphadenopathy;  thyroid:  no enlargement/tenderness/nodules; no carotid bruit or JVD Lungs: Clear to auscultation bilaterally without wheezes, rales or ronchi; respirations unlabored Heart: Regular rate and rhythm, S1 and S2 normal, no murmur, rubor gallop Abdomen: Soft, non-tender, nondistended, normoactive bowel sounds,  no masses, no hepatosplenomegaly Skin:  Skin color, texture, turgor normal, no rashes or lesions Lymph nodes: Cervical, supraclavicular, and axillary nodes normal Neurologic:  CNII-XII intact, normal strength, sensation and gait; reflexes 2+ and symmetric throughout Psych: Normal mood, affect, hygiene and grooming.  ASSESSMENT/PLAN: Routine general medical examination at a health care facility  Obstructive sleep apnea  Restless leg syndrome  Night terrors, adult  Left ventricular  hypertrophy  Hyperlipidemia, mixed - Plan: Lipid panel, atorvastatin (LIPITOR) 40 MG tablet  HTN (hypertension), benign - Plan: CBC with Differential/Platelet, Comprehensive metabolic panel, atenolol (TENORMIN) 25 MG tablet, losartan (COZAAR) 50 MG tablet  History of vitamin D deficiency - Plan: VITAMIN D 25 Hydroxy (Vit-D Deficiency, Fractures)  History of CVA (cerebrovascular accident)  Gastroesophageal reflux disease without esophagitis  Family history of colon cancer in mother  Need for hepatitis C screening test - Plan: Hepatitis C antibody  Leukopenia, unspecified type  Depression, unspecified depression type Recommend she get a CPAP readout.  She will also follow-up with Dr. Thana Farr concerning repeat colonoscopy.  She will continue to be followed by her therapist and on her present Wellbutrin.  This seems to be working well for her.  Continue on her present blood pressure medications.    healthy diet, including goals of calcium and vitamin D intake and alcohol recommendations (less than or equal to 1 drink/day) reviewed; regular seatbelt use; changing batteries in smoke detectors.  Immunization recommendations discussed.  Colonoscopy recommendations reviewed   Medicare Attestation I have personally reviewed: The patient's medical and social history Their use of alcohol, tobacco or illicit drugs Their current medications and supplements The patient's functional ability including ADLs,fall risks, home safety risks, cognitive, and hearing and visual impairment Diet and physical activities Evidence for depression or mood disorders  The patient's weight, height, and BMI have been recorded in  the chart.  I have made referrals, counseling, and provided education to the patient based on review of the above and I have provided the patient with a written personalized care plan for preventive services.     Jill Alexanders, MD   11/13/2019

## 2019-11-13 NOTE — Addendum Note (Signed)
Addended by: Elyse Jarvis on: 11/13/2019 01:36 PM   Modules accepted: Orders

## 2019-11-14 LAB — COMPREHENSIVE METABOLIC PANEL
ALT: 21 IU/L (ref 0–32)
AST: 17 IU/L (ref 0–40)
Albumin/Globulin Ratio: 1.8 (ref 1.2–2.2)
Albumin: 4.6 g/dL (ref 3.8–4.8)
Alkaline Phosphatase: 79 IU/L (ref 39–117)
BUN/Creatinine Ratio: 11 — ABNORMAL LOW (ref 12–28)
BUN: 9 mg/dL (ref 8–27)
Bilirubin Total: 0.6 mg/dL (ref 0.0–1.2)
CO2: 21 mmol/L (ref 20–29)
Calcium: 9.7 mg/dL (ref 8.7–10.3)
Chloride: 98 mmol/L (ref 96–106)
Creatinine, Ser: 0.85 mg/dL (ref 0.57–1.00)
GFR calc Af Amer: 83 mL/min/{1.73_m2} (ref >59)
GFR calc non Af Amer: 72 mL/min/{1.73_m2} (ref >59)
Globulin, Total: 2.6 g/dL (ref 1.5–4.5)
Glucose: 106 mg/dL — ABNORMAL HIGH (ref 65–99)
Potassium: 4.7 mmol/L (ref 3.5–5.2)
Sodium: 133 mmol/L — ABNORMAL LOW (ref 134–144)
Total Protein: 7.2 g/dL (ref 6.0–8.5)

## 2019-11-14 LAB — HEPATITIS C ANTIBODY: Hep C Virus Ab: <0.1 s/co ratio (ref 0.0–0.9)

## 2019-11-14 LAB — CBC WITH DIFFERENTIAL/PLATELET
Basophils Absolute: 0 10*3/uL (ref 0.0–0.2)
Basos: 1 %
EOS (ABSOLUTE): 0.1 10*3/uL (ref 0.0–0.4)
Eos: 2 %
Hematocrit: 41.9 % (ref 34.0–46.6)
Hemoglobin: 14.1 g/dL (ref 11.1–15.9)
Immature Grans (Abs): 0 10*3/uL (ref 0.0–0.1)
Immature Granulocytes: 0 %
Lymphocytes Absolute: 1.2 10*3/uL (ref 0.7–3.1)
Lymphs: 30 %
MCH: 31.2 pg (ref 26.6–33.0)
MCHC: 33.7 g/dL (ref 31.5–35.7)
MCV: 93 fL (ref 79–97)
Monocytes Absolute: 0.4 10*3/uL (ref 0.1–0.9)
Monocytes: 11 %
Neutrophils Absolute: 2.4 10*3/uL (ref 1.4–7.0)
Neutrophils: 56 %
Platelets: 255 10*3/uL (ref 150–450)
RBC: 4.52 x10E6/uL (ref 3.77–5.28)
RDW: 12.6 % (ref 11.7–15.4)
WBC: 4.2 10*3/uL (ref 3.4–10.8)

## 2019-11-14 LAB — LIPID PANEL
Chol/HDL Ratio: 2.8 ratio (ref 0.0–4.4)
Cholesterol, Total: 193 mg/dL (ref 100–199)
HDL: 70 mg/dL (ref >39)
LDL Chol Calc (NIH): 105 mg/dL — ABNORMAL HIGH (ref 0–99)
Triglycerides: 100 mg/dL (ref 0–149)
VLDL Cholesterol Cal: 18 mg/dL (ref 5–40)

## 2019-11-14 LAB — VITAMIN D 25 HYDROXY (VIT D DEFICIENCY, FRACTURES): Vit D, 25-Hydroxy: 33.9 ng/mL (ref 30.0–100.0)

## 2019-11-17 ENCOUNTER — Ambulatory Visit: Payer: Self-pay | Attending: Internal Medicine

## 2019-11-17 DIAGNOSIS — Z23 Encounter for immunization: Secondary | ICD-10-CM | POA: Insufficient documentation

## 2019-11-17 NOTE — Progress Notes (Signed)
   Covid-19 Vaccination Clinic  Name:  Carol Harris    MRN: [id number] DOB: 1956/09/13  11/17/2019  [name] was observed post Covid-19 immunization for 15 minutes without incidence. She was provided with Vaccine Information Sheet and instruction to access the V-Safe system.   [name] was instructed to call 911 with any severe reactions post vaccine: Marland Kitchen Difficulty breathing  . Swelling of your face and throat  . A fast heartbeat  . A bad rash all over your body  . Dizziness and weakness    Immunizations Administered    Name Date Dose VIS Date Route   Pfizer COVID-19 Vaccine 11/17/2019  3:12 PM 0.3 mL 09/21/2019 Intramuscular   Manufacturer: [place]   Lot: [id number]   [place]: [id number]

## 2019-11-19 DIAGNOSIS — F411 Generalized anxiety disorder: Secondary | ICD-10-CM | POA: Diagnosis not present

## 2019-11-19 DIAGNOSIS — F900 Attention-deficit hyperactivity disorder, predominantly inattentive type: Secondary | ICD-10-CM | POA: Diagnosis not present

## 2019-11-19 DIAGNOSIS — F332 Major depressive disorder, recurrent severe without psychotic features: Secondary | ICD-10-CM | POA: Diagnosis not present

## 2019-11-27 ENCOUNTER — Telehealth: Payer: Self-pay | Admitting: Family Medicine

## 2019-11-27 NOTE — Telephone Encounter (Signed)
Received a fax from [facility]. Not a pt at facility.

## 2019-12-03 DIAGNOSIS — F411 Generalized anxiety disorder: Secondary | ICD-10-CM | POA: Diagnosis not present

## 2019-12-03 DIAGNOSIS — F332 Major depressive disorder, recurrent severe without psychotic features: Secondary | ICD-10-CM | POA: Diagnosis not present

## 2019-12-03 DIAGNOSIS — F900 Attention-deficit hyperactivity disorder, predominantly inattentive type: Secondary | ICD-10-CM | POA: Diagnosis not present

## 2019-12-04 ENCOUNTER — Encounter: Payer: Self-pay | Admitting: Family Medicine

## 2019-12-04 ENCOUNTER — Ambulatory Visit (INDEPENDENT_AMBULATORY_CARE_PROVIDER_SITE_OTHER): Payer: PPO | Admitting: Family Medicine

## 2019-12-04 ENCOUNTER — Ambulatory Visit: Payer: Self-pay

## 2019-12-04 ENCOUNTER — Other Ambulatory Visit: Payer: Self-pay

## 2019-12-04 VITALS — BP 102/74 | HR 57 | Temp 98.0°F | Wt 168.6 lb

## 2019-12-04 DIAGNOSIS — R7309 Other abnormal glucose: Secondary | ICD-10-CM

## 2019-12-04 LAB — POCT GLYCOSYLATED HEMOGLOBIN (HGB A1C): Hemoglobin A1C: 5.6 % (ref 4.0–5.6)

## 2019-12-04 NOTE — Progress Notes (Signed)
   Subjective:    Patient ID: Carol Harris, female    DOB: Oct 14, 1955, 64 y.o.   MRN: [id number]  HPI She was seen recently for an examination and found to have a slightly elevated blood sugar.  She is here today to have a hemoglobin A1c.  No family history of diabetes.   Review of Systems     Objective:   Physical Exam  Alert and no distress otherwise not examined Hemoglobin A1c is 5.6      Assessment & Plan:  Elevated glucose - Plan: POCT glycosylated hemoglobin (Hb A1C) I explained that she does have a slightly elevated blood sugar but she is not at any risk at this point of becoming a diabetic.

## 2019-12-12 ENCOUNTER — Ambulatory Visit: Payer: Self-pay | Attending: Internal Medicine

## 2019-12-12 DIAGNOSIS — Z23 Encounter for immunization: Secondary | ICD-10-CM | POA: Insufficient documentation

## 2019-12-12 NOTE — Progress Notes (Signed)
   Covid-19 Vaccination Clinic  Name:  Carol Harris    MRN: [id number] DOB: 1956-07-05  12/12/2019  Carol Harris was observed post Covid-19 immunization for 15 minutes without incident. She was provided with Vaccine Information Sheet and instruction to access the V-Safe system.   Carol Harris was instructed to call 911 with any severe reactions post vaccine: Marland Kitchen Difficulty breathing  . Swelling of face and throat  . A fast heartbeat  . A bad rash all over body  . Dizziness and weakness   Immunizations Administered    Name Date Dose VIS Date Route   Pfizer COVID-19 Vaccine 12/12/2019  3:13 PM 0.3 mL 09/21/2019 Intramuscular   Manufacturer: [place]   Lot: [id number]   [place]: [id number]

## 2019-12-17 DIAGNOSIS — F900 Attention-deficit hyperactivity disorder, predominantly inattentive type: Secondary | ICD-10-CM | POA: Diagnosis not present

## 2019-12-17 DIAGNOSIS — F332 Major depressive disorder, recurrent severe without psychotic features: Secondary | ICD-10-CM | POA: Diagnosis not present

## 2019-12-17 DIAGNOSIS — F411 Generalized anxiety disorder: Secondary | ICD-10-CM | POA: Diagnosis not present

## 2019-12-31 DIAGNOSIS — F411 Generalized anxiety disorder: Secondary | ICD-10-CM | POA: Diagnosis not present

## 2019-12-31 DIAGNOSIS — F332 Major depressive disorder, recurrent severe without psychotic features: Secondary | ICD-10-CM | POA: Diagnosis not present

## 2019-12-31 DIAGNOSIS — F900 Attention-deficit hyperactivity disorder, predominantly inattentive type: Secondary | ICD-10-CM | POA: Diagnosis not present

## 2020-01-02 DIAGNOSIS — F900 Attention-deficit hyperactivity disorder, predominantly inattentive type: Secondary | ICD-10-CM | POA: Diagnosis not present

## 2020-01-02 DIAGNOSIS — F411 Generalized anxiety disorder: Secondary | ICD-10-CM | POA: Diagnosis not present

## 2020-01-02 DIAGNOSIS — F332 Major depressive disorder, recurrent severe without psychotic features: Secondary | ICD-10-CM | POA: Diagnosis not present

## 2020-01-14 DIAGNOSIS — F900 Attention-deficit hyperactivity disorder, predominantly inattentive type: Secondary | ICD-10-CM | POA: Diagnosis not present

## 2020-01-14 DIAGNOSIS — F411 Generalized anxiety disorder: Secondary | ICD-10-CM | POA: Diagnosis not present

## 2020-01-14 DIAGNOSIS — F332 Major depressive disorder, recurrent severe without psychotic features: Secondary | ICD-10-CM | POA: Diagnosis not present

## 2020-01-15 ENCOUNTER — Encounter: Payer: Self-pay | Admitting: Family Medicine

## 2020-01-22 ENCOUNTER — Ambulatory Visit (INDEPENDENT_AMBULATORY_CARE_PROVIDER_SITE_OTHER): Payer: PPO | Admitting: Medical

## 2020-01-22 VITALS — Ht 64.5 in

## 2020-01-22 DIAGNOSIS — H1013 Acute atopic conjunctivitis, bilateral: Secondary | ICD-10-CM

## 2020-01-22 MED ORDER — OLOPATADINE HCL 0.2 % OP SOLN: 2.0000 [drp] | Freq: Every day | OPHTHALMIC | 2 refills | Status: DC

## 2020-01-22 NOTE — Progress Notes (Signed)
Subjective:     Patient ID: Carol Harris, female   DOB: [birth year], 64 y.o.   MRN: [id number]  This visit type was conducted due to national recommendations for restrictions regarding the COVID-19 Pandemic (e.g. social distancing) in an effort to limit this patient's exposure and mitigate transmission in our community.  Due to their co-morbid illnesses, this patient is at least at moderate risk for complications without adequate follow up.  This format is felt to be most appropriate for this patient at this time.    Documentation for virtual audio and video telecommunications through Zoom encounter:  The patient was located at home. The provider was located in the office. The patient did consent to this visit and is aware of possible charges through their insurance for this visit.  The other persons participating in this telemedicine service were none. Time spent on call was 20 minutes and in review of previous records 20 minutes total.  This virtual service is not related to other E/M service within previous 7 days.   HPI Chief Complaint  Patient presents with  . Itchy Eye    allergy related    Has never had a lot of allergy problems, however she is having a lot of eye symptoms that she attributes to allergies.  She denies runny nose, sore throat, cough, congestion.  She has mainly had several days of itchy eyes, raw feeling eyes, red eyes.  Not particularly watery and no pus drainage.  No vision change no loss of vision.  No headache.  She realizes pollen is horrible now.  Eyelids seem to swell at times.   Wearing glasses currently due to fear of contacts getting coated and have trouble getting those out.  She wears see me permeable contacts.   She started taking some off brand Claritin 10mg  24 hour pill.  This helps some. No other aggravating or relieving factors. No other complaint.   Past Medical History:  Diagnosis Date  . Anxiety   . Colon polyp   . GERD (gastroesophageal  reflux disease)   . HTN (hypertension), benign 09/05/2011  . Hyperlipidemia, mixed 09/05/2011  . Leukopenia 09/05/2011   Current Outpatient Medications on File Prior to Visit  Medication Sig Dispense Refill  . acetaminophen (TYLENOL) 500 MG tablet Take 500 mg by mouth every 6 (six) hours as needed.    Marland Kitchen aspirin EC 81 MG tablet Take 81 mg by mouth daily.    Marland Kitchen atenolol (TENORMIN) 25 MG tablet Take 1 tablet (25 mg total) by mouth 2 (two) times daily. 180 tablet 3  . atorvastatin (LIPITOR) 40 MG tablet TAKE 1 TABLET BY MOUTH EVERYDAY AT BEDTIME 90 tablet 3  . buPROPion (WELLBUTRIN SR) 150 MG 12 hr tablet Take 150 mg by mouth 3 (three) times daily.     . busPIRone (BUSPAR) 10 MG tablet Take 5-10 mg by mouth 2 (two) times daily.    . cholecalciferol (VITAMIN D) 1000 UNITS tablet Take 5,000 Units by mouth daily.     . cyanocobalamin 100 MCG tablet Take 1,000 mcg by mouth every other day.     . losartan (COZAAR) 50 MG tablet Take 1 tablet (50 mg total) by mouth daily. 90 tablet 3  . MAGNESIUM PO Take by mouth.    . clopidogrel (PLAVIX) 75 MG tablet TAKE 1 TABLET (75 MG TOTAL) BY MOUTH DAILY. (Patient not taking: Reported on 11/03/2018) 30 tablet 0  . Pseudoephedrine HCl (SUDAFED PO) Take 30 mg by mouth.  No current facility-administered medications on file prior to visit.     Review of Systems As in subjective    Objective:   Physical Exam Due to coronavirus pandemic stay at home measures, patient visit was virtual and they were not examined in person.   Ht 5' 4.5" (1.638 m)   BMI 28.49 kg/m       Assessment:     Encounter Diagnosis  Name Primary?  . Allergic conjunctivitis of both eyes Yes       Plan:     We discussed the limitations of virtual consult.  We discussed her symptoms and concerns.  She will begin eyedrops below.  She can continue Claritin for the time being.  We discussed hygiene, bathing daily to wash off pollen from the body and face.  He can consider saving  eye rinse prior to putting in the eyedrop medication if needed.  If not much improved within the next 4 to 5 days advise she call back to discuss.  We discussed other diagnoses in the differential for eye discomfort.  Advised if any frank eye pain, loss of vision or pus drainage to call back or go to the eye doctor.    [place] was seen today for itchy eye.  Diagnoses and all orders for this visit:  Allergic conjunctivitis of both eyes  Other orders -     Olopatadine HCl 0.2 % SOLN; Apply 2 drops to eye daily.

## 2020-02-04 DIAGNOSIS — F332 Major depressive disorder, recurrent severe without psychotic features: Secondary | ICD-10-CM | POA: Diagnosis not present

## 2020-02-04 DIAGNOSIS — F411 Generalized anxiety disorder: Secondary | ICD-10-CM | POA: Diagnosis not present

## 2020-02-04 DIAGNOSIS — F900 Attention-deficit hyperactivity disorder, predominantly inattentive type: Secondary | ICD-10-CM | POA: Diagnosis not present

## 2020-02-19 DIAGNOSIS — F332 Major depressive disorder, recurrent severe without psychotic features: Secondary | ICD-10-CM | POA: Diagnosis not present

## 2020-02-19 DIAGNOSIS — F411 Generalized anxiety disorder: Secondary | ICD-10-CM | POA: Diagnosis not present

## 2020-02-19 DIAGNOSIS — F900 Attention-deficit hyperactivity disorder, predominantly inattentive type: Secondary | ICD-10-CM | POA: Diagnosis not present

## 2020-03-17 ENCOUNTER — Telehealth: Payer: Self-pay

## 2020-03-17 NOTE — Telephone Encounter (Signed)
Pt was called back to advise of number to call [employer] for C PAP supplies . They can be reached at [phone]. [place]

## 2020-03-24 ENCOUNTER — Ambulatory Visit (INDEPENDENT_AMBULATORY_CARE_PROVIDER_SITE_OTHER): Payer: PPO | Admitting: Family Medicine

## 2020-03-24 ENCOUNTER — Other Ambulatory Visit: Payer: Self-pay

## 2020-03-24 ENCOUNTER — Encounter: Payer: Self-pay | Admitting: Family Medicine

## 2020-03-24 VITALS — BP 140/90 | HR 72 | Temp 98.4°F | Ht 64.75 in | Wt 166.0 lb

## 2020-03-24 DIAGNOSIS — F332 Major depressive disorder, recurrent severe without psychotic features: Secondary | ICD-10-CM | POA: Diagnosis not present

## 2020-03-24 DIAGNOSIS — I10 Essential (primary) hypertension: Secondary | ICD-10-CM

## 2020-03-24 DIAGNOSIS — R0981 Nasal congestion: Secondary | ICD-10-CM

## 2020-03-24 DIAGNOSIS — M79602 Pain in left arm: Secondary | ICD-10-CM | POA: Diagnosis not present

## 2020-03-24 DIAGNOSIS — F411 Generalized anxiety disorder: Secondary | ICD-10-CM | POA: Diagnosis not present

## 2020-03-24 DIAGNOSIS — F900 Attention-deficit hyperactivity disorder, predominantly inattentive type: Secondary | ICD-10-CM | POA: Diagnosis not present

## 2020-03-24 NOTE — Progress Notes (Signed)
Chief Complaint  Patient presents with  . Hypertension    blood pressure has been running high for a few weeks and has been having some left arm pain.    64 yo female with h/o HTN, CVA, OSA presents with concern of high blood pressure and L arm pain.    Last week, after returning from vacation she started a new job (not by choice, her whole team was moved).  +work stress.  BP's are usually 120's/70's.  Last week, 4 days ago, she had a terrible headache, so she checked her BP and it was 170/115.  She took an extra atenolol and laid down.  Recheck an hour later was lower, and 3-4 hours later it was down to 140/90. Headache was at the top of her head, didn't feel like her sinuses. She was busy with company over the weekend, didn't check BP, didn't have headaches since Thursday.  She has had some L arm pain--started around the time of her headache (last Thursday).  Last night she woke up with L arm pain at 3 am.  BP was 169/98.  She took an extra atenolol at that time. She did not have a headache. Pain is at the upper arm, laterally.  Not tender to touch. Describes as a "heaviness".   She has some twinges of pain in her neck. She has had numbness in arms in the past, resolves if she gets rid of her pillow.    Her nurse daughter told her to get checked out, due to her h/o stroke.  Pulse usually runs in the 50's. Denies changes in sodium intake--ate out on vacation, but mainly salads. Some feta on the salads.  Mainly vegetarian, just some fish, not fried.  This year her allergies flared.  She had more nasal congestion, and couldn't wear her contacts related to eye allergies.  She has used sudafed all of her life as needed, usually just once a month, not allergy medications. She took sudafed last week, before going to bed, for slight nasal congestion.  She has issues with sinus headaches, and septal deviation.  She uses CPAP.  PMH, PSH, SH reviewed  Outpatient Encounter Medications as of  03/24/2020  Medication Sig Note  . aspirin EC 81 MG tablet Take 81 mg by mouth daily.   Marland Kitchen atenolol (TENORMIN) 25 MG tablet Take 1 tablet (25 mg total) by mouth 2 (two) times daily.   Marland Kitchen atorvastatin (LIPITOR) 40 MG tablet TAKE 1 TABLET BY MOUTH EVERYDAY AT BEDTIME   . buPROPion (WELLBUTRIN XL) 150 MG 24 hr tablet Take 450 mg by mouth every morning.   . Cholecalciferol (VITAMIN D3) 250 MCG (10000 UT) TABS Take 1 tablet by mouth daily. 03/24/2020: 5 days a week  . cyanocobalamin 100 MCG tablet Take 1,000 mcg by mouth every other day.    . losartan (COZAAR) 50 MG tablet Take 1 tablet (50 mg total) by mouth daily.   Marland Kitchen MAGNESIUM PO Take by mouth.   . Pseudoephedrine HCl (SUDAFED PO) Take 30 mg by mouth. 03/24/2020: Took it last week  . acetaminophen (TYLENOL) 500 MG tablet Take 500 mg by mouth every 6 (six) hours as needed. (Patient not taking: Reported on 03/24/2020)   . busPIRone (BUSPAR) 10 MG tablet Take 5-10 mg by mouth 2 (two) times daily. (Patient not taking: Reported on 03/24/2020) 03/24/2020: Tried it once, made her loopy  . Olopatadine HCl 0.2 % SOLN Apply 2 drops to eye daily. (Patient not taking: Reported on 03/24/2020)   . [  DISCONTINUED] buPROPion (WELLBUTRIN SR) 150 MG 12 hr tablet Take 150 mg by mouth 3 (three) times daily.    . [DISCONTINUED] cholecalciferol (VITAMIN D) 1000 UNITS tablet Take 5,000 Units by mouth daily.    . [DISCONTINUED] clopidogrel (PLAVIX) 75 MG tablet TAKE 1 TABLET (75 MG TOTAL) BY MOUTH DAILY. (Patient not taking: Reported on 11/03/2018)    No facility-administered encounter medications on file as of 03/24/2020.   No Known Allergies  ROS:  No fever, chills, chest pain, palpitations, shortness of breath, nausea, vomiting, heartburn, abdominal pain, bowel changes.  Currently without headache.  Denies dizziness.  Intermittent nasal congestion. No edema.  No bleeding, bruising, rash. See HPI   PHYSICAL EXAM:  BP 140/90   Pulse 72   Temp 98.4 F (36.9 C) (Temporal)    Ht 5' 4.75" (1.645 m)   Wt 166 lb (75.3 kg)   BMI 27.84 kg/m   144/80 on repeat by MD  Well-appearing, slightly anxious female, in no distress HEENT: conjunctiva and sclera are clear, EOMI. Wearing mask Neck: no lymphadenopathy, thyromegaly or mass, no bruit Heart: regular rate and rhythm (in the 60's on my exam), no murmur Lungs: clear bilaterally Back: no spinal or CVA tenderness Abdomen: soft, nontender, no organomegaly or mass Extremities: no edema, normal pulses.  nontender on LUE. Neuro: alert and oriented. Normal strength, sensation, DTR's 2+ and symmetric.  Normal gait.  EKG: sinus bradycardia, rate of 59. No e/o ischemia or acute abnormality.   ASSESSMENT/PLAN:  HTN (hypertension), benign - Elevated recently, possible related to work stress. Exercise, low Na diet. Increase losartan to 73m, no extra atenolol.  monitor and f/u with JCL in 2-3 wks - Plan: EKG 12-Lead  Left arm pain - reassure no e/o CAD/ischemia. - Plan: EKG 12-Lead  Nasal congestion - avoid decongestants which raise BP. Can use afrin prior to CPAP. Consider allergy meds, discussed in detail  Discussed her BP's--given her h/o stroke, and the fact that she has been taking extra atenolol (that her pulse may not tolerate), will instead have her increase her losartan to 752mdaily (1.5 tablets). She will monitor BP closely, record and bring list to f/u in 2-3 weeks.  Will need f/u b-met at that time (if stays on the higher dose of ARB).  Consider trying Afrin (neosinephrine) nasal spray just prior to putting on your CPAP if your nose is stuffy/congested.  Do not use it other times of the day.  Avoid sudafed, as this raises your blood pressure.  Consider sinus rinses and mucinex, in addition to claritin to help treat allergies.

## 2020-03-24 NOTE — Patient Instructions (Signed)
Consider trying Afrin (neosinephrine) nasal spray just prior to putting on your CPAP if your nose is stuffy/congested.  Do not use it other times of the day.  Avoid sudafed, as this raises your blood pressure.  Consider sinus rinses and mucinex, in addition to claritin to help treat allergies when they are flaring.  If this doesn't help with your nasal symptoms, you can try Flonase.  Increase the losartan to 1.5 tablets daily (75mg  dose), and continue your atenolol 25mg  twice daily.  Do not take extra if blood pressure is high.  Limit the salt in the diet, as we discussed (see below). Work on stress reduction and getting regular exercise.  Monitor your blood pressure regularly, and record on the paper provided.  Bring this to your follow-up visit with Dr. Redmond School.   Low-Sodium Eating Plan Sodium, which is an element that makes up salt, helps you maintain a healthy balance of fluids in your body. Too much sodium can increase your blood pressure and cause fluid and waste to be held in your body. Your health care provider or dietitian may recommend following this plan if you have high blood pressure (hypertension), kidney disease, liver disease, or heart failure. Eating less sodium can help lower your blood pressure, reduce swelling, and protect your heart, liver, and kidneys. What are tips for following this plan? General guidelines  Most people on this plan should limit their sodium intake to 1,500-2,000 mg (milligrams) of sodium each day. Reading food labels   The Nutrition Facts label lists the amount of sodium in one serving of the food. If you eat more than one serving, you must multiply the listed amount of sodium by the number of servings.  Choose foods with less than 140 mg of sodium per serving.  Avoid foods with 300 mg of sodium or more per serving. Shopping  Look for lower-sodium products, often labeled as "low-sodium" or "no salt added."  Always check the sodium content  even if foods are labeled as "unsalted" or "no salt added".  Buy fresh foods. ? Avoid canned foods and premade or frozen meals. ? Avoid canned, cured, or processed meats  Buy breads that have less than 80 mg of sodium per slice. Cooking  Eat more home-cooked food and less restaurant, buffet, and fast food.  Avoid adding salt when cooking. Use salt-free seasonings or herbs instead of table salt or sea salt. Check with your health care provider or pharmacist before using salt substitutes.  Cook with plant-based oils, such as canola, sunflower, or olive oil. Meal planning  When eating at a restaurant, ask that your food be prepared with less salt or no salt, if possible.  Avoid foods that contain MSG (monosodium glutamate). MSG is sometimes added to [place] food, bouillon, and some canned foods. What foods are recommended? The items listed may not be a complete list. Talk with your dietitian about what dietary choices are best for you. Grains Low-sodium cereals, including oats, puffed wheat and rice, and shredded wheat. Low-sodium crackers. Unsalted rice. Unsalted pasta. Low-sodium bread. Whole-grain breads and whole-grain pasta. Vegetables Fresh or frozen vegetables. "No salt added" canned vegetables. "No salt added" tomato sauce and paste. Low-sodium or reduced-sodium tomato and vegetable juice. Fruits Fresh, frozen, or canned fruit. Fruit juice. Meats and other protein foods Fresh or frozen (no salt added) meat, poultry, seafood, and fish. Low-sodium canned tuna and salmon. Unsalted nuts. Dried peas, beans, and lentils without added salt. Unsalted canned beans. Eggs. Unsalted nut butters. Dairy Milk.  Soy milk. Cheese that is naturally low in sodium, such as ricotta cheese, fresh mozzarella, or Swiss cheese Low-sodium or reduced-sodium cheese. Cream cheese. Yogurt. Fats and oils Unsalted butter. Unsalted margarine with no trans fat. Vegetable oils such as canola or olive  oils. Seasonings and other foods Fresh and dried herbs and spices. Salt-free seasonings. Low-sodium mustard and ketchup. Sodium-free salad dressing. Sodium-free light mayonnaise. Fresh or refrigerated horseradish. Lemon juice. Vinegar. Homemade, reduced-sodium, or low-sodium soups. Unsalted popcorn and pretzels. Low-salt or salt-free chips. What foods are not recommended? The items listed may not be a complete list. Talk with your dietitian about what dietary choices are best for you. Grains Instant hot cereals. Bread stuffing, pancake, and biscuit mixes. Croutons. Seasoned rice or pasta mixes. Noodle soup cups. Boxed or frozen macaroni and cheese. Regular salted crackers. Self-rising flour. Vegetables Sauerkraut, pickled vegetables, and relishes. Olives. [place] fries. Onion rings. Regular canned vegetables (not low-sodium or reduced-sodium). Regular canned tomato sauce and paste (not low-sodium or reduced-sodium). Regular tomato and vegetable juice (not low-sodium or reduced-sodium). Frozen vegetables in sauces. Meats and other protein foods Meat or fish that is salted, canned, smoked, spiced, or pickled. Bacon, ham, sausage, hotdogs, corned beef, chipped beef, packaged lunch meats, salt pork, jerky, pickled herring, anchovies, regular canned tuna, sardines, salted nuts. Dairy Processed cheese and cheese spreads. Cheese curds. Blue cheese. Feta cheese. String cheese. Regular cottage cheese. Buttermilk. Canned milk. Fats and oils Salted butter. Regular margarine. Ghee. Bacon fat. Seasonings and other foods Onion salt, garlic salt, seasoned salt, table salt, and sea salt. Canned and packaged gravies. Worcestershire sauce. Tartar sauce. Barbecue sauce. Teriyaki sauce. Soy sauce, including reduced-sodium. Steak sauce. Fish sauce. Oyster sauce. Cocktail sauce. Horseradish that you find on the shelf. Regular ketchup and mustard. Meat flavorings and tenderizers. Bouillon cubes. Hot sauce and Tabasco sauce.  Premade or packaged marinades. Premade or packaged taco seasonings. Relishes. Regular salad dressings. Salsa. Potato and tortilla chips. Corn chips and puffs. Salted popcorn and pretzels. Canned or dried soups. Pizza. Frozen entrees and pot pies. Summary  Eating less sodium can help lower your blood pressure, reduce swelling, and protect your heart, liver, and kidneys.  Most people on this plan should limit their sodium intake to 1,500-2,000 mg (milligrams) of sodium each day.  Canned, boxed, and frozen foods are high in sodium. Restaurant foods, fast foods, and pizza are also very high in sodium. You also get sodium by adding salt to food.  Try to cook at home, eat more fresh fruits and vegetables, and eat less fast food, canned, processed, or prepared foods. This information is not intended to replace advice given to you by your health care provider. Make sure you discuss any questions you have with your health care provider. Document Revised: 09/09/2017 Document Reviewed: 09/20/2016 Elsevier Patient Education  2020 [place].   Mindfulness-Based Stress Reduction Mindfulness-based stress reduction (MBSR) is a program that helps people learn to practice mindfulness. Mindfulness is the practice of intentionally paying attention to the present moment. It can be learned and practiced through techniques such as education, breathing exercises, meditation, and yoga. MBSR includes several mindfulness techniques in one program. MBSR works best when you understand the treatment, are willing to try new things, and can commit to spending time practicing what you learn. MBSR training may include learning about:  How your emotions, thoughts, and reactions affect your body.  New ways to respond to things that cause negative thoughts to start (triggers).  How to notice your thoughts  and let go of them.  Practicing awareness of everyday things that you normally do without thinking.  The techniques  and goals of different types of meditation. What are the benefits of MBSR? MBSR can have many benefits, which include helping you to:  Develop self-awareness. This refers to knowing and understanding yourself.  Learn skills and attitudes that help you to participate in your own health care.  Learn new ways to care for yourself.  Be more accepting about how things are, and let things go.  Be less judgmental and approach things with an open mind.  Be patient with yourself and trust yourself more. MBSR has also been shown to:  Reduce negative emotions, such as depression and anxiety.  Improve memory and focus.  Change how you sense and approach pain.  Boost your body's ability to fight infections.  Help you connect better with other people.  Improve your sense of well-being. Follow these instructions at home:   Find a local in-person or online MBSR program.  Set aside some time regularly for mindfulness practice.  Find a mindfulness practice that works best for you. This may include one or more of the following: ? Meditation. Meditation involves focusing your mind on a certain thought or activity. ? Breathing awareness exercises. These help you to stay present by focusing on your breath. ? Body scan. For this practice, you lie down and pay attention to each part of your body from head to toe. You can identify tension and soreness and intentionally relax parts of your body. ? Yoga. Yoga involves stretching and breathing, and it can improve your ability to move and be flexible. It can also provide an experience of testing your body's limits, which can help you release stress. ? Mindful eating. This way of eating involves focusing on the taste, texture, color, and smell of each bite of food. Because this slows down eating and helps you feel full sooner, it can be an important part of a weight-loss plan.  Find a podcast or recording that provides guidance for breathing awareness,  body scan, or meditation exercises. You can listen to these any time when you have a free moment to rest without distractions.  Follow your treatment plan as told by your health care provider. This may include taking regular medicines and making changes to your diet or lifestyle as recommended. How to practice mindfulness To do a basic awareness exercise:  Find a comfortable place to sit.  Pay attention to the present moment. Observe your thoughts, feelings, and surroundings just as they are.  Avoid placing judgment on yourself, your feelings, or your surroundings. Make note of any judgment that comes up, and let it go.  Your mind may wander, and that is okay. Make note of when your thoughts drift, and return your attention to the present moment. To do basic mindfulness meditation:  Find a comfortable place to sit. This may include a stable chair or a firm floor cushion. ? Sit upright with your back straight. Let your arms fall next to your side with your hands resting on your legs. ? If sitting in a chair, rest your feet flat on the floor. ? If sitting on a cushion, cross your legs in front of you.  Keep your head in a neutral position with your chin dropped slightly. Relax your jaw and rest the tip of your tongue on the roof of your mouth. Drop your gaze to the floor. You can close your eyes if you like.  Breathe normally and pay attention to your breath. Feel the air moving in and out of your nose. Feel your belly expanding and relaxing with each breath.  Your mind may wander, and that is okay. Make note of when your thoughts drift, and return your attention to your breath.  Avoid placing judgment on yourself, your feelings, or your surroundings. Make note of any judgment or feelings that come up, let them go, and bring your attention back to your breath.  When you are ready, lift your gaze or open your eyes. Pay attention to how your body feels after the meditation. Where to find  more information You can find more information about MBSR from:  Your health care provider.  Community-based meditation centers or programs.  Programs offered near you. Summary  Mindfulness-based stress reduction (MBSR) is a program that teaches you how to intentionally pay attention to the present moment. It is used with other treatments to help you cope better with daily stress, emotions, and pain.  MBSR focuses on developing self-awareness, which allows you to respond to life stress without judgment or negative emotions.  MBSR programs may involve learning different mindfulness practices, such as breathing exercises, meditation, yoga, body scan, or mindful eating. Find a mindfulness practice that works best for you, and set aside time for it on a regular basis. This information is not intended to replace advice given to you by your health care provider. Make sure you discuss any questions you have with your health care provider. Document Revised: 09/09/2017 Document Reviewed: 02/03/2017 Elsevier Patient Education  [place].

## 2020-03-25 DIAGNOSIS — G4733 Obstructive sleep apnea (adult) (pediatric): Secondary | ICD-10-CM | POA: Diagnosis not present

## 2020-04-08 ENCOUNTER — Ambulatory Visit: Payer: PPO | Admitting: Family Medicine

## 2020-04-17 ENCOUNTER — Ambulatory Visit: Payer: PPO | Admitting: Family Medicine

## 2020-04-21 DIAGNOSIS — F411 Generalized anxiety disorder: Secondary | ICD-10-CM | POA: Diagnosis not present

## 2020-04-21 DIAGNOSIS — F332 Major depressive disorder, recurrent severe without psychotic features: Secondary | ICD-10-CM | POA: Diagnosis not present

## 2020-04-21 DIAGNOSIS — F900 Attention-deficit hyperactivity disorder, predominantly inattentive type: Secondary | ICD-10-CM | POA: Diagnosis not present

## 2020-04-29 ENCOUNTER — Encounter: Payer: Self-pay | Admitting: Family Medicine

## 2020-04-29 ENCOUNTER — Ambulatory Visit (INDEPENDENT_AMBULATORY_CARE_PROVIDER_SITE_OTHER): Payer: PPO | Admitting: Family Medicine

## 2020-04-29 ENCOUNTER — Other Ambulatory Visit: Payer: Self-pay

## 2020-04-29 VITALS — BP 150/88 | HR 63 | Temp 98.6°F | Wt 165.8 lb

## 2020-04-29 DIAGNOSIS — I10 Essential (primary) hypertension: Secondary | ICD-10-CM

## 2020-04-29 NOTE — Progress Notes (Signed)
   Subjective:    Patient ID: Carol Harris, female    DOB: 1955-11-09, 64 y.o.   MRN: [id number]  HPI She is here for a recheck.  She is now taking losartan 1-1/2 pills/day.  She has been checking her blood pressure at home and the numbers she brought in her looks quite good.  She has not measured against ours.  She has had difficulty with stress and when headaches and I explained that the headaches are not coming from her blood pressure.   Review of Systems     Objective:   Physical Exam Alert and in no distress.  Her blood pressure is recorded.  I did review the one she had from home and they are in the 528 range systolic.       Assessment & Plan:  HTN (hypertension), benign She is to bring her blood pressure cuff and to measure it against ours.  I explained that we could be dealing with whitecoat hypertension and before making any further dietary adjustments, we need to make sure her blood pressure cuff at home is accurate as she is doing it correctly.  She was comfortable with that.

## 2020-05-01 DIAGNOSIS — F332 Major depressive disorder, recurrent severe without psychotic features: Secondary | ICD-10-CM | POA: Diagnosis not present

## 2020-05-01 DIAGNOSIS — F411 Generalized anxiety disorder: Secondary | ICD-10-CM | POA: Diagnosis not present

## 2020-05-01 DIAGNOSIS — F900 Attention-deficit hyperactivity disorder, predominantly inattentive type: Secondary | ICD-10-CM | POA: Diagnosis not present

## 2020-05-07 DIAGNOSIS — Z1231 Encounter for screening mammogram for malignant neoplasm of breast: Secondary | ICD-10-CM | POA: Diagnosis not present

## 2020-05-07 LAB — HM MAMMOGRAPHY

## 2020-05-12 ENCOUNTER — Encounter: Payer: Self-pay | Admitting: Family Medicine

## 2020-05-27 DIAGNOSIS — F411 Generalized anxiety disorder: Secondary | ICD-10-CM | POA: Diagnosis not present

## 2020-05-27 DIAGNOSIS — F900 Attention-deficit hyperactivity disorder, predominantly inattentive type: Secondary | ICD-10-CM | POA: Diagnosis not present

## 2020-05-27 DIAGNOSIS — F332 Major depressive disorder, recurrent severe without psychotic features: Secondary | ICD-10-CM | POA: Diagnosis not present

## 2020-06-17 DIAGNOSIS — Z01419 Encounter for gynecological examination (general) (routine) without abnormal findings: Secondary | ICD-10-CM | POA: Diagnosis not present

## 2020-07-17 DIAGNOSIS — M85851 Other specified disorders of bone density and structure, right thigh: Secondary | ICD-10-CM | POA: Diagnosis not present

## 2020-07-17 DIAGNOSIS — Z780 Asymptomatic menopausal state: Secondary | ICD-10-CM | POA: Diagnosis not present

## 2020-07-17 DIAGNOSIS — M85852 Other specified disorders of bone density and structure, left thigh: Secondary | ICD-10-CM | POA: Diagnosis not present

## 2020-07-17 LAB — HM DEXA SCAN

## 2020-08-04 DIAGNOSIS — F332 Major depressive disorder, recurrent severe without psychotic features: Secondary | ICD-10-CM | POA: Diagnosis not present

## 2020-08-04 DIAGNOSIS — F900 Attention-deficit hyperactivity disorder, predominantly inattentive type: Secondary | ICD-10-CM | POA: Diagnosis not present

## 2020-08-04 DIAGNOSIS — F411 Generalized anxiety disorder: Secondary | ICD-10-CM | POA: Diagnosis not present

## 2020-08-09 ENCOUNTER — Other Ambulatory Visit: Payer: Self-pay | Admitting: Family Medicine

## 2020-08-09 DIAGNOSIS — I10 Essential (primary) hypertension: Secondary | ICD-10-CM

## 2020-09-11 DIAGNOSIS — F900 Attention-deficit hyperactivity disorder, predominantly inattentive type: Secondary | ICD-10-CM | POA: Diagnosis not present

## 2020-09-11 DIAGNOSIS — F411 Generalized anxiety disorder: Secondary | ICD-10-CM | POA: Diagnosis not present

## 2020-09-11 DIAGNOSIS — F332 Major depressive disorder, recurrent severe without psychotic features: Secondary | ICD-10-CM | POA: Diagnosis not present

## 2020-09-25 DIAGNOSIS — G4733 Obstructive sleep apnea (adult) (pediatric): Secondary | ICD-10-CM | POA: Diagnosis not present

## 2020-11-04 DIAGNOSIS — F900 Attention-deficit hyperactivity disorder, predominantly inattentive type: Secondary | ICD-10-CM | POA: Diagnosis not present

## 2020-11-04 DIAGNOSIS — F332 Major depressive disorder, recurrent severe without psychotic features: Secondary | ICD-10-CM | POA: Diagnosis not present

## 2020-11-04 DIAGNOSIS — F411 Generalized anxiety disorder: Secondary | ICD-10-CM | POA: Diagnosis not present

## 2020-11-10 ENCOUNTER — Other Ambulatory Visit: Payer: Self-pay | Admitting: Family Medicine

## 2020-11-10 DIAGNOSIS — I10 Essential (primary) hypertension: Secondary | ICD-10-CM

## 2020-11-13 ENCOUNTER — Encounter: Payer: PPO | Admitting: Family Medicine

## 2020-12-31 DIAGNOSIS — F332 Major depressive disorder, recurrent severe without psychotic features: Secondary | ICD-10-CM | POA: Diagnosis not present

## 2020-12-31 DIAGNOSIS — F900 Attention-deficit hyperactivity disorder, predominantly inattentive type: Secondary | ICD-10-CM | POA: Diagnosis not present

## 2020-12-31 DIAGNOSIS — F411 Generalized anxiety disorder: Secondary | ICD-10-CM | POA: Diagnosis not present

## 2021-01-01 ENCOUNTER — Other Ambulatory Visit: Payer: Self-pay | Admitting: Family Medicine

## 2021-01-01 DIAGNOSIS — E782 Mixed hyperlipidemia: Secondary | ICD-10-CM

## 2021-01-20 NOTE — Patient Instructions (Addendum)
HEALTH MAINTENANCE RECOMMENDATIONS:  It is recommended that you get at least 30 minutes of aerobic exercise at least 5 days/week (for weight loss, you may need as much as 60-90 minutes). This can be any activity that gets your heart rate up. This can be divided in 10-15 minute intervals if needed, but try and build up your endurance at least once a week.  Weight bearing exercise is also recommended twice weekly.  Eat a healthy diet with lots of vegetables, fruits and fiber.  "Colorful" foods have a lot of vitamins (ie green vegetables, tomatoes, red peppers, etc).  Limit sweet tea, regular sodas and alcoholic beverages, all of which has a lot of calories and sugar.  Up to 1 alcoholic drink daily may be beneficial for women (unless trying to lose weight, watch sugars).  Drink a lot of water.  Calcium recommendations are 1200-1500 mg daily (1500 mg for postmenopausal women or women without ovaries), and vitamin D 1000 IU daily.  This should be obtained from diet and/or supplements (vitamins), and calcium should not be taken all at once, but in divided doses.  Monthly self breast exams and yearly mammograms for women over the age of 66 is recommended.  Sunscreen of at least SPF 30 should be used on all sun-exposed parts of the skin when outside between the hours of 10 am and 4 pm (not just when at beach or pool, but even with exercise, golf, tennis, and yard work!)  Use a sunscreen that says "broad spectrum" so it covers both UVA and UVB rays, and make sure to reapply every 1-2 hours.  Remember to change the batteries in your smoke detectors when changing your clock times in the spring and fall. Carbon monoxide detectors are recommended for your home.  Use your seat belt every time you are in a car, and please drive safely and not be distracted with cell phones and texting while driving.   [facility] , Thank you for taking time to come for your Medicare Wellness Visit. I appreciate your ongoing  commitment to your health goals. Please review the following plan we discussed and let me know if I can assist you in the future.    This is a list of the screening recommended for you and due dates:  Health Maintenance  Topic Date Due  . DEXA scan (bone density measurement)  Never done  . COVID-19 Vaccine (3 - Booster for Pfizer series) 06/13/2020  . Tetanus Vaccine  04/20/2021  . Flu Shot  05/11/2021  . Mammogram  05/07/2021  . Colon Cancer Screening  12/03/2024--wrong  .  Hepatitis C: One time screening is recommended by Center for Disease Control  (CDC) for  adults born from [birth year] through 1967.   Completed  . HPV Vaccine  Aged Out  . Pneumonia vaccines  Never   The chart above only reflects information we have in your [facility] chart. We do not have any records from your GYN--do not know your last pap smear or if/when you had a bone density test. We will try and get these records.  COVID booster date was only entered today, and you got your 2nd booster today.  You are up to date.  You will be due for a tetanus booster in July. I recommend that you get TdaP from your pharmacy (it is covered by Medicare Part D, NOT covered if we give it on our office).  You are due for colon cancer screening with Dr. Earlean Shawl due to your  family history.  Please call them and schedule it.  Please get [place] copies of your living will and healthcare power of attorney at your convenience (to be scanned into your chart).   Food Choices for Gastroesophageal Reflux Disease, Adult When you have gastroesophageal reflux disease (GERD), the foods you eat and your eating habits are very important. Choosing the right foods can help ease the discomfort of GERD. Consider working with a dietitian to help you make healthy food choices. What are tips for following this plan? Reading food labels  Look for foods that are low in saturated fat. Foods that have less than 5% of daily value (DV) of fat and 0 g of trans fats may help  with your symptoms. Cooking  Cook foods using methods other than frying. This may include baking, steaming, grilling, or broiling. These are all methods that do not need a lot of fat for cooking.  To add flavor, try to use herbs that are low in spice and acidity. Meal planning  Choose healthy foods that are low in fat, such as fruits, vegetables, whole grains, low-fat dairy products, lean meats, fish, and poultry.  Eat frequent, small meals instead of three large meals each day. Eat your meals slowly, in a relaxed setting. Avoid bending over or lying down until 2-3 hours after eating.  Limit high-fat foods such as fatty meats or fried foods.  Limit your intake of fatty foods, such as oils, butter, and shortening.  Avoid the following as told by your health care provider: ? Foods that cause symptoms. These may be different for different people. Keep a food diary to keep track of foods that cause symptoms. ? Alcohol. ? Drinking large amounts of liquid with meals. ? Eating meals during the 2-3 hours before bed.   Lifestyle  Maintain a healthy weight. Ask your health care provider what weight is healthy for you. If you need to lose weight, work with your health care provider to do so safely.  Exercise for at least 30 minutes on 5 or more days each week, or as told by your health care provider.  Avoid wearing clothes that fit tightly around your waist and chest.  Do not use any products that contain nicotine or tobacco. These products include cigarettes, chewing tobacco, and vaping devices, such as e-cigarettes. If you need help quitting, ask your health care provider.  Sleep with the head of your bed raised. Use a wedge under the mattress or blocks under the bed frame to raise the head of the bed.  Chew sugar-free gum after mealtimes. What foods should I eat? Eat a healthy, well-balanced diet of fruits, vegetables, whole grains, low-fat dairy products, lean meats, fish, and poultry.  Each person is different. Foods that may trigger symptoms in one person may not trigger any symptoms in another person. Work with your health care provider to identify foods that are safe for you. The items listed above may not be a complete list of recommended foods and beverages. Contact a dietitian for more information.   What foods should I avoid? Limiting some of these foods may help manage the symptoms of GERD. Everyone is different. Consult a dietitian or your health care provider to help you identify the exact foods to avoid, if any. Fruits Any fruits prepared with added fat. Any fruits that cause symptoms. For some people this may include citrus fruits, such as oranges, grapefruit, pineapple, and lemons. Vegetables Deep-fried vegetables. [place] fries. Any vegetables prepared with added fat.  Any vegetables that cause symptoms. For some people, this may include tomatoes and tomato products, chili peppers, onions and garlic, and horseradish. Grains Pastries or quick breads with added fat. Meats and other proteins High-fat meats, such as fatty beef or pork, hot dogs, ribs, ham, sausage, salami, and bacon. Fried meat or protein, including fried fish and fried chicken. Nuts and nut butters, in large amounts. Dairy Whole milk and chocolate milk. Sour cream. Cream. Ice cream. Cream cheese. Milkshakes. Fats and oils Butter. Margarine. Shortening. Ghee. Beverages Coffee and tea, with or without caffeine. Carbonated beverages. Sodas. Energy drinks. Fruit juice made with acidic fruits, such as orange or grapefruit. Tomato juice. Alcoholic drinks. Sweets and desserts Chocolate and cocoa. Donuts. Seasonings and condiments Pepper. Peppermint and spearmint. Added salt. Any condiments, herbs, or seasonings that cause symptoms. For some people, this may include curry, hot sauce, or vinegar-based salad dressings. The items listed above may not be a complete list of foods and beverages to avoid. Contact a  dietitian for more information. Questions to ask your health care provider Diet and lifestyle changes are usually the first steps that are taken to manage symptoms of GERD. If diet and lifestyle changes do not improve your symptoms, talk with your health care provider about taking medicines. Where to find more information  International Foundation for Gastrointestinal Disorders: aboutgerd.org Summary  When you have gastroesophageal reflux disease (GERD), food and lifestyle choices may be very helpful in easing the discomfort of GERD.  Eat frequent, small meals instead of three large meals each day. Eat your meals slowly, in a relaxed setting. Avoid bending over or lying down until 2-3 hours after eating.  Limit high-fat foods such as fatty meats or fried foods. This information is not intended to replace advice given to you by your health care provider. Make sure you discuss any questions you have with your health care provider. Document Revised: 04/07/2020 Document Reviewed: 04/07/2020 Elsevier Patient Education  [place].  Cholesterol Content in Foods Cholesterol is a waxy, fat-like substance that helps to carry fat in the blood. The body needs cholesterol in small amounts, but too much cholesterol can cause damage to the arteries and heart. Most people should eat less than 200 milligrams (mg) of cholesterol a day. Foods with cholesterol Cholesterol is found in animal-based foods, such as meat, seafood, and dairy. Generally, low-fat dairy and lean meats have less cholesterol than full-fat dairy and fatty meats. The milligrams of cholesterol per serving (mg per serving) of common cholesterol-containing foods are listed below. Meat and other proteins  Egg -- one large whole egg has 186 mg.  Veal shank -- 4 oz has 141 mg.  Lean ground [place] (93% lean) -- 4 oz has 118 mg.  Fat-trimmed lamb loin -- 4 oz has 106 mg.  Lean ground beef (90% lean) -- 4 oz has 100 mg.  Lobster --  3.5 oz has 90 mg.  Pork loin chops -- 4 oz has 86 mg.  Canned salmon -- 3.5 oz has 83 mg.  Fat-trimmed beef top loin -- 4 oz has 78 mg.  Frankfurter -- 1 frank (3.5 oz) has 77 mg.  Crab -- 3.5 oz has 71 mg.  Roasted chicken without skin, white meat -- 4 oz has 66 mg.  Light bologna -- 2 oz has 45 mg.  Deli-cut [place] -- 2 oz has 31 mg.  Canned tuna -- 3.5 oz has 31 mg.  Berniece Salines -- 1 oz has 29 mg.  Oysters and  mussels (raw) -- 3.5 oz has 25 mg.  Mackerel -- 1 oz has 22 mg.  Trout -- 1 oz has 20 mg.  Pork sausage -- 1 link (1 oz) has 17 mg.  Salmon -- 1 oz has 16 mg.  Tilapia -- 1 oz has 14 mg. Dairy  Soft-serve ice cream --  cup (4 oz) has 103 mg.  Whole-milk yogurt -- 1 cup (8 oz) has 29 mg.  Cheddar cheese -- 1 oz has 28 mg.  American cheese -- 1 oz has 28 mg.  Whole milk -- 1 cup (8 oz) has 23 mg.  2% milk -- 1 cup (8 oz) has 18 mg.  Cream cheese -- 1 tablespoon (Tbsp) has 15 mg.  Cottage cheese --  cup (4 oz) has 14 mg.  Low-fat (1%) milk -- 1 cup (8 oz) has 10 mg.  Sour cream -- 1 Tbsp has 8.5 mg.  Low-fat yogurt -- 1 cup (8 oz) has 8 mg.  Nonfat Greek yogurt -- 1 cup (8 oz) has 7 mg.  Half-and-half cream -- 1 Tbsp has 5 mg. Fats and oils  Cod liver oil -- 1 tablespoon (Tbsp) has 82 mg.  Butter -- 1 Tbsp has 15 mg.  Lard -- 1 Tbsp has 14 mg.  Bacon grease -- 1 Tbsp has 14 mg.  Mayonnaise -- 1 Tbsp has 5-10 mg.  Margarine -- 1 Tbsp has 3-10 mg. Exact amounts of cholesterol in these foods may vary depending on specific ingredients and brands.   Foods without cholesterol Most plant-based foods do not have cholesterol unless you combine them with a food that has cholesterol. Foods without cholesterol include:  Grains and cereals.  Vegetables.  Fruits.  Vegetable oils, such as olive, canola, and sunflower oil.  Legumes, such as peas, beans, and lentils.  Nuts and seeds.  Egg whites.   Summary  The body needs cholesterol in  small amounts, but too much cholesterol can cause damage to the arteries and heart.  Most people should eat less than 200 milligrams (mg) of cholesterol a day. This information is not intended to replace advice given to you by your health care provider. Make sure you discuss any questions you have with your health care provider. Document Revised: 02/18/2020 Document Reviewed: 02/18/2020 Elsevier Patient Education  [place].

## 2021-01-20 NOTE — Progress Notes (Signed)
Chief Complaint  Patient presents with  . Medicare Wellness    Fasting AWV/CPE no pap-sees Carol Harris(I did get pap-they did not send OV) Patient also has DEXA 7/21 @ [facility], I will get that as well. Hasn't had heartburn since she was in her 20's but has recently experienced some. She thinks it might also be from increased stress recently. Needs to call Carol Harris back and scheduled colonoscopy. Has not had Tdap-but will get.   . Immunizations    Patient states that she has a pneumonia vaccine in the late 1990's and she had a reaction to it. She had pneumonia 3 times that year and was told it may have been antibody reaction.     Carol Harris is a 65 y.o. female who presents for annual physical exam, Medicare wellness visit and follow-up on chronic medical conditions.  She previously saw Dr. Redmond Harris, transferring care to me.  Hypertension:  Losartan dose was increased from 50 to 75mg  in 03/2020 at visit with me. She went back to the 50mg  dose after her follow-up visit the next month, as she was having some low blood pressures (and prescription wasn't changed to accommodate the higher dose).  She continues on atenolol as well. BP's have been running 115-125/80-90.    Hyperlipidemia: patient is compliant with taking atorvastatin and following low cholesterol diet. She denies side effects. She eats fish and some poultry, no red meat. Lots of vegetables and vegetable protein. +cheese, butter.  Infrequent eggs.  Lab Results  Component Value Date   CHOL 193 11/13/2019   HDL 70 11/13/2019   LDLCALC 105 (H) 11/13/2019   TRIG 100 11/13/2019   CHOLHDL 2.8 11/13/2019   Impaired fasting glucose--Sugar was elevated at 106 last year.  A1c was normal. She tries to limit sweets and carbs in her diet. Lab Results  Component Value Date   HGBA1C 5.6 12/04/2019   OSA: compliant with CPAP. No compliance report received since 03/2020, she will have them send one.  She reports doing well, feeling  refreshed in the mornings, and no daytime somnolence.  She is continuing to benefit from CPAP use.  Restless legs--relieved by OTC Mg supplement.  Depression and anxiety:  She is taking Wellbutrin, and sees therapist regularly. She was prescribed Buspar, but it made her feel "fuzzy", so not taking it. Stressors include daughter with mental illness, husband has metastatic prostate cancer.  Vitamin D deficiency: Last level was normal at 33.9 in 11/2019. She was advised to double her dose, since level was low-normal. She takes 5000 IU 4x/week, and has been doing so for quite a while (years, didn't double up last year).  H/o CVA. MRI in 12/2013 showed acute L thalamic infarct without hemorrhage.  She takes aspirin and statin.  She denies any neurologic symptoms.  She has a remote history of leukopenia that was evaluated by Carol Harris with no apparent follow-up needed.  Lab Results  Component Value Date   WBC 4.2 11/13/2019   HGB 14.1 11/13/2019   HCT 41.9 11/13/2019   MCV 93 11/13/2019   PLT 255 11/13/2019   Immunization History  Administered Date(s) Administered  . Fluad Quad(high Dose 65+) 07/11/2019  . Influenza,inj,Quad PF,6+ Mos 11/03/2018  . PFIZER Comirnaty(Gray Top)Covid-19 Tri-Sucrose Vaccine 01/21/2021  . PFIZER(Purple Top)SARS-COV-2 Vaccination 11/17/2019, 12/12/2019, 07/13/2020  . Tdap 04/21/2011  . Zoster Recombinat (Shingrix) 11/03/2018, 01/15/2019   Forgot to get flu shot this year. One side of her body swelled up after a pneumonia vaccine in the late  90's.  Last Pap smear: per gynecologist, 02/2019 Last mammogram: 04/2020 Last colonoscopy: 12-03-14 with Carol Harris. 5 yr f/u recommended due to FHx colon CA.  She is aware that she is past due and needs to schedule it. Last DEXA:  Through GYN, last year, doesn't recall getting the results. Dentist: every 6 months Ophtho: yearly (last 07/2020) Exercise:  walks 5 days/week, 30-40 minutes.  Not much weight-bearing  exercise.  Other doctors caring for patient include: GI: [facility] Counselor:  Carol [facility] (not currently) Psych: [facility] (NP) at [facility]. Dentist: Dr. Garry Harris Ophtho: [facility] GYN: Dr. Judd GaudierAllyson Harris in the past  Depression screen:  negative Fall Screen: none Functional Status Survey: unremarkable Mini-Cog: normal See Epic for full questionnaires/screens  End of Life Discussion:  Patient has a living will and medical power of attorney We do not have a copy on file   PMH, [place], [place] and FH were reviewed and updated  Outpatient Encounter Medications as of 01/21/2021  Medication Sig Note  . aspirin EC 81 MG tablet Take 81 mg by mouth daily.   Marland Kitchen atenolol (TENORMIN) 25 MG tablet TAKE 1 TABLET BY MOUTH TWICE A DAY   . atorvastatin (LIPITOR) 40 MG tablet TAKE 1 TABLET BY MOUTH EVERYDAY AT BEDTIME   . buPROPion (WELLBUTRIN XL) 150 MG 24 hr tablet Take 450 mg by mouth every morning. 01/21/2021: Usually takes 2  . Cholecalciferol (VITAMIN D-3) 125 MCG (5000 UT) TABS Take 1 tablet by mouth 4 (four) times a week.   . cyanocobalamin 100 MCG tablet Take 1,000 mcg by mouth daily.   Marland Kitchen losartan (COZAAR) 50 MG tablet TAKE 1 TABLET BY MOUTH EVERY DAY   . Magnesium 250 MG TABS Take 1 tablet by mouth 4 (four) times a week.   . [DISCONTINUED] MAGNESIUM PO Take by mouth.   Marland Kitchen acetaminophen (TYLENOL) 500 MG tablet Take 500 mg by mouth every 6 (six) hours as needed. (Patient not taking: No sig reported)   . DM-APAP-CPM (CORICIDIN HBP PO) Take 1 capsule by mouth as needed. (Patient not taking: Reported on 01/21/2021)   . [DISCONTINUED] busPIRone (BUSPAR) 10 MG tablet Take 5-10 mg by mouth 2 (two) times daily. (Patient not taking: Reported on 03/24/2020) 03/24/2020: Tried it once, made her loopy  . [DISCONTINUED] Cholecalciferol (VITAMIN D3) 250 MCG (10000 UT) TABS Take 1 tablet by mouth daily. 03/24/2020: 5 days a week  . [DISCONTINUED] Olopatadine HCl 0.2 % SOLN Apply 2  drops to eye daily. (Patient not taking: No sig reported)   . [DISCONTINUED] Pseudoephedrine HCl (SUDAFED PO) Take 30 mg by mouth. (Patient not taking: Reported on 03/24/2020)    No facility-administered encounter medications on file as of 01/21/2021.   No Known Allergies   ROS:  Patient denies anorexia, fever, weight changes, headaches, vision changes, decreased hearing, URI symptoms, breast concerns, chest pain, palpitations, dizziness, syncope, dyspnea on exertion, cough, swelling, nausea, vomiting, diarrhea, constipation, abdominal pain, melena, hematochezia, hematuria, incontinence, dysuria, vaginal bleeding, discharge, odor or itch, genital lesions, joint pains, numbness, tingling, weakness, tremor, suspicious skin lesions, depression, anxiety, abnormal bleeding/bruising, or enlarged lymph nodes.  GERD--very rare, had bad spell 2 weeks ago, woke her up at night (possibly related to salad dressing).   Some stress/anxiety related to company/visitors, and also related to her retirement. Anxiety is improved, as is her depression. Constipation resolved with increase in water intake.   PHYSICAL EXAM:  BP 130/80   Pulse 72   Ht 5' 4.75" (1.645  m)   Wt 167 lb 12.8 oz (76.1 kg)   BMI 28.14 kg/m   Wt Readings from Last 3 Encounters:  01/21/21 167 lb 12.8 oz (76.1 kg)  04/29/20 165 lb 12.8 oz (75.2 kg)  03/24/20 166 lb (75.3 kg)    General Appearance: Alert, cooperative, no distress, appears stated age Head: Normocephalic, without obvious abnormality, atraumatic Eyes: PERRL, conjunctiva/corneas clear, EOM's intact, fundi benign Ears: Normal TM's and external ear canals Nose: Nares normal, mucosa normal, no drainage or sinus tenderness Throat: Lips, mucosa, and tongue normal; teeth and gums normal Neck: Supple, no lymphadenopathy;  thyroid:  no enlargement/tenderness/nodules; no carotid bruit or JVD Lungs: Clear to auscultation bilaterally without wheezes, rales or ronchi; respirations  unlabored Heart: Regular rate and rhythm, S1 and S2 normal, no murmur, rubor gallop Abdomen: Soft, non-tender, nondistended, normoactive bowel sounds,  no masses, no hepatosplenomegaly Skin:  Skin color, texture, turgor normal, no rashes or lesions Lymph nodes: Cervical, supraclavicular, and axillary nodes normal Neurologic:  Normal strength, sensation and gait; reflexes 2+ and symmetric throughout Psych: Normal mood, affect, hygiene and grooming. Breast, pelvic and rectal exams were deferred to her GYN.  ASSESSMENT/PLAN:  Annual physical exam - Plan: POCT Urinalysis DIP (Proadvantage Device)  Medicare annual wellness visit, initial  Obstructive sleep apnea - continue CPAP  Impaired fasting glucose - continue low sugar, low carb diet and regular exercise - Plan: Comprehensive metabolic panel, Hemoglobin A1c  Vitamin D deficiency - continue supplements  Hyperlipidemia, mixed - goal LDL <100 given h/o CVA; was slightly above goal last year. Due for recheck. Low cholesterol diet reviewed - Plan: Lipid panel  Family history of colon cancer in mother  Essential hypertension - controlled on current regimen - Plan: Comprehensive metabolic panel  Need for [id number] vaccine - Plan: PFIZER Comirnaty(GRAY TOP)COVID-19 Vaccine  Medication monitoring encounter - Plan: Comprehensive metabolic panel, Lipid panel, CBC with Differential/Platelet  Discussed monthly self breast exams and yearly mammograms; at least 30 minutes of aerobic activity at least 5 days/week and weight-bearing exercise 2x/week; proper sunscreen use reviewed; healthy diet, including goals of calcium and vitamin D intake and alcohol recommendations (less than or equal to 1 drink/day) reviewed; regular seatbelt use; changing batteries in smoke detectors, use of carbon monoxide detectors.  Immunization recommendations discussed--to get TdaP from pharmacy. COVID booster given today. To return for Prevnar-20 in >2 weeks (pt prefers  August).  Encouraged yearly high dose flu shots.  Colonoscopy recommendations reviewed, due now, reminded to schedule with [facility]. Had DEXA last year, will get results.  Need copies of living will and healthcare POA, discussed.   Medicare Attestation I have personally reviewed: The patient's medical and social history Their use of alcohol, tobacco or illicit drugs Their current medications and supplements The patient's functional ability including ADLs,fall risks, home safety risks, cognitive, and hearing and visual impairment Diet and physical activities Evidence for depression or mood disorders  The patient's weight, height, BMI have been recorded in the chart.  I have made referrals, counseling, and provided education to the patient based on review of the above and I have provided the patient with a written personalized care plan for preventive services.     Vikki Ports, MD

## 2021-01-21 ENCOUNTER — Other Ambulatory Visit: Payer: Self-pay

## 2021-01-21 ENCOUNTER — Ambulatory Visit (INDEPENDENT_AMBULATORY_CARE_PROVIDER_SITE_OTHER): Payer: PPO | Admitting: Family Medicine

## 2021-01-21 ENCOUNTER — Encounter: Payer: Self-pay | Admitting: *Deleted

## 2021-01-21 ENCOUNTER — Encounter: Payer: Self-pay | Admitting: Family Medicine

## 2021-01-21 VITALS — BP 130/80 | HR 72 | Ht 64.75 in | Wt 167.8 lb

## 2021-01-21 DIAGNOSIS — G4733 Obstructive sleep apnea (adult) (pediatric): Secondary | ICD-10-CM

## 2021-01-21 DIAGNOSIS — E782 Mixed hyperlipidemia: Secondary | ICD-10-CM | POA: Diagnosis not present

## 2021-01-21 DIAGNOSIS — E559 Vitamin D deficiency, unspecified: Secondary | ICD-10-CM

## 2021-01-21 DIAGNOSIS — Z5181 Encounter for therapeutic drug level monitoring: Secondary | ICD-10-CM | POA: Diagnosis not present

## 2021-01-21 DIAGNOSIS — Z800 Family history of malignant neoplasm of digestive organs: Secondary | ICD-10-CM | POA: Diagnosis not present

## 2021-01-21 DIAGNOSIS — Z0000 Encounter for general adult medical examination without abnormal findings: Secondary | ICD-10-CM | POA: Diagnosis not present

## 2021-01-21 DIAGNOSIS — R7301 Impaired fasting glucose: Secondary | ICD-10-CM

## 2021-01-21 DIAGNOSIS — I10 Essential (primary) hypertension: Secondary | ICD-10-CM | POA: Diagnosis not present

## 2021-01-21 DIAGNOSIS — Z23 Encounter for immunization: Secondary | ICD-10-CM | POA: Diagnosis not present

## 2021-01-21 LAB — POCT URINALYSIS DIP (PROADVANTAGE DEVICE)
Bilirubin, UA: NEGATIVE
Blood, UA: NEGATIVE
Glucose, UA: NEGATIVE mg/dL
Ketones, POC UA: NEGATIVE mg/dL
Leukocytes, UA: NEGATIVE
Nitrite, UA: NEGATIVE
Protein Ur, POC: NEGATIVE mg/dL
Specific Gravity, Urine: 1.015
Urobilinogen, Ur: NEGATIVE
pH, UA: 6 (ref 5.0–8.0)

## 2021-01-22 LAB — COMPREHENSIVE METABOLIC PANEL
ALT: 19 IU/L (ref 0–32)
AST: 19 IU/L (ref 0–40)
Albumin/Globulin Ratio: 1.6 (ref 1.2–2.2)
Albumin: 4.6 g/dL (ref 3.8–4.8)
Alkaline Phosphatase: 76 IU/L (ref 44–121)
BUN/Creatinine Ratio: 17 (ref 12–28)
BUN: 13 mg/dL (ref 8–27)
Bilirubin Total: 0.4 mg/dL (ref 0.0–1.2)
CO2: 21 mmol/L (ref 20–29)
Calcium: 9.6 mg/dL (ref 8.7–10.3)
Chloride: 95 mmol/L — ABNORMAL LOW (ref 96–106)
Creatinine, Ser: 0.77 mg/dL (ref 0.57–1.00)
Globulin, Total: 2.8 g/dL (ref 1.5–4.5)
Glucose: 92 mg/dL (ref 65–99)
Potassium: 4.1 mmol/L (ref 3.5–5.2)
Sodium: 132 mmol/L — ABNORMAL LOW (ref 134–144)
Total Protein: 7.4 g/dL (ref 6.0–8.5)
eGFR: 85 mL/min/{1.73_m2} (ref >59)

## 2021-01-22 LAB — LIPID PANEL
Chol/HDL Ratio: 2.8 ratio (ref 0.0–4.4)
Cholesterol, Total: 177 mg/dL (ref 100–199)
HDL: 63 mg/dL (ref >39)
LDL Chol Calc (NIH): 97 mg/dL (ref 0–99)
Triglycerides: 96 mg/dL (ref 0–149)
VLDL Cholesterol Cal: 17 mg/dL (ref 5–40)

## 2021-01-22 LAB — CBC WITH DIFFERENTIAL/PLATELET
Basophils Absolute: 0 10*3/uL (ref 0.0–0.2)
Basos: 1 %
EOS (ABSOLUTE): 0 10*3/uL (ref 0.0–0.4)
Eos: 1 %
Hematocrit: 40.4 % (ref 34.0–46.6)
Hemoglobin: 13.9 g/dL (ref 11.1–15.9)
Immature Grans (Abs): 0 10*3/uL (ref 0.0–0.1)
Immature Granulocytes: 0 %
Lymphocytes Absolute: 1.7 10*3/uL (ref 0.7–3.1)
Lymphs: 36 %
MCH: 31.5 pg (ref 26.6–33.0)
MCHC: 34.4 g/dL (ref 31.5–35.7)
MCV: 92 fL (ref 79–97)
Monocytes Absolute: 0.5 10*3/uL (ref 0.1–0.9)
Monocytes: 10 %
Neutrophils Absolute: 2.5 10*3/uL (ref 1.4–7.0)
Neutrophils: 52 %
Platelets: 230 10*3/uL (ref 150–450)
RBC: 4.41 x10E6/uL (ref 3.77–5.28)
RDW: 12.7 % (ref 11.7–15.4)
WBC: 4.7 10*3/uL (ref 3.4–10.8)

## 2021-01-22 LAB — HEMOGLOBIN A1C
Est. average glucose Bld gHb Est-mCnc: 117 mg/dL
Hgb A1c MFr Bld: 5.7 % — ABNORMAL HIGH (ref 4.8–5.6)

## 2021-01-23 ENCOUNTER — Encounter: Payer: Self-pay | Admitting: Family Medicine

## 2021-01-27 ENCOUNTER — Other Ambulatory Visit: Payer: Self-pay | Admitting: Family Medicine

## 2021-01-27 ENCOUNTER — Encounter: Payer: Self-pay | Admitting: Family Medicine

## 2021-01-27 DIAGNOSIS — E782 Mixed hyperlipidemia: Secondary | ICD-10-CM

## 2021-01-29 ENCOUNTER — Encounter: Payer: Self-pay | Admitting: Family Medicine

## 2021-02-04 ENCOUNTER — Other Ambulatory Visit: Payer: Self-pay | Admitting: Family Medicine

## 2021-02-04 DIAGNOSIS — I10 Essential (primary) hypertension: Secondary | ICD-10-CM

## 2021-02-04 DIAGNOSIS — E782 Mixed hyperlipidemia: Secondary | ICD-10-CM

## 2021-02-18 ENCOUNTER — Encounter: Payer: Self-pay | Admitting: Family Medicine

## 2021-02-18 ENCOUNTER — Other Ambulatory Visit: Payer: Self-pay

## 2021-02-18 ENCOUNTER — Ambulatory Visit (INDEPENDENT_AMBULATORY_CARE_PROVIDER_SITE_OTHER): Payer: PPO | Admitting: Family Medicine

## 2021-02-18 VITALS — BP 122/84 | HR 60 | Temp 98.4°F | Ht 64.75 in | Wt 167.6 lb

## 2021-02-18 DIAGNOSIS — R3915 Urgency of urination: Secondary | ICD-10-CM

## 2021-02-18 DIAGNOSIS — R32 Unspecified urinary incontinence: Secondary | ICD-10-CM

## 2021-02-18 LAB — POCT URINALYSIS DIP (PROADVANTAGE DEVICE)
Bilirubin, UA: NEGATIVE
Blood, UA: NEGATIVE
Glucose, UA: NEGATIVE mg/dL
Ketones, POC UA: NEGATIVE mg/dL
Leukocytes, UA: NEGATIVE
Nitrite, UA: NEGATIVE
Protein Ur, POC: NEGATIVE mg/dL
Specific Gravity, Urine: 1.01
Urobilinogen, Ur: NEGATIVE
pH, UA: 6.5 (ref 5.0–8.0)

## 2021-02-18 NOTE — Progress Notes (Signed)
Chief Complaint  Patient presents with  . Urinary Incontinence    Patient states she has wet the bed the bed the past two nights-she did drink a lot of water though. Didn't drink as much water last night and still wet the bed. Has been having some urgency-thought it was because her bladder was too full. No burning or odor.     2 nights ago she wokeup and had wet the bed.  She had been drinking more water in the evenings than normal. It occurred again last night, smaller amount.  Nothing to drink late in the evening.  No dysuria, hematuria, odor to urine. Previously only rare incontinence--with cough, sneeze or if bladder is very full.  In the last 6-8 months, she feels like she doesn't empty bladder quite as well, needs to press down to fully empty.  Caffeine--2 cappuccino's/d. Rare soda/tea. No vaginal discharge, odor or itch. Constipation improved since drinking 8-10 glasses of water daily, over the last few months. Denies any prolapse or other vaginal complaints.   PMH, PSH. SH reviewed  Outpatient Encounter Medications as of 02/18/2021  Medication Sig Note  . aspirin EC 81 MG tablet Take 81 mg by mouth daily.   Marland Kitchen atenolol (TENORMIN) 25 MG tablet TAKE 1 TABLET BY MOUTH TWICE A DAY   . atorvastatin (LIPITOR) 40 MG tablet TAKE 1 TABLET BY MOUTH EVERYDAY AT BEDTIME   . buPROPion (WELLBUTRIN XL) 150 MG 24 hr tablet Take 450 mg by mouth every morning. 01/21/2021: Usually takes 2  . Cholecalciferol (VITAMIN D-3) 125 MCG (5000 UT) TABS Take 1 tablet by mouth 4 (four) times a week.   . cyanocobalamin 100 MCG tablet Take 1,000 mcg by mouth daily.   Marland Kitchen losartan (COZAAR) 50 MG tablet TAKE 1 TABLET BY MOUTH EVERY DAY   . Magnesium 250 MG TABS Take 1 tablet by mouth 4 (four) times a week.   Marland Kitchen acetaminophen (TYLENOL) 500 MG tablet Take 500 mg by mouth every 6 (six) hours as needed. (Patient not taking: No sig reported)   . DM-APAP-CPM (CORICIDIN HBP PO) Take 1 capsule by mouth as needed. (Patient  not taking: No sig reported)    No facility-administered encounter medications on file as of 02/18/2021.   No Known Allergies  ROS:  No fever, chills, abdominal pain. No URI symptoms, chest pain, shortness of breath. Bowels are regular, no vaginal discharge, odor, itch. Occasional back pain (after heavy lifting).   PHYSICAL EXAM:  BP 122/84   Pulse 60   Temp 98.4 F (36.9 C) (Tympanic)   Ht 5' 4.75" (1.645 m)   Wt 167 lb 9.6 oz (76 kg)   BMI 28.11 kg/m   Well-appearing, pleasant female, in no distress Neck: no lymphadenopathy or mass Heart: regular rate and rhythm Lungs: clear bilaterally Back: no CVA tenderness Abdomen: soft, nontender, no mass  Urinalysis: SG 1.010, negative leuks, nitrite, protein, blood.   ASSESSMENT/PLAN:  Urinary incontinence, unspecified type - Ddx of incontinence reviewed. u/a normal, send for culture.  Limit fluids. Kegels.  f/u if symptoms persist/worsen - Plan: POCT Urinalysis DIP (Proadvantage Device), Urine Culture  Urinary urgency - Plan: POCT Urinalysis DIP (Proadvantage Device)

## 2021-02-18 NOTE — Patient Instructions (Signed)
Limit fluids after 6-7 pm. Try and do Kegel's exercises regularly. Try and be sure to empty the bladder completely (leaning forward with suprapubic pressure).  Contact us if you noticed blood in the urine, odor or cloudy urine, fever, lower abdominal or flank pain.  We will contact you if the culture shows any infection.   Urinary Incontinence  Urinary incontinence refers to a condition in which a person is unable to control where and when to pass urine. A person with this condition will urinate when he or she does not mean to (involuntarily). What are the causes? This condition may be caused by:  Medicines.  Infections.  Constipation.  Overactive bladder muscles.  Weak bladder muscles.  Weak pelvic floor muscles. These muscles provide support for the bladder, intestine, and, in women, the uterus.  Enlarged prostate in men. The prostate is a gland near the bladder. When it gets too big, it can pinch the urethra. With the urethra blocked, the bladder can weaken and lose the ability to empty properly.  Surgery.  Emotional factors, such as anxiety, stress, or post-traumatic stress disorder (PTSD).  Pelvic organ prolapse. This happens in women when organs shift out of place and into the vagina. This shift can prevent the bladder and urethra from working properly. What increases the risk? The following factors may make you more likely to develop this condition:  Older age.  Obesity and physical inactivity.  Pregnancy and childbirth.  Menopause.  Diseases that affect the nerves or spinal cord (neurological diseases).  Long-term (chronic) coughing. This can increase pressure on the bladder and pelvic floor muscles. What are the signs or symptoms? Symptoms may vary depending on the type of urinary incontinence you have. They include:  A sudden urge to urinate, but passing urine involuntarily before you can get to a bathroom (urge incontinence).  Suddenly passing urine  with any activity that forces urine to pass, such as coughing, laughing, exercise, or sneezing (stress incontinence).  Needing to urinate often, but urinating only a small amount, or constantly dribbling urine (overflow incontinence).  Urinating because you cannot get to the bathroom in time due to a physical disability, such as arthritis or injury, or communication and thinking problems, such as Alzheimer disease (functional incontinence). How is this diagnosed? This condition may be diagnosed based on:  Your medical history.  A physical exam.  Tests, such as: ? Urine tests. ? X-rays of your kidney and bladder. ? Ultrasound. ? CT scan. ? Cystoscopy. In this procedure, a health care provider inserts a tube with a light and camera (cystoscope) through the urethra and into the bladder in order to check for problems. ? Urodynamic testing. These tests assess how well the bladder, urethra, and sphincter can store and release urine. There are different types of urodynamic tests, and they vary depending on what the test is measuring. To help diagnose your condition, your health care provider may recommend that you keep a log of when you urinate and how much you urinate. How is this treated? Treatment for this condition depends on the type of incontinence that you have and its cause. Treatment may include:  Lifestyle changes, such as: ? Quitting smoking. ? Maintaining a healthy weight. ? Staying active. Try to get 150 minutes of moderate-intensity exercise every week. Ask your health care provider which activities are safe for you. ? Eating a healthy diet.  Avoid high-fat foods, like fried foods.  Avoid refined carbohydrates like white bread and white rice.  Limit how  much alcohol and caffeine you drink.  Increase your fiber intake. Foods such as fresh fruits, vegetables, beans, and whole grains are healthy sources of fiber.  Pelvic floor muscle exercises.  Bladder training, such as  lengthening the amount of time between bathroom breaks, or using the bathroom at regular intervals.  Using techniques to suppress bladder urges. This can include distraction techniques or controlled breathing exercises.  Medicines to relax the bladder muscles and prevent bladder spasms.  Medicines to help slow or prevent the growth of a man's prostate.  Botox injections. These can help relax the bladder muscles.  Using pulses of electricity to help change bladder reflexes (electrical nerve stimulation).  For women, using a medical device to prevent urine leaks. This is a small, tampon-like, disposable device that is inserted into the urethra.  Injecting collagen or carbon beads (bulking agents) into the urinary sphincter. These can help thicken tissue and close the bladder opening.  Surgery. Follow these instructions at home: Lifestyle  Limit alcohol and caffeine. These can fill your bladder quickly and irritate it.  Keep yourself clean to help prevent odors and skin damage. Ask your doctor about special skin creams and cleansers that can protect the skin from urine.  Consider wearing pads or adult diapers. Make sure to change them regularly, and always change them right after experiencing incontinence. General instructions  Take over-the-counter and prescription medicines only as told by your health care provider.  Use the bathroom about every 3-4 hours, even if you do not feel the need to urinate. Try to empty your bladder completely every time. After urinating, wait a minute. Then try to urinate again.  Make sure you are in a relaxed position while urinating.  If your incontinence is caused by nerve problems, keep a log of the medicines you take and the times you go to the bathroom.  Keep all follow-up visits as told by your health care provider. This is important. Contact a health care provider if:  You have pain that gets worse.  Your incontinence gets worse. Get help  right away if:  You have a fever or chills.  You are unable to urinate.  You have redness in your groin area or down your legs. Summary  Urinary incontinence refers to a condition in which a person is unable to control where and when to pass urine.  This condition may be caused by medicines, infection, weak bladder muscles, weak pelvic floor muscles, enlargement of the prostate (in men), or surgery.  The following factors increase your risk for developing this condition: older age, obesity, pregnancy and childbirth, menopause, neurological diseases, and chronic coughing.  There are several types of urinary incontinence. They include urge incontinence, stress incontinence, overflow incontinence, and functional incontinence.  This condition is usually treated first with lifestyle and behavioral changes, such as quitting smoking, eating a healthier diet, and doing regular pelvic floor exercises. Other treatment options include medicines, bulking agents, medical devices, electrical nerve stimulation, or surgery. This information is not intended to replace advice given to you by your health care provider. Make sure you discuss any questions you have with your health care provider. Document Revised: 10/07/2017 Document Reviewed: 01/06/2017 Elsevier Patient Education  [place].   Kegel Exercises  Kegel exercises can help strengthen your pelvic floor muscles. The pelvic floor is a group of muscles that support your rectum, small intestine, and bladder. In females, pelvic floor muscles also help support the womb (uterus). These muscles help you control the  flow of urine and stool. Kegel exercises are painless and simple, and they do not require any equipment. Your provider may suggest Kegel exercises to:  Improve bladder and bowel control.  Improve sexual response.  Improve weak pelvic floor muscles after surgery to remove the uterus (hysterectomy) or pregnancy (females).  Improve  weak pelvic floor muscles after prostate gland removal or surgery (males). Kegel exercises involve squeezing your pelvic floor muscles, which are the same muscles you squeeze when you try to stop the flow of urine or keep from passing gas. The exercises can be done while sitting, standing, or lying down, but it is best to vary your position. Exercises How to do Kegel exercises: 1. Squeeze your pelvic floor muscles tight. You should feel a tight lift in your rectal area. If you are a female, you should also feel a tightness in your vaginal area. Keep your stomach, buttocks, and legs relaxed. 2. Hold the muscles tight for up to 10 seconds. 3. Breathe normally. 4. Relax your muscles. 5. Repeat as told by your health care provider. Repeat this exercise daily as told by your health care provider. Continue to do this exercise for at least 4-6 weeks, or for as long as told by your health care provider. You may be referred to a physical therapist who can help you learn more about how to do Kegel exercises. Depending on your condition, your health care provider may recommend:  Varying how long you squeeze your muscles.  Doing several sets of exercises every day.  Doing exercises for several weeks.  Making Kegel exercises a part of your regular exercise routine. This information is not intended to replace advice given to you by your health care provider. Make sure you discuss any questions you have with your health care provider. Document Revised: 02/01/2020 Document Reviewed: 05/17/2018 Elsevier Patient Education  [place].

## 2021-02-21 LAB — URINE CULTURE: Organism ID, Bacteria: NO GROWTH

## 2021-03-26 DIAGNOSIS — G4733 Obstructive sleep apnea (adult) (pediatric): Secondary | ICD-10-CM | POA: Diagnosis not present

## 2021-05-13 DIAGNOSIS — Z1231 Encounter for screening mammogram for malignant neoplasm of breast: Secondary | ICD-10-CM | POA: Diagnosis not present

## 2021-05-13 LAB — HM MAMMOGRAPHY

## 2021-05-14 DIAGNOSIS — F900 Attention-deficit hyperactivity disorder, predominantly inattentive type: Secondary | ICD-10-CM | POA: Diagnosis not present

## 2021-05-14 DIAGNOSIS — F332 Major depressive disorder, recurrent severe without psychotic features: Secondary | ICD-10-CM | POA: Diagnosis not present

## 2021-05-14 DIAGNOSIS — F411 Generalized anxiety disorder: Secondary | ICD-10-CM | POA: Diagnosis not present

## 2021-05-18 ENCOUNTER — Other Ambulatory Visit: Payer: Self-pay | Admitting: Family Medicine

## 2021-05-18 DIAGNOSIS — E782 Mixed hyperlipidemia: Secondary | ICD-10-CM

## 2021-05-20 ENCOUNTER — Other Ambulatory Visit (INDEPENDENT_AMBULATORY_CARE_PROVIDER_SITE_OTHER): Payer: PPO

## 2021-05-20 ENCOUNTER — Other Ambulatory Visit: Payer: Self-pay

## 2021-05-20 ENCOUNTER — Other Ambulatory Visit: Payer: PPO

## 2021-05-20 DIAGNOSIS — Z23 Encounter for immunization: Secondary | ICD-10-CM | POA: Diagnosis not present

## 2021-06-19 DIAGNOSIS — Z01419 Encounter for gynecological examination (general) (routine) without abnormal findings: Secondary | ICD-10-CM | POA: Diagnosis not present

## 2021-06-19 DIAGNOSIS — Z6827 Body mass index (BMI) 27.0-27.9, adult: Secondary | ICD-10-CM | POA: Diagnosis not present

## 2021-06-19 DIAGNOSIS — Z124 Encounter for screening for malignant neoplasm of cervix: Secondary | ICD-10-CM | POA: Diagnosis not present

## 2021-06-19 DIAGNOSIS — Z01411 Encounter for gynecological examination (general) (routine) with abnormal findings: Secondary | ICD-10-CM | POA: Diagnosis not present

## 2021-07-15 ENCOUNTER — Encounter: Payer: Self-pay | Admitting: Family Medicine

## 2021-07-15 ENCOUNTER — Other Ambulatory Visit: Payer: Self-pay

## 2021-07-15 ENCOUNTER — Telehealth (INDEPENDENT_AMBULATORY_CARE_PROVIDER_SITE_OTHER): Payer: PPO | Admitting: Family Medicine

## 2021-07-15 VITALS — Temp 97.2°F | Wt 160.0 lb

## 2021-07-15 DIAGNOSIS — U071 COVID-19: Secondary | ICD-10-CM

## 2021-07-15 NOTE — Progress Notes (Signed)
   Subjective:    Patient ID: Carol Harris, female    DOB: 1955/12/11, 65 y.o.   MRN: [id number]  HPI Documentation for virtual audio and video telecommunications through [place] encounter: The patient was located at home. 2 patient identifiers used.  The provider was located in the office. The patient did consent to this visit and is aware of possible charges through their insurance for this visit. The other persons participating in this telemedicine service were none. Time spent on call was 5 minutes and in review of previous records >15 minutes total for counseling and coordination of care. This virtual service is not related to other E/M service within previous 7 days.  She states that 1 week ago she developed a slight fever, sinus congestion on the right cheek pain.  The symptoms did not change however on Sunday she did test positive for COVID.  Since then she is continue to have difficulty with sinus congestion but no fever, chills, cough, shortness of breath.  Review of Systems     Objective:   Physical Exam  Alert and in no distress with normal breathing pattern.      Assessment & Plan:  COVID-19 I explained that at this time there is no need for any intervention.  Explained the 5-day timeframe to stay sequestered in another 5 days of wearing a mask.  She was comfortable with this.

## 2021-08-04 ENCOUNTER — Other Ambulatory Visit: Payer: Self-pay | Admitting: Family Medicine

## 2021-08-04 DIAGNOSIS — I10 Essential (primary) hypertension: Secondary | ICD-10-CM

## 2021-08-13 ENCOUNTER — Other Ambulatory Visit: Payer: Self-pay | Admitting: Family Medicine

## 2021-08-13 ENCOUNTER — Other Ambulatory Visit: Payer: Self-pay

## 2021-08-13 ENCOUNTER — Other Ambulatory Visit (INDEPENDENT_AMBULATORY_CARE_PROVIDER_SITE_OTHER): Payer: PPO

## 2021-08-13 DIAGNOSIS — Z23 Encounter for immunization: Secondary | ICD-10-CM | POA: Diagnosis not present

## 2021-08-13 DIAGNOSIS — I10 Essential (primary) hypertension: Secondary | ICD-10-CM

## 2021-08-27 DIAGNOSIS — Z800 Family history of malignant neoplasm of digestive organs: Secondary | ICD-10-CM | POA: Diagnosis not present

## 2021-08-27 DIAGNOSIS — Z1211 Encounter for screening for malignant neoplasm of colon: Secondary | ICD-10-CM | POA: Diagnosis not present

## 2021-08-27 DIAGNOSIS — K648 Other hemorrhoids: Secondary | ICD-10-CM | POA: Diagnosis not present

## 2021-08-27 LAB — HM COLONOSCOPY

## 2021-09-17 DIAGNOSIS — F411 Generalized anxiety disorder: Secondary | ICD-10-CM | POA: Diagnosis not present

## 2021-09-17 DIAGNOSIS — F332 Major depressive disorder, recurrent severe without psychotic features: Secondary | ICD-10-CM | POA: Diagnosis not present

## 2021-09-17 DIAGNOSIS — F900 Attention-deficit hyperactivity disorder, predominantly inattentive type: Secondary | ICD-10-CM | POA: Diagnosis not present

## 2021-09-25 DIAGNOSIS — G4733 Obstructive sleep apnea (adult) (pediatric): Secondary | ICD-10-CM | POA: Diagnosis not present

## 2021-10-14 ENCOUNTER — Other Ambulatory Visit: Payer: Self-pay

## 2021-10-14 ENCOUNTER — Ambulatory Visit (INDEPENDENT_AMBULATORY_CARE_PROVIDER_SITE_OTHER): Payer: PPO

## 2021-10-14 DIAGNOSIS — Z23 Encounter for immunization: Secondary | ICD-10-CM

## 2021-11-05 DIAGNOSIS — F900 Attention-deficit hyperactivity disorder, predominantly inattentive type: Secondary | ICD-10-CM | POA: Diagnosis not present

## 2021-11-05 DIAGNOSIS — F411 Generalized anxiety disorder: Secondary | ICD-10-CM | POA: Diagnosis not present

## 2021-11-05 DIAGNOSIS — F332 Major depressive disorder, recurrent severe without psychotic features: Secondary | ICD-10-CM | POA: Diagnosis not present

## 2021-12-04 ENCOUNTER — Other Ambulatory Visit: Payer: Self-pay | Admitting: Family Medicine

## 2021-12-04 DIAGNOSIS — E782 Mixed hyperlipidemia: Secondary | ICD-10-CM

## 2022-01-20 ENCOUNTER — Other Ambulatory Visit: Payer: Self-pay | Admitting: Family Medicine

## 2022-01-20 DIAGNOSIS — I10 Essential (primary) hypertension: Secondary | ICD-10-CM

## 2022-01-20 NOTE — Telephone Encounter (Signed)
Pt has an appt in August. I will send in 6 months to get her to here appt ?

## 2022-02-03 ENCOUNTER — Ambulatory Visit: Payer: PPO | Admitting: Family Medicine

## 2022-03-26 DIAGNOSIS — G4733 Obstructive sleep apnea (adult) (pediatric): Secondary | ICD-10-CM | POA: Diagnosis not present

## 2022-05-11 DIAGNOSIS — F411 Generalized anxiety disorder: Secondary | ICD-10-CM | POA: Diagnosis not present

## 2022-05-11 DIAGNOSIS — F332 Major depressive disorder, recurrent severe without psychotic features: Secondary | ICD-10-CM | POA: Diagnosis not present

## 2022-05-11 DIAGNOSIS — F900 Attention-deficit hyperactivity disorder, predominantly inattentive type: Secondary | ICD-10-CM | POA: Diagnosis not present

## 2022-05-19 DIAGNOSIS — Z1231 Encounter for screening mammogram for malignant neoplasm of breast: Secondary | ICD-10-CM | POA: Diagnosis not present

## 2022-05-19 NOTE — Patient Instructions (Incomplete)
  HEALTH MAINTENANCE RECOMMENDATIONS:  It is recommended that you get at least 30 minutes of aerobic exercise at least 5 days/week (for weight loss, you may need as much as 60-90 minutes). This can be any activity that gets your heart rate up. This can be divided in 10-15 minute intervals if needed, but try and build up your endurance at least once a week.  Weight bearing exercise is also recommended twice weekly.  Eat a healthy diet with lots of vegetables, fruits and fiber.  "Colorful" foods have a lot of vitamins (ie green vegetables, tomatoes, red peppers, etc).  Limit sweet tea, regular sodas and alcoholic beverages, all of which has a lot of calories and sugar.  Up to 1 alcoholic drink daily may be beneficial for women (unless trying to lose weight, watch sugars).  Drink a lot of water.  Calcium recommendations are 1200-1500 mg daily (1500 mg for postmenopausal women or women without ovaries), and vitamin D 1000 IU daily.  This should be obtained from diet and/or supplements (vitamins), and calcium should not be taken all at once, but in divided doses.  Monthly self breast exams and yearly mammograms for women over the age of 12 is recommended.  Sunscreen of at least SPF 30 should be used on all sun-exposed parts of the skin when outside between the hours of 10 am and 4 pm (not just when at beach or pool, but even with exercise, golf, tennis, and yard work!)  Use a sunscreen that says "broad spectrum" so it covers both UVA and UVB rays, and make sure to reapply every 1-2 hours.  Remember to change the batteries in your smoke detectors when changing your clock times in the spring and fall. Carbon monoxide detectors are recommended for your home.  Use your seat belt every time you are in a car, and please drive safely and not be distracted with cell phones and texting while driving.   Ms. Shore , Thank you for taking time to come for your Medicare Wellness Visit. I appreciate your ongoing  commitment to your health goals. Please review the following plan we discussed and let me know if I can assist you in the future.   This is a list of the screening recommended for you and due dates:  Health Maintenance  Topic Date Due   Tetanus Vaccine  04/20/2021   COVID-19 Vaccine (6 - Pfizer series) 02/11/2022   Mammogram  05/13/2022   Flu Shot  05/11/2022   Colon Cancer Screening  08/28/2031   Pneumonia Vaccine  Completed   DEXA scan (bone density measurement)  Completed   Hepatitis C Screening: USPSTF Recommendation to screen - Ages 89-79 yo.  Completed   Zoster (Shingles) Vaccine  Completed   HPV Vaccine  Aged Out   Please get a tetanus booster (TdaP) from the pharmacy.  Continue yearly high-dose flu shots in the Fall (sept/oct) I recommend getting the updated bivalent COVID booster when available in the Fall. Consider getting the new RSV vaccine when available.  You may use Miralax as needed for constipation (and may use it more regularly if that is helpful, 1/2 to 1 capful every other day to daily, if needed. Titrate the amount to stool frequency (cut back if going too often or if the stool is too loose).  Please bring [place] copies of your Living Will and [place] once completed and notarized so that it can be scanned into your medical chart.

## 2022-05-19 NOTE — Progress Notes (Signed)
Chief Complaint  Patient presents with   Annual Exam    Fasting AWV. Pt has not had Tdap vaccine. Pt had mammogram yesterday at [facility].    Carol Harris is a 66 y.o. female who presents for annual physical exam, Medicare wellness visit and follow-up on chronic medical conditions.    She reports having an essential tremor on the left, which has been a little worse since under more stress.  Has had this for several years, only worse with stress. She doesn't have any problems with her ADL's.  Hypertension:  She is compliant with taking Losartan '50mg'$  daily and atenolol '25mg'$  BID. BP's have been running 115-130/70's-80.  Max of 140/80's, and has a headache when it is that high (only if very stressed, infrequently, twice in 6 months). She denies headaches, dizziness, chest pain, palpitations, edema or side effects to medications.    Hyperlipidemia: patient is compliant with taking atorvastatin and following low cholesterol diet. She denies side effects. She eats fish and some poultry, no red meat. Lots of vegetables and vegetable protein. +cheese, butter. 3 eggs/week  Lab Results  Component Value Date   CHOL 177 01/21/2021   HDL 63 01/21/2021   LDLCALC 97 01/21/2021   TRIG 96 01/21/2021   CHOLHDL 2.8 01/21/2021   Impaired fasting glucose--Fasting sugar was 92 last year, with A1c of 5.7.  Glucose had been elevated at 106 the year before. She tries to limit sweets and carbs in her diet.  OSA: compliant with CPAP. She reports doing well, feeling refreshed in the mornings, and no daytime somnolence.  She is continuing to benefit from CPAP use.  Restless legs--relieved by OTC Mg supplement.  Depression and anxiety:  She is taking Wellbutrin, and sees therapist regularly. She has been on '450mg'$  for the last month, increased from '300mg'$ .  She has had increased stressors.  Stressors include daughter with mental illness, husband has metastatic prostate cancer. His cancer metastasized to the  bone, is in his spine. Planning for more radiation.  Daughter quit her job; saw new psych, started new med, and is doing a little better now.  Vitamin D deficiency: Last level was normal at 33.9 in 11/2019. She takes 5000 IU 4x/week, and has been doing so for quite a while.  H/o CVA. MRI in 12/2013 showed acute L thalamic infarct without hemorrhage.  She takes aspirin and statin.  She denies any neurologic symptoms. Just the tremor as reported above, stable.  She has a remote history of leukopenia that was evaluated by Dr. Beryle Beams with no apparent follow-up needed.  Lab Results  Component Value Date   WBC 4.7 01/21/2021   HGB 13.9 01/21/2021   HCT 40.4 01/21/2021   MCV 92 01/21/2021   PLT 230 01/21/2021    Immunization History  Administered Date(s) Administered   DTaP 06/11/2001   Fluad Quad(high Dose 65+) 07/11/2019, 08/13/2021   Influenza,inj,Quad PF,6+ Mos 11/03/2018   PFIZER Comirnaty(Gray Top)Covid-19 Tri-Sucrose Vaccine 01/21/2021   PFIZER(Purple Top)SARS-COV-2 Vaccination 11/17/2019, 12/12/2019, 07/13/2020   PNEUMOCOCCAL CONJUGATE-20 05/20/2021   Pfizer Covid-19 Vaccine Bivalent Booster 60yr & up 10/14/2021   Tdap 04/21/2011   Zoster Recombinat (Shingrix) 11/03/2018, 01/15/2019   One side of her body swelled up after a pneumonia vaccine in the late 90's. Last Pap smear: per gynecologist, 02/2019 Last mammogram: 05/2021; had one yesterday Last colonoscopy: 08/2021 normal, internal hemorrhoids, Dr. MEarlean Shawl had been getting q5 years due to FHx Last DEXA:  07/2020 T -1.6 at R fem neck  Dentist: every  6 months Ophtho: yearly Exercise:  walks 1-2 miles daily, about 30-40 minutes.  Does push-ups. Does all the lifting at home (due to husband's spinal mets).   Patient Care Team: Rita Ohara, MD as PCP - General (Family Medicine) GI: Dr. Earlean Shawl (retired) Counselor:  Melvenia Beam (not currently) Psych: Shirlean Mylar (NP) at [facility] Dentist: Dr. Garry Heater Ophtho:  Dr. Marica Otter GYN: Dr. Judd GaudierAllyson Sabal in the past  Depression Screening: [place] Office Visit from 05/20/2022 in [place]  PHQ-2 Total Score 0       Falls screen:     05/20/2022    1:32 PM 01/21/2021    2:29 PM 04/29/2020   11:07 AM 09/05/2019    9:47 AM  [place] in the past year? 0 0 0 0  Comment    Emmi Telephone Survey: data to providers prior to load  Number falls in past yr: 0 0    Injury with Fall? 0 0    Risk for fall due to : No Fall Risks No Fall Risks    Follow up Falls evaluation completed Falls evaluation completed       Functional Status Survey: Is the patient deaf or have difficulty hearing?: No Does the patient have difficulty seeing, even when wearing glasses/contacts?: No Does the patient have difficulty concentrating, remembering, or making decisions?: No Does the patient have difficulty walking or climbing stairs?: No Does the patient have difficulty dressing or bathing?: No Does the patient have difficulty doing errands alone such as visiting a doctor's office or shopping?: No  Mini-Cog Scoring: 5   End of Life Discussion:  Patient has a living will and medical power of attorney We do not have a copy on file   PMH, PSH, [place] and FH were reviewed and updated  Outpatient Encounter Medications as of 05/20/2022  Medication Sig Note   aspirin EC 81 MG tablet Take 81 mg by mouth daily.    atenolol (TENORMIN) 25 MG tablet TAKE 1 TABLET BY MOUTH TWICE A DAY    atorvastatin (LIPITOR) 40 MG tablet TAKE 1 TABLET BY MOUTH EVERYDAY AT BEDTIME    buPROPion (WELLBUTRIN XL) 150 MG 24 hr tablet Take 450 mg by mouth every morning. 05/20/2022: Pt takes 2-3 daily   Cholecalciferol (VITAMIN D-3) 125 MCG (5000 UT) TABS Take 1 tablet by mouth 4 (four) times a week.    cyanocobalamin 100 MCG tablet Take 1,000 mcg by mouth daily.    losartan (COZAAR) 50 MG tablet TAKE 1 TABLET BY MOUTH EVERY DAY    VITAMIN B COMPLEX-C PO      acetaminophen (TYLENOL) 500 MG tablet Take 500 mg by mouth every 6 (six) hours as needed. (Patient not taking: Reported on 03/24/2020)    [DISCONTINUED] DM-APAP-CPM (CORICIDIN HBP PO) Take 1 capsule by mouth as needed.    [DISCONTINUED] Magnesium 250 MG TABS Take 1 tablet by mouth 4 (four) times a week.    No facility-administered encounter medications on file as of 05/20/2022.   No Known Allergies   ROS:  Patient denies anorexia, fever, weight changes, headaches, vision changes, decreased hearing, URI symptoms, breast concerns, chest pain, palpitations, dizziness, syncope, dyspnea on exertion, cough, swelling, nausea, vomiting, diarrhea, constipation, abdominal pain, melena, hematochezia, hematuria, incontinence, dysuria, vaginal bleeding, discharge, odor or itch, genital lesions, joint pains, numbness, tingling, weakness, tremor, suspicious skin lesions, abnormal bleeding/bruising, or enlarged lymph nodes. Mild intermittent constipation, occ hemorrhoid bleed if straining. No longer has heartburn (just  with major stress) Anxiety and depression are controlled Slight worsening of L hand tremor with stress.    PHYSICAL EXAM:  BP 120/70   Pulse 60   Ht '5\' 4"'$  (1.626 m)   Wt 159 lb 3.2 oz (72.2 kg)   SpO2 98%   BMI 27.33 kg/m   Wt Readings from Last 3 Encounters:  05/20/22 159 lb 3.2 oz (72.2 kg)  07/15/21 160 lb (72.6 kg)  02/18/21 167 lb 9.6 oz (76 kg)    General Appearance: Alert, cooperative, no distress, appears stated age Head: Normocephalic, without obvious abnormality, atraumatic Eyes: PERRL, conjunctiva/corneas clear, EOM's intact, fundi benign Ears: Normal TM's and external ear canals Nose: Nares normal, mucosa normal, no drainage or sinus tenderness Throat: Lips, mucosa, and tongue normal; teeth and gums normal Neck: Supple, no lymphadenopathy;  thyroid:  no enlargement/ tenderness/nodules; no carotid bruit or JVD Lungs: Clear to auscultation bilaterally without wheezes,  rales or ronchi; respirations unlabored Heart: Regular rate and rhythm, S1 and S2 normal, no murmur, rub or gallop Abdomen: Soft, non-tender, nondistended, normoactive bowel sounds, no masses, no hepatosplenomegaly Skin:  Skin color, texture, turgor normal, no rashes or lesions Lymph nodes: Cervical, supraclavicular, and axillary nodes normal Neurologic:  Normal strength, sensation and gait; reflexes 2+ and symmetric throughout. No tremor noted Psych: Normal mood, affect, hygiene and grooming.  Breast, pelvic and rectal exams were deferred to her GYN.   ASSESSMENT/PLAN:  Annual physical exam  Medicare annual wellness visit, subsequent  Impaired fasting glucose - continue low sugar diet, regular exercise, weight control - Plan: Comprehensive metabolic panel, Hemoglobin A1c  Hyperlipidemia, mixed - cont low cholesterol diet and statin - Plan: Lipid panel, atorvastatin (LIPITOR) 40 MG tablet  Obstructive sleep apnea - cont use of CPAP  Essential hypertension - controlled on current meds, cont - Plan: Comprehensive metabolic panel  Vitamin D deficiency - cont supplements  Medication monitoring encounter - Plan: Comprehensive metabolic panel, CBC with Differential/Platelet, Lipid panel  Tremor - LUE when stressed, per pt. Not appreciable today. Pt on BB. Pt reassured, to f/u if worsening or other neuro sx  Tremor, per pt--not appreciable today. Whether she truly has essential tremor, vs is tremulous with stress/anxiety isn't clear. She takes beta blocker, which would be helpful for tremor.  Clearly has no interference with her daily activities, treatment not indicated. Patient was reassured.   Discussed monthly self breast exams and yearly mammograms; at least 30 minutes of aerobic activity at least 5 days/week and weight-bearing exercise 2x/week; proper sunscreen use reviewed; healthy diet, including goals of calcium and vitamin D intake and alcohol recommendations (less than or equal  to 1 drink/day) reviewed; regular seatbelt use; changing batteries in smoke detectors, use of carbon monoxide detectors.  Immunization recommendations discussed--continue yearly high dose flu shots.  To get Tdap from the pharmacy. Updated COVID booster recommended when available in the Fall. Consider RSV vaccine when available. Colonoscopy recommendations--UTD  MOST form completed, full code, full care Need copies of living will and healthcare POA, discussed.   Medicare Attestation I have personally reviewed: The patient's medical and social history Their use of alcohol, tobacco or illicit drugs Their current medications and supplements The patient's functional ability including ADLs,fall risks, home safety risks, cognitive, and hearing and visual impairment Diet and physical activities Evidence for depression or mood disorders  The patient's weight, height, BMI have been recorded in the chart.  I have made referrals, counseling, and provided education to the patient based on review of the  above and I have provided the patient with a written personalized care plan for preventive services.     Vikki Ports, MD

## 2022-05-20 ENCOUNTER — Encounter: Payer: Self-pay | Admitting: Family Medicine

## 2022-05-20 ENCOUNTER — Ambulatory Visit (INDEPENDENT_AMBULATORY_CARE_PROVIDER_SITE_OTHER): Payer: PPO | Admitting: Family Medicine

## 2022-05-20 VITALS — BP 120/70 | HR 60 | Ht 64.0 in | Wt 159.2 lb

## 2022-05-20 DIAGNOSIS — G4733 Obstructive sleep apnea (adult) (pediatric): Secondary | ICD-10-CM

## 2022-05-20 DIAGNOSIS — I10 Essential (primary) hypertension: Secondary | ICD-10-CM | POA: Diagnosis not present

## 2022-05-20 DIAGNOSIS — Z5181 Encounter for therapeutic drug level monitoring: Secondary | ICD-10-CM | POA: Diagnosis not present

## 2022-05-20 DIAGNOSIS — E559 Vitamin D deficiency, unspecified: Secondary | ICD-10-CM

## 2022-05-20 DIAGNOSIS — E782 Mixed hyperlipidemia: Secondary | ICD-10-CM | POA: Diagnosis not present

## 2022-05-20 DIAGNOSIS — R251 Tremor, unspecified: Secondary | ICD-10-CM

## 2022-05-20 DIAGNOSIS — R7301 Impaired fasting glucose: Secondary | ICD-10-CM

## 2022-05-20 DIAGNOSIS — Z0000 Encounter for general adult medical examination without abnormal findings: Secondary | ICD-10-CM

## 2022-05-21 LAB — CBC WITH DIFFERENTIAL/PLATELET
Basophils Absolute: 0.1 10*3/uL (ref 0.0–0.2)
Basos: 1 %
EOS (ABSOLUTE): 0 10*3/uL (ref 0.0–0.4)
Eos: 1 %
Hematocrit: 43.3 % (ref 34.0–46.6)
Hemoglobin: 14.9 g/dL (ref 11.1–15.9)
Immature Grans (Abs): 0 10*3/uL (ref 0.0–0.1)
Immature Granulocytes: 0 %
Lymphocytes Absolute: 1.9 10*3/uL (ref 0.7–3.1)
Lymphs: 34 %
MCH: 31.9 pg (ref 26.6–33.0)
MCHC: 34.4 g/dL (ref 31.5–35.7)
MCV: 93 fL (ref 79–97)
Monocytes Absolute: 0.5 10*3/uL (ref 0.1–0.9)
Monocytes: 9 %
Neutrophils Absolute: 3.2 10*3/uL (ref 1.4–7.0)
Neutrophils: 55 %
Platelets: 262 10*3/uL (ref 150–450)
RBC: 4.67 x10E6/uL (ref 3.77–5.28)
RDW: 12.9 % (ref 11.7–15.4)
WBC: 5.7 10*3/uL (ref 3.4–10.8)

## 2022-05-21 LAB — COMPREHENSIVE METABOLIC PANEL
ALT: 22 IU/L (ref 0–32)
AST: 21 IU/L (ref 0–40)
Albumin/Globulin Ratio: 1.8 (ref 1.2–2.2)
Albumin: 5.2 g/dL — ABNORMAL HIGH (ref 3.9–4.9)
Alkaline Phosphatase: 82 IU/L (ref 44–121)
BUN/Creatinine Ratio: 9 — ABNORMAL LOW (ref 12–28)
BUN: 8 mg/dL (ref 8–27)
Bilirubin Total: 0.6 mg/dL (ref 0.0–1.2)
CO2: 24 mmol/L (ref 20–29)
Calcium: 9.9 mg/dL (ref 8.7–10.3)
Chloride: 94 mmol/L — ABNORMAL LOW (ref 96–106)
Creatinine, Ser: 0.86 mg/dL (ref 0.57–1.00)
Globulin, Total: 2.9 g/dL (ref 1.5–4.5)
Glucose: 92 mg/dL (ref 70–99)
Potassium: 4.3 mmol/L (ref 3.5–5.2)
Sodium: 133 mmol/L — ABNORMAL LOW (ref 134–144)
Total Protein: 8.1 g/dL (ref 6.0–8.5)
eGFR: 74 mL/min/{1.73_m2} (ref >59)

## 2022-05-21 LAB — LIPID PANEL
Chol/HDL Ratio: 2.2 ratio (ref 0.0–4.4)
Cholesterol, Total: 165 mg/dL (ref 100–199)
HDL: 76 mg/dL (ref >39)
LDL Chol Calc (NIH): 73 mg/dL (ref 0–99)
Triglycerides: 90 mg/dL (ref 0–149)
VLDL Cholesterol Cal: 16 mg/dL (ref 5–40)

## 2022-05-21 LAB — HEMOGLOBIN A1C
Est. average glucose Bld gHb Est-mCnc: 117 mg/dL
Hgb A1c MFr Bld: 5.7 % — ABNORMAL HIGH (ref 4.8–5.6)

## 2022-05-21 MED ORDER — ATORVASTATIN CALCIUM 40 MG PO TABS: 40.0000 mg | ORAL_TABLET | Freq: Every day | ORAL | 3 refills | Status: DC

## 2022-05-31 DIAGNOSIS — G4733 Obstructive sleep apnea (adult) (pediatric): Secondary | ICD-10-CM | POA: Diagnosis not present

## 2022-05-31 DIAGNOSIS — I10 Essential (primary) hypertension: Secondary | ICD-10-CM | POA: Diagnosis not present

## 2022-05-31 DIAGNOSIS — E559 Vitamin D deficiency, unspecified: Secondary | ICD-10-CM | POA: Diagnosis not present

## 2022-05-31 DIAGNOSIS — E785 Hyperlipidemia, unspecified: Secondary | ICD-10-CM | POA: Diagnosis not present

## 2022-05-31 DIAGNOSIS — Z7982 Long term (current) use of aspirin: Secondary | ICD-10-CM | POA: Diagnosis not present

## 2022-05-31 DIAGNOSIS — K5900 Constipation, unspecified: Secondary | ICD-10-CM | POA: Diagnosis not present

## 2022-05-31 DIAGNOSIS — F3342 Major depressive disorder, recurrent, in full remission: Secondary | ICD-10-CM | POA: Diagnosis not present

## 2022-05-31 DIAGNOSIS — K649 Unspecified hemorrhoids: Secondary | ICD-10-CM | POA: Diagnosis not present

## 2022-05-31 DIAGNOSIS — E663 Overweight: Secondary | ICD-10-CM | POA: Diagnosis not present

## 2022-06-03 DIAGNOSIS — N6321 Unspecified lump in the left breast, upper outer quadrant: Secondary | ICD-10-CM | POA: Diagnosis not present

## 2022-06-03 DIAGNOSIS — R928 Other abnormal and inconclusive findings on diagnostic imaging of breast: Secondary | ICD-10-CM | POA: Diagnosis not present

## 2022-06-03 DIAGNOSIS — N6323 Unspecified lump in the left breast, lower outer quadrant: Secondary | ICD-10-CM | POA: Diagnosis not present

## 2022-06-09 DIAGNOSIS — C50812 Malignant neoplasm of overlapping sites of left female breast: Secondary | ICD-10-CM | POA: Diagnosis not present

## 2022-06-09 DIAGNOSIS — C50512 Malignant neoplasm of lower-outer quadrant of left female breast: Secondary | ICD-10-CM | POA: Diagnosis not present

## 2022-06-10 ENCOUNTER — Telehealth: Payer: Self-pay | Admitting: Hematology and Oncology

## 2022-06-10 ENCOUNTER — Encounter: Payer: Self-pay | Admitting: Obstetrics

## 2022-06-10 NOTE — Telephone Encounter (Signed)
Spoke to patient to confirm morning clinic appointment for 9/6, [facility] will send paperwork to patient

## 2022-06-15 ENCOUNTER — Encounter: Payer: Self-pay | Admitting: *Deleted

## 2022-06-16 ENCOUNTER — Inpatient Hospital Stay: Payer: PPO | Attending: Hematology and Oncology

## 2022-06-16 ENCOUNTER — Ambulatory Visit: Payer: PPO | Attending: General Surgery | Admitting: Physical Therapy

## 2022-06-16 ENCOUNTER — Ambulatory Visit: Payer: Self-pay | Admitting: General Surgery

## 2022-06-16 ENCOUNTER — Encounter: Payer: Self-pay | Admitting: Internal Medicine

## 2022-06-16 ENCOUNTER — Inpatient Hospital Stay (HOSPITAL_BASED_OUTPATIENT_CLINIC_OR_DEPARTMENT_OTHER): Payer: PPO | Admitting: Hematology and Oncology

## 2022-06-16 ENCOUNTER — Encounter: Payer: Self-pay | Admitting: Physical Therapy

## 2022-06-16 ENCOUNTER — Other Ambulatory Visit: Payer: Self-pay

## 2022-06-16 ENCOUNTER — Ambulatory Visit
Admission: RE | Admit: 2022-06-16 | Discharge: 2022-06-16 | Disposition: A | Discharge status: Home / Self Care | Payer: PPO | Source: Ambulatory Visit | Attending: Radiation Oncology | Admitting: Radiation Oncology

## 2022-06-16 ENCOUNTER — Encounter: Payer: Self-pay | Admitting: Genetic Counselor

## 2022-06-16 ENCOUNTER — Inpatient Hospital Stay: Payer: PPO | Admitting: Licensed Clinical Social Worker

## 2022-06-16 ENCOUNTER — Other Ambulatory Visit: Payer: Self-pay | Admitting: *Deleted

## 2022-06-16 ENCOUNTER — Encounter: Payer: Self-pay | Admitting: Emergency Medicine

## 2022-06-16 ENCOUNTER — Encounter: Payer: Self-pay | Admitting: *Deleted

## 2022-06-16 DIAGNOSIS — Z79899 Other long term (current) drug therapy: Secondary | ICD-10-CM

## 2022-06-16 DIAGNOSIS — Z8349 Family history of other endocrine, nutritional and metabolic diseases: Secondary | ICD-10-CM | POA: Diagnosis not present

## 2022-06-16 DIAGNOSIS — C50412 Malignant neoplasm of upper-outer quadrant of left female breast: Secondary | ICD-10-CM | POA: Diagnosis not present

## 2022-06-16 DIAGNOSIS — N6325 Unspecified lump in the left breast, overlapping quadrants: Secondary | ICD-10-CM | POA: Insufficient documentation

## 2022-06-16 DIAGNOSIS — Z8489 Family history of other specified conditions: Secondary | ICD-10-CM | POA: Diagnosis not present

## 2022-06-16 DIAGNOSIS — E785 Hyperlipidemia, unspecified: Secondary | ICD-10-CM | POA: Diagnosis not present

## 2022-06-16 DIAGNOSIS — Z8719 Personal history of other diseases of the digestive system: Secondary | ICD-10-CM | POA: Insufficient documentation

## 2022-06-16 DIAGNOSIS — Z8249 Family history of ischemic heart disease and other diseases of the circulatory system: Secondary | ICD-10-CM

## 2022-06-16 DIAGNOSIS — Z823 Family history of stroke: Secondary | ICD-10-CM | POA: Insufficient documentation

## 2022-06-16 DIAGNOSIS — C50512 Malignant neoplasm of lower-outer quadrant of left female breast: Secondary | ICD-10-CM | POA: Insufficient documentation

## 2022-06-16 DIAGNOSIS — R293 Abnormal posture: Secondary | ICD-10-CM | POA: Insufficient documentation

## 2022-06-16 DIAGNOSIS — Z818 Family history of other mental and behavioral disorders: Secondary | ICD-10-CM

## 2022-06-16 DIAGNOSIS — Z800 Family history of malignant neoplasm of digestive organs: Secondary | ICD-10-CM | POA: Diagnosis not present

## 2022-06-16 DIAGNOSIS — Z806 Family history of leukemia: Secondary | ICD-10-CM | POA: Diagnosis not present

## 2022-06-16 DIAGNOSIS — Z170 Estrogen receptor positive status [ER+]: Secondary | ICD-10-CM | POA: Diagnosis not present

## 2022-06-16 DIAGNOSIS — Z8049 Family history of malignant neoplasm of other genital organs: Secondary | ICD-10-CM

## 2022-06-16 DIAGNOSIS — Z1379 Encounter for other screening for genetic and chromosomal anomalies: Secondary | ICD-10-CM | POA: Insufficient documentation

## 2022-06-16 DIAGNOSIS — I10 Essential (primary) hypertension: Secondary | ICD-10-CM | POA: Diagnosis not present

## 2022-06-16 LAB — CBC WITH DIFFERENTIAL (CANCER CENTER ONLY)
Abs Immature Granulocytes: 0 10*3/uL (ref 0.00–0.07)
Basophils Absolute: 0 10*3/uL (ref 0.0–0.1)
Basophils Relative: 1 %
Eosinophils Absolute: 0.1 10*3/uL (ref 0.0–0.5)
Eosinophils Relative: 2 %
HCT: 40.5 % (ref 36.0–46.0)
Hemoglobin: 14.1 g/dL (ref 12.0–15.0)
Immature Granulocytes: 0 %
Lymphocytes Relative: 29 %
Lymphs Abs: 1.4 10*3/uL (ref 0.7–4.0)
MCH: 31.7 pg (ref 26.0–34.0)
MCHC: 34.8 g/dL (ref 30.0–36.0)
MCV: 91 fL (ref 80.0–100.0)
Monocytes Absolute: 0.4 10*3/uL (ref 0.1–1.0)
Monocytes Relative: 9 %
Neutro Abs: 2.9 10*3/uL (ref 1.7–7.7)
Neutrophils Relative %: 59 %
Platelet Count: 234 10*3/uL (ref 150–400)
RBC: 4.45 MIL/uL (ref 3.87–5.11)
RDW: 12.1 % (ref 11.5–15.5)
WBC Count: 4.8 10*3/uL (ref 4.0–10.5)
nRBC: 0 % (ref 0.0–0.2)

## 2022-06-16 LAB — CMP (CANCER CENTER ONLY)
ALT: 17 U/L (ref 0–44)
AST: 17 U/L (ref 15–41)
Albumin: 4.6 g/dL (ref 3.5–5.0)
Alkaline Phosphatase: 63 U/L (ref 38–126)
Anion gap: 6 (ref 5–15)
BUN: 10 mg/dL (ref 8–23)
CO2: 28 mmol/L (ref 22–32)
Calcium: 9.7 mg/dL (ref 8.9–10.3)
Chloride: 101 mmol/L (ref 98–111)
Creatinine: 0.81 mg/dL (ref 0.44–1.00)
GFR, Estimated: >60 mL/min (ref >60)
Glucose, Bld: 94 mg/dL (ref 70–99)
Potassium: 3.9 mmol/L (ref 3.5–5.1)
Sodium: 135 mmol/L (ref 135–145)
Total Bilirubin: 0.7 mg/dL (ref 0.3–1.2)
Total Protein: 7.6 g/dL (ref 6.5–8.1)

## 2022-06-16 LAB — GENETIC SCREENING ORDER

## 2022-06-16 NOTE — Progress Notes (Signed)
[place] [facility]  Initial Assessment   Carol Harris is a 66 y.o. year old female accompanied by spouse, Darnell Level, and daughter, Merleen Nicely. Clinical Social Work was referred by  [facility]  for assessment of psychosocial needs.   SDOH (Social Determinants of Health) assessments performed: Yes SDOH Interventions    Flowsheet Row Clinical Support from 06/16/2022 in [facility] Office Visit from 05/20/2022 in [place] Visit from 01/21/2021 in [place] Visit from 11/13/2019 in [place] Visit from 11/03/2018 in [place] Interventions Intervention Not Indicated -- -- -- --  Housing Interventions Intervention Not Indicated -- -- -- --  Transportation Interventions Intervention Not Indicated -- -- -- --  Utilities Interventions Intervention Not Indicated -- -- -- --  Depression Interventions/Treatment  -- PHQ2-9 Score <4 Follow-up Not Indicated, Currently on Treatment  [under the care of psych] Currently on Treatment Counseling, Referral to Psychiatry, Medication Referral to Psychiatry, Medication, Currently on Treatment  Financial Strain Interventions Intervention Not Indicated -- -- -- --       SDOH Screenings   Food Insecurity: No Food Insecurity (06/16/2022)  Housing: Low Risk  (06/16/2022)  Transportation Needs: No Transportation Needs (06/16/2022)  Utilities: Not At Risk (06/16/2022)  Depression (PHQ2-9): Low Risk  (05/20/2022)  Financial Resource Strain: Low Risk  (06/16/2022)  Tobacco Use: Low Risk  (06/16/2022)     Distress Screen completed: Yes    06/16/2022   11:37 AM  ONCBCN DISTRESS SCREENING  Screening Type Initial Screening  Distress experienced in past week (1-10) 4      Family/Social Information:  Housing Arrangement: patient lives with spouse Family members/support persons in your life? Family Transportation concerns: no   Financial concerns: No Type of concern: None Food access concerns: no Religious or spiritual practice: Not known Services Currently in place:  n/a  Coping/ Adjustment to diagnosis: Patient understands treatment plan and what happens next? yes, is ready to move forward and have it taken care of. Husband is [facility] at [facility] for prostate cancer, so they are familiar with the clinic Concerns about diagnosis and/or treatment: I'm not especially worried about anything Patient reported stressors:  general adjustment Current coping skills/ strengths: Ability for insight , Capable of independent living , Communication skills , Motivation for treatment/growth , and Supportive family/friends     SUMMARY: Current SDOH Barriers:  None noted today  Clinical Social Work Clinical Goal(s):  No clinical social work goals at this time  Interventions: Discussed common feeling and emotions when being diagnosed with cancer, and the importance of support during treatment Informed patient of the support team roles and support services at [facility] Provided [place] contact information and encouraged patient to call with any questions or concerns   Follow Up Plan: Patient will contact CSW with any support or resource needs Patient verbalizes understanding of plan: Yes    Jisell Majer E Brenna Friesenhahn, LCSW

## 2022-06-16 NOTE — Therapy (Signed)
OUTPATIENT PHYSICAL THERAPY BREAST CANCER BASELINE EVALUATION   Patient Name: Carol Harris MRN: [id number] DOB:12-25-1955, 66 y.o., female Today's Date: 06/16/2022   PT End of Session - 06/16/22 1055     Visit Number 1    Number of Visits 2    Date for PT Re-Evaluation 08/11/22    PT Start Time 1012    PT Stop Time 1042    PT Time Calculation (min) 30 min    Activity Tolerance Patient tolerated treatment well    Behavior During Therapy [facility] for tasks assessed/performed             Past Medical History:  Diagnosis Date   Anxiety    Colon polyp    GERD (gastroesophageal reflux disease)    HTN (hypertension), benign 09/05/2011   Hyperlipidemia, mixed 09/05/2011   Leukopenia 09/05/2011   Past Surgical History:  Procedure Laterality Date   KNEE ARTHROSCOPY Right 2005 approx   torn meniscus   TUBAL LIGATION     twice (first failed)   uterine ablation     Patient Active Problem List   Diagnosis Date Noted   Malignant neoplasm of lower-outer quadrant of left breast of female, estrogen receptor positive ([place]) 06/16/2022   Restless leg syndrome 11/08/2018   Left ventricular hypertrophy 11/08/2018   Depression 11/03/2018   Family history of colon cancer in mother 11/03/2018   History of vitamin D deficiency 11/03/2018   Night terrors, adult 11/03/2018   History of CVA (cerebrovascular accident) 07/12/2014   Obstructive sleep apnea 01/08/2014   GERD (gastroesophageal reflux disease) 12/19/2013   Leukopenia 09/05/2011   HTN (hypertension), benign 09/05/2011   Hyperlipidemia, mixed 09/05/2011    REFERRING PROVIDER: Dr. Autumn Messing  REFERRING DIAG: Left breast cancer  THERAPY DIAG:  Malignant neoplasm of lower-outer quadrant of left breast of female, estrogen receptor positive ([place])  Abnormal posture  Rationale for Evaluation and Treatment Rehabilitation  ONSET DATE: 06/10/2022  SUBJECTIVE                                                                                                                                                                                            SUBJECTIVE STATEMENT: Patient reports she is here today to be seen by her medical team for her newly diagnosed left breast cancer.   PERTINENT HISTORY:  Patient was diagnosed on 06/10/2022 with left grade 1 invasive ductal carcinoma breast cancer. It measures 4 mm and is located in the lower outer quadrant. It is ER/PR positive and HER2 negative with a Ki67 of 15%.   PATIENT GOALS   reduce lymphedema risk and learn post op HEP.  PAIN:  Are you having pain? No   PRECAUTIONS: Active CA   HAND DOMINANCE: right  WEIGHT BEARING RESTRICTIONS No  FALLS:  Has patient fallen in last 6 months? No  LIVING ENVIRONMENT: Patient lives with: her husband Lives in: House/apartment Has following equipment at home: None  OCCUPATION: retired Therapist, sports  LEISURE: She walks a few times per week and does stretching and some strengthening exercises 3x/week  PRIOR LEVEL OF FUNCTION: Independent   OBJECTIVE  COGNITION:  Overall cognitive status: Within functional limits for tasks assessed    POSTURE:  Forward head and rounded shoulders posture  UPPER EXTREMITY AROM/PROM:  A/PROM RIGHT   eval   Shoulder extension 41  Shoulder flexion 154  Shoulder abduction 157  Shoulder internal rotation 66  Shoulder external rotation 85    (Blank rows = not tested)  A/PROM LEFT   eval  Shoulder extension 39  Shoulder flexion 149  Shoulder abduction 147  Shoulder internal rotation 65  Shoulder external rotation 81    (Blank rows = not tested)   CERVICAL AROM: All within normal limits  UPPER EXTREMITY STRENGTH: WNL   LYMPHEDEMA ASSESSMENTS:   LANDMARK RIGHT   eval  10 cm proximal to olecranon process 27.5  Olecranon process 25  10 cm proximal to ulnar styloid process 20.6  Just proximal to ulnar styloid process 15.6  Across hand at thumb web space 19  At base of 2nd digit 6.2   (Blank rows = not tested)  LANDMARK LEFT   eval  10 cm proximal to olecranon process 27.4  Olecranon process 25.3  10 cm proximal to ulnar styloid process 20.3  Just proximal to ulnar styloid process 15.3  Across hand at thumb web space 18.7  At base of 2nd digit 6.1  (Blank rows = not tested)   L-DEX LYMPHEDEMA SCREENING:  The patient was assessed using the L-Dex machine today to produce a lymphedema index baseline score. The patient will be reassessed on a regular basis (typically every 3 months) to obtain new L-Dex scores. If the score is > 6.5 points away from his/her baseline score indicating onset of subclinical lymphedema, it will be recommended to wear a compression garment for 4 weeks, 12 hours per day and then be reassessed. If the score continues to be > 6.5 points from baseline at reassessment, we will initiate lymphedema treatment. Assessing in this manner has a 95% rate of preventing clinically significant lymphedema.   L-DEX FLOWSHEETS - 06/16/22 1000       L-DEX LYMPHEDEMA SCREENING   Measurement Type Unilateral    L-DEX MEASUREMENT EXTREMITY Upper Extremity    POSITION  Standing    DOMINANT SIDE Right    At Risk Side Left    BASELINE SCORE (UNILATERAL) 1.2              QUICK DASH SURVEY:  Katina Dung - 06/16/22 0001     Open a tight or new jar No difficulty    Do heavy household chores (wash walls, wash floors) No difficulty    Carry a shopping bag or briefcase No difficulty    Wash your back No difficulty    Use a knife to cut food No difficulty    Recreational activities in which you take some force or impact through your arm, shoulder, or hand (golf, hammering, tennis) No difficulty    During the past week, to what extent has your arm, shoulder or hand problem interfered with your normal social activities with family,  friends, neighbors, or groups? Not at all    During the past week, to what extent has your arm, shoulder or hand problem limited your  work or other regular daily activities Not at all    Arm, shoulder, or hand pain. None    Tingling (pins and needles) in your arm, shoulder, or hand None    Difficulty Sleeping No difficulty    DASH Score 0 %              PATIENT EDUCATION:  Education details: Lymphedema risk reduction and post op shoulder/posture HEP Person educated: Patient Education method: Explanation, Demonstration, Handout Education comprehension: Patient verbalized understanding and returned demonstration   HOME EXERCISE PROGRAM: Patient was instructed today in a home exercise program today for post op shoulder range of motion. These included active assist shoulder flexion in sitting, scapular retraction, wall walking with shoulder abduction, and hands behind head external rotation.  She was encouraged to do these twice a day, holding 3 seconds and repeating 5 times when permitted by her physician.   ASSESSMENT:  CLINICAL IMPRESSION: Patient was diagnosed on 06/10/2022 with left grade 1 invasive ductal carcinoma breast cancer. It measures 4 mm and is located in the lower outer quadrant. It is ER/PR positive and HER2 negative with a Ki67 of 15%. Her multidisciplinary medical team met prior to her assessments to determine a recommended treatment plan. She is planning to have a left lumpectomy and sentinel node biopsy followed by radiation and anti-estrogen therapy. She will benefit from a post op PT reassessment to determine needs and from L-Dex screens every 3 months for 2 years to detect subclinical lymphedema.  Pt will benefit from skilled therapeutic intervention to improve on the following deficits: Decreased knowledge of precautions, impaired UE functional use, pain, decreased ROM, postural dysfunction.   PT treatment/interventions: ADL/self-care home management, pt/family education, therapeutic exercise  REHAB POTENTIAL: Excellent  CLINICAL DECISION MAKING: Stable/uncomplicated  EVALUATION COMPLEXITY:  Low   GOALS: Goals reviewed with patient? YES  LONG TERM GOALS: (STG=LTG)    Name Target Date Goal status  1 Pt will be able to verbalize understanding of pertinent lymphedema risk reduction practices relevant to her dx specifically related to skin care.  Baseline:  No knowledge 06/16/2022 Achieved at eval  2 Pt will be able to return demo and/or verbalize understanding of the post op HEP related to regaining shoulder ROM. Baseline:  No knowledge 06/16/2022 Achieved at eval  3 Pt will be able to verbalize understanding of the importance of attending the post op After Breast CA Class for further lymphedema risk reduction education and therapeutic exercise.  Baseline:  No knowledge 06/16/2022 Achieved at eval  4 Pt will demo she has regained full shoulder ROM and function post operatively compared to baselines.  Baseline: See objective measurements taken today. 08/11/2022      PLAN: PT FREQUENCY/DURATION: EVAL and 1 follow up appointment.   PLAN FOR NEXT SESSION: will reassess 3-4 weeks post op to determine needs.   Patient will follow up at outpatient cancer rehab 3-4 weeks following surgery.  If the patient requires physical therapy at that time, a specific plan will be dictated and sent to the referring physician for approval. The patient was educated today on appropriate basic range of motion exercises to begin post operatively and the importance of attending the After Breast Cancer class following surgery.  Patient was educated today on lymphedema risk reduction practices as it pertains to recommendations that will benefit the patient immediately  following surgery.  She verbalized good understanding.    Physical Therapy Information for After Breast Cancer Surgery/Treatment:  Lymphedema is a swelling condition that you may be at risk for in your arm if you have lymph nodes removed from the armpit area.  After a sentinel node biopsy, the risk is approximately 5-9% and is higher after an  axillary node dissection.  There is treatment available for this condition and it is not life-threatening.  Contact your physician or physical therapist with concerns. You may begin the 4 shoulder/posture exercises (see additional sheet) when permitted by your physician (typically a week after surgery).  If you have drains, you may need to wait until those are removed before beginning range of motion exercises.  A general recommendation is to not lift your arms above shoulder height until drains are removed.  These exercises should be done to your tolerance and gently.  This is not a "no pain/no gain" type of recovery so listen to your body and stretch into the range of motion that you can tolerate, stopping if you have pain.  If you are having immediate reconstruction, ask your plastic surgeon about doing exercises as he or she may want you to wait. We encourage you to attend the free one time ABC (After Breast Cancer) class offered by [facility].  You will learn information related to lymphedema risk, prevention and treatment and additional exercises to regain mobility following surgery.  You can call [phone] for more information.  This is offered the 1st and 3rd Monday of each month.  You only attend the class one time. While undergoing any medical procedure or treatment, try to avoid blood pressure being taken or needle sticks from occurring on the arm on the side of cancer.   This recommendation begins after surgery and continues for the rest of your life.  This may help reduce your risk of getting lymphedema (swelling in your arm). An excellent resource for those seeking information on lymphedema is the National Lymphedema Network's web site. It can be accessed at [place].org If you notice swelling in your hand, arm or breast at any time following surgery (even if it is many years from now), please contact your doctor or physical therapist to discuss this.  Lymphedema  can be treated at any time but it is easier for you if it is treated early on.  If you feel like your shoulder motion is not returning to normal in a reasonable amount of time, please contact your surgeon or physical therapist.  [facility] [phone]. [address], [address], [place] [address]  ABC CLASS After Breast Cancer Class  After Breast Cancer Class is a specially designed exercise class to assist you in a safe recover after having breast cancer surgery.  In this class you will learn how to get back to full function whether your drains were just removed or if you had surgery a month ago.  This one-time class is held the 1st and 3rd Monday of every month from 11:00 a.m. until 12:00 noon virtually.  This class is FREE and space is limited. For more information or to register for the next available class, call [phone].  Class Goals  Understand specific stretches to improve the flexibility of you chest and shoulder. Learn ways to safely strengthen your upper body and improve your posture. Understand the warning signs of infection and why you may be at risk for an arm infection. Learn  about Lymphedema and prevention.  ** You do not attend this class until after surgery.  Drains must be removed to participate  Patient was instructed today in a home exercise program today for post op shoulder range of motion. These included active assist shoulder flexion in sitting, scapular retraction, wall walking with shoulder abduction, and hands behind head external rotation.  She was encouraged to do these twice a day, holding 3 seconds and repeating 5 times when permitted by her physician.  Annia Friendly, [place] 06/16/22 11:01 AM

## 2022-06-16 NOTE — Progress Notes (Signed)
[place] NOTE  Patient Care Team: Rita Ohara, MD as PCP - General (Family Medicine) Jovita Kussmaul, MD as Consulting Physician (General Surgery) Nicholas Lose, MD as Consulting Physician (Hematology and Oncology) Kyung Rudd, MD as Consulting Physician (Radiation Oncology) Rockwell Germany, RN as Oncology Nurse Navigator Mauro Kaufmann, RN as Oncology Nurse Navigator  CHIEF COMPLAINTS/PURPOSE OF CONSULTATION:  Newly diagnosed breast cancer  HISTORY OF PRESENTING ILLNESS:  Carol Harris 66 y.o. female is here because of recent diagnosis of left breast cancer.  Patient had a routine screening mammogram that detected a left breast mass measuring 4 mm.  She underwent ultrasound-guided biopsy which came back as grade 1 IDC with lobular features that was ER/PR positive HER2 negative.  She was presented this morning to the multidisciplinary tumor board and she is here today to discuss treatment plan accompanied by her husband and her daughter.  I reviewed her records extensively and collaborated the history with the patient.  SUMMARY OF ONCOLOGIC HISTORY: Oncology History  Malignant neoplasm of lower-outer quadrant of left breast of female, estrogen receptor positive ([place])  06/16/2022 Initial Diagnosis   Screening mammogram detected left breast mass at 3 o'clock position 0.4 cm, axilla negative, ultrasound-guided biopsy revealed grade 1 IDC with lobular features with DCIS and calcifications ER 95% PR 80% HER2 negative, Ki-67 15%   06/16/2022 Cancer Staging   Staging form: Breast, AJCC 8th Edition - Clinical: Stage IA (cT1a, cN0, cM0, G1, ER+, PR+, HER2-) - Signed by Nicholas Lose, MD on 06/16/2022 Histologic grading system: 3 grade system      MEDICAL HISTORY:  Past Medical History:  Diagnosis Date   Anxiety    Colon polyp    GERD (gastroesophageal reflux disease)    HTN (hypertension), benign 09/05/2011   Hyperlipidemia, mixed 09/05/2011   Leukopenia  09/05/2011    SURGICAL HISTORY: Past Surgical History:  Procedure Laterality Date   KNEE ARTHROSCOPY Right 2005 approx   torn meniscus   TUBAL LIGATION     twice (first failed)   uterine ablation      SOCIAL HISTORY: Social History   Socioeconomic History   Marital status: Married    Spouse name: Bruce   Number of children: 3   Years of education: BS   Highest education level: Not on file  Occupational History    Employer: [employer]    Comment: [employer]   Tobacco Use   Smoking status: Never   Smokeless tobacco: Never  Vaping Use   Vaping Use: Never used  Substance and Sexual Activity   Alcohol use: Yes    Alcohol/week: 1.0 standard drink of alcohol    Types: 1 Glasses of wine per week    Comment: 1   Drug use: No   Sexual activity: Not Currently    Comment: husband with prostate cancer  Other Topics Concern   Not on file  Social History Narrative   Patient is right handed and consumes 2 caffeinated beverages daily   Married, no pets.   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      Update 05/2022   Social Determinants of Health   Financial Resource Strain: Not on file  Food Insecurity: Not on file  Transportation Needs: Not on file  Physical Activity: Not on file  Stress: Not on file  Social Connections:  Not on file  Intimate Partner Violence: Not on file    FAMILY HISTORY: Family History  Problem Relation Age of Onset   Hypertension Father    Hyperlipidemia Father    Stroke Father        multiple and TIA's   Sleep apnea Father    Hypertension Mother    Uterine cancer Mother    Colon cancer Mother 62   Bipolar disorder Daughter        schizo affective   Uterine cancer Sister    Hyperlipidemia Sister    Acute myelogenous leukemia Brother        s/p stem cell transplant (in 35's)   Endometriosis  Sister    Hyperlipidemia Sister     ALLERGIES:  has No Known Allergies.  MEDICATIONS:  Current Outpatient Medications  Medication Sig Dispense Refill   acetaminophen (TYLENOL) 500 MG tablet Take 500 mg by mouth every 6 (six) hours as needed. (Patient not taking: Reported on 03/24/2020)     aspirin EC 81 MG tablet Take 81 mg by mouth daily.     atenolol (TENORMIN) 25 MG tablet TAKE 1 TABLET BY MOUTH TWICE A DAY 180 tablet 1   atorvastatin (LIPITOR) 40 MG tablet Take 1 tablet (40 mg total) by mouth daily. 90 tablet 3   buPROPion (WELLBUTRIN XL) 150 MG 24 hr tablet Take 450 mg by mouth every morning.     Cholecalciferol (VITAMIN D-3) 125 MCG (5000 UT) TABS Take 1 tablet by mouth 4 (four) times a week.     cyanocobalamin 100 MCG tablet Take 1,000 mcg by mouth daily.     losartan (COZAAR) 50 MG tablet TAKE 1 TABLET BY MOUTH EVERY DAY 90 tablet 1   VITAMIN B COMPLEX-C PO      No current facility-administered medications for this visit.    REVIEW OF SYSTEMS:   Constitutional: Denies fevers, chills or abnormal night sweats Breast:  Denies any palpable lumps or discharge All other systems were reviewed with the patient and are negative.  PHYSICAL EXAMINATION: ECOG PERFORMANCE STATUS: 0 - Asymptomatic  Vitals:   06/16/22 0848  BP: (!) 154/76  Pulse: 65  Resp: 18  Temp: 97.8 F (36.6 C)  SpO2: 100%   Filed Weights   06/16/22 0848  Weight: 159 lb 4.8 oz (72.3 kg)    GENERAL:alert, no distress and comfortable    LABORATORY DATA:  I have reviewed the data as listed Lab Results  Component Value Date   WBC 4.8 06/16/2022   HGB 14.1 06/16/2022   HCT 40.5 06/16/2022   MCV 91.0 06/16/2022   PLT 234 06/16/2022   Lab Results  Component Value Date   NA 135 06/16/2022   K 3.9 06/16/2022   CL 101 06/16/2022   CO2 28 06/16/2022    RADIOGRAPHIC STUDIES: I have personally reviewed the radiological reports and agreed with the findings in the report.  ASSESSMENT AND PLAN:   Malignant neoplasm of lower-outer quadrant of left breast of female, estrogen receptor positive ([place]) Screening mammogram detected left breast mass at 3 o'clock position 0.4 cm, axilla negative, ultrasound-guided biopsy revealed grade 1 IDC with lobular features with DCIS and calcifications ER 95% PR 80% HER2 negative, Ki-67 15%  Pathology and radiology counseling:Discussed with the patient, the details of pathology including the type of breast cancer,the clinical staging, the significance of ER, PR and HER-2/neu receptors and the implications for treatment. After reviewing the pathology in detail, we proceeded to discuss the different treatment options  between surgery, radiation, chemotherapy, antiestrogen therapies.  Recommendations: 1. Breast conserving surgery followed by 2. Adjuvant radiation therapy followed by 3. Adjuvant antiestrogen therapy  Return to clinic after surgery to discuss final pathology report and then determine the adjuvant treatment plan.  All questions were answered. The patient knows to call the clinic with any problems, questions or concerns.    Harriette Ohara, MD 06/16/22

## 2022-06-16 NOTE — Research (Signed)
[employer] 2021-05 - Specimen Collection Study to Evaluate Biomarkers in Subjects with Cancer   INTRO STUDY/CONSENTS  Patient Carol Harris was identified by this Clinical Research Nurse as a potential candidate for the above listed study.  This Clinical Research Nurse met with Dorien Chihuahua, [id number], on 06/16/22 in a manner and location that ensures patient privacy to discuss participation in the above listed research study.  Patient is Accompanied by daughter and spouse .  A copy of the informed consent document with embedded HIPAA language was provided to the patient.  Patient reads, speaks, and understands [place].   Patient was provided with the business card of this Nurse and encouraged to contact the research team with any questions.  Approximately 20 minutes were spent with the patient reviewing the informed consent documents.  Patient was provided the option of taking informed consent documents home to review and was encouraged to review at their convenience with their support network, including other care providers. Patient took the consent documents home to review.  Will f/u with patient in the next few days to determine interest.  Wells Guiles 'Donell Sievert, RN, BSN Clinical Research Nurse I 06/16/22 11:27 AM

## 2022-06-16 NOTE — Progress Notes (Signed)
Radiation Oncology         [phone] ________________________________  Name: Carol Harris        MRN: [id number]  Date of Service: 06/16/2022 DOB: 08-Oct-1956  GL:OVFIE, Tera Helper, MD  Jovita Kussmaul, MD     REFERRING PHYSICIAN: Autumn Messing III, MD   DIAGNOSIS: The encounter diagnosis was Malignant neoplasm of lower-outer quadrant of left breast of female, estrogen receptor positive ([place]).   HISTORY OF PRESENT ILLNESS: Carol Harris is a 66 y.o. female seen in the multidisciplinary breast clinic for a new diagnosis of left breast cancer. The patient was noted to have a screening detected mass in the left breast in the 3:00 position. Further diagnostic work up included ultrasound on 06/03/22 showing a 4 mm mass at the 3:00 position and her left axilla was negative for adenopathy. A biopsy showed a grade 1 invasive ductal carcinoma with lobular features, and associated low grade DCIS with calcifications. Her invasive biopsy showed ER/PR positivity, HER2 was negative with a Ki 67 of 15%. She's seen today to discuss treatment recommendations of her cancer.     PREVIOUS RADIATION THERAPY: No   PAST MEDICAL HISTORY:  Past Medical History:  Diagnosis Date   Anxiety    Colon polyp    GERD (gastroesophageal reflux disease)    HTN (hypertension), benign 09/05/2011   Hyperlipidemia, mixed 09/05/2011   Leukopenia 09/05/2011       PAST SURGICAL HISTORY: Past Surgical History:  Procedure Laterality Date   KNEE ARTHROSCOPY Right 2005 approx   torn meniscus   TUBAL LIGATION     twice (first failed)   uterine ablation       FAMILY HISTORY:  Family History  Problem Relation Age of Onset   Hypertension Father    Hyperlipidemia Father    Stroke Father        multiple and TIA's   Sleep apnea Father    Hypertension Mother    Uterine cancer Mother    Colon cancer Mother 59   Bipolar disorder Daughter        schizo affective   Uterine cancer Sister    Hyperlipidemia Sister     Acute myelogenous leukemia Brother        s/p stem cell transplant (in 38's)   Endometriosis Sister    Hyperlipidemia Sister      SOCIAL HISTORY:  reports that she has never smoked. She has never used smokeless tobacco. She reports current alcohol use of about 1.0 standard drink of alcohol per week. She reports that she does not use drugs. The patient is married and lives in [place]. 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 She and her husband enjoy travelling and are familiar with radiation as he has been receiving treatment for prostate cancer. They are also accompanied by their daughter who is a Immunologist at [employer] as well.   ALLERGIES: Patient has no known allergies.   MEDICATIONS:  Current Outpatient Medications  Medication Sig Dispense Refill   acetaminophen (TYLENOL) 500 MG tablet Take 500 mg by mouth every 6 (six) hours as needed. (Patient not taking: Reported on 03/24/2020)     aspirin EC 81 MG tablet Take 81 mg by mouth daily.     atenolol (TENORMIN) 25 MG tablet TAKE 1 TABLET BY MOUTH TWICE A DAY 180 tablet 1   atorvastatin (LIPITOR) 40 MG tablet Take 1 tablet (40 mg total) by mouth  daily. 90 tablet 3   buPROPion (WELLBUTRIN XL) 150 MG 24 hr tablet Take 450 mg by mouth every morning.     Cholecalciferol (VITAMIN D-3) 125 MCG (5000 UT) TABS Take 1 tablet by mouth 4 (four) times a week.     cyanocobalamin 100 MCG tablet Take 1,000 mcg by mouth daily.     losartan (COZAAR) 50 MG tablet TAKE 1 TABLET BY MOUTH EVERY DAY 90 tablet 1   VITAMIN B COMPLEX-C PO      No current facility-administered medications for this encounter.     REVIEW OF SYSTEMS: On review of systems, the patient reports that she is doing well overall now that she knows that her case is very early. She's had some bruising of her left breast since her biopsy. No other breast specific complaints are verbalized.      PHYSICAL EXAM:   Wt Readings from Last 3 Encounters:  05/20/22 159 lb 3.2 oz (72.2 kg)  07/15/21 160 lb (72.6 kg)  02/18/21 167 lb 9.6 oz (76 kg)   Temp Readings from Last 3 Encounters:  07/15/21 (!) 97.2 F (36.2 C)  02/18/21 98.4 F (36.9 C) (Tympanic)  04/29/20 98.6 F (37 C)   BP Readings from Last 3 Encounters:  05/20/22 120/70  02/18/21 122/84  01/21/21 130/80   Pulse Readings from Last 3 Encounters:  05/20/22 60  02/18/21 60  01/21/21 72    In general this is a well appearing caucasian female in no acute distress. She's alert and oriented x4 and appropriate throughout the examination. Cardiopulmonary assessment is negative for acute distress and she exhibits normal effort. I chaperoned Dr. Ethlyn Gallery exam. She has mild bruising in the lower aspect of the central left breast. Dr. Marlou Starks palpated a single left axillary lymph node. No masses are noted in the right breast or axilla or supraclavicular nodes.     ECOG = 0  0 - Asymptomatic (Fully active, able to carry on all predisease activities without restriction)  1 - Symptomatic but completely ambulatory (Restricted in physically strenuous activity but ambulatory and able to carry out work of a light or sedentary nature. For example, light housework, office work)  2 - Symptomatic, <50% in bed during the day (Ambulatory and capable of all self care but unable to carry out any work activities. Up and about more than 50% of waking hours)  3 - Symptomatic, >50% in bed, but not bedbound (Capable of only limited self-care, confined to bed or chair 50% or more of waking hours)  4 - Bedbound (Completely disabled. Cannot carry on any self-care. Totally confined to bed or chair)  5 - Death   Eustace Pen MM, Creech RH, Tormey DC, et al. [phone]). "Toxicity and response criteria of the [facility]". [place] Oncol. 5 (6): 649-55    LABORATORY DATA:  Lab Results  Component Value Date   WBC 5.7 05/20/2022   HGB 14.9 05/20/2022    HCT 43.3 05/20/2022   MCV 93 05/20/2022   PLT 262 05/20/2022   Lab Results  Component Value Date   NA 133 (L) 05/20/2022   K 4.3 05/20/2022   CL 94 (L) 05/20/2022   CO2 24 05/20/2022   Lab Results  Component Value Date   ALT 22 05/20/2022   AST 21 05/20/2022   ALKPHOS 82 05/20/2022   BILITOT 0.6 05/20/2022      RADIOGRAPHY: No results found.     IMPRESSION/PLAN: 1. Stage IA, [id number], grade 1 invasive ductal  carcinoma with lobular features of the left breast. Dr. Lisbeth Renshaw discusses the pathology findings and reviews the nature of early stage breast disease. The consensus from the breast conference includes breast conservation with lumpectomy with sentinel node biopsy. Dr. Lindi Adie recommends adjuvant antiestrogen therapy but does not anticipate a role for chemotherapy. Dr. Lisbeth Renshaw recommends external radiotherapy to the breast  to reduce risks of local recurrence. We discussed the risks, benefits, short, and long term effects of radiotherapy, as well as the curative intent, and the patient is interested in proceeding. Dr. Lisbeth Renshaw discusses the delivery and logistics of radiotherapy and anticipates a course of 4 weeks of radiotherapy to the left breast with deep inspiration breath hold technqiue. We will see her back a few weeks after surgery to discuss the simulation process and anticipate we starting radiotherapy about 4-6 weeks after surgery.  2. Possible genetic predisposition to malignancy. The patient is a candidate for genetic testing given her personal and family history. She was will meet genetics today in clinic.  In a visit lasting 60 minutes, greater than 50% of the time was spent face to face reviewing her case, as well as in preparation of, discussing, and coordinating the patient's care but has had prior testing that was negative.   The above documentation reflects my direct findings during this shared patient visit. Please see the separate note by Dr. Lisbeth Renshaw on this date for the  remainder of the patient's plan of care.    Carola Rhine, [facility]    **Disclaimer: This note was dictated with voice recognition software. Similar sounding words can inadvertently be transcribed and this note may contain transcription errors which may not have been corrected upon publication of note.**

## 2022-06-16 NOTE — Assessment & Plan Note (Signed)
Screening mammogram detected left breast mass at 3 o'clock position 0.4 cm, axilla negative, ultrasound-guided biopsy revealed grade 1 IDC with lobular features with DCIS and calcifications ER 95% PR 80% HER2 negative, Ki-67 15%  Pathology and radiology counseling:Discussed with the patient, the details of pathology including the type of breast cancer,the clinical staging, the significance of ER, PR and HER-2/neu receptors and the implications for treatment. After reviewing the pathology in detail, we proceeded to discuss the different treatment options between surgery, radiation, chemotherapy, antiestrogen therapies.  Recommendations: 1. Breast conserving surgery followed by 2. Adjuvant radiation therapy followed by 3. Adjuvant antiestrogen therapy  Return to clinic after surgery to discuss final pathology report and then determine the adjuvant treatment plan.

## 2022-06-18 ENCOUNTER — Other Ambulatory Visit: Payer: Self-pay

## 2022-06-18 ENCOUNTER — Encounter (HOSPITAL_BASED_OUTPATIENT_CLINIC_OR_DEPARTMENT_OTHER): Payer: Self-pay | Admitting: General Surgery

## 2022-06-18 ENCOUNTER — Encounter: Payer: Self-pay | Admitting: *Deleted

## 2022-06-18 ENCOUNTER — Encounter (HOSPITAL_BASED_OUTPATIENT_CLINIC_OR_DEPARTMENT_OTHER)
Admission: RE | Admit: 2022-06-18 | Discharge: 2022-06-18 | Disposition: A | Discharge status: Home / Self Care | Payer: PPO | Source: Ambulatory Visit | Attending: General Surgery | Admitting: General Surgery

## 2022-06-18 DIAGNOSIS — C50512 Malignant neoplasm of lower-outer quadrant of left female breast: Secondary | ICD-10-CM

## 2022-06-18 DIAGNOSIS — Z01810 Encounter for preprocedural cardiovascular examination: Secondary | ICD-10-CM | POA: Diagnosis not present

## 2022-06-18 NOTE — Progress Notes (Signed)
       Patient Instructions  The night before surgery:  No food after midnight. ONLY clear liquids after midnight  The day of surgery (if you do NOT have diabetes):  Drink ONE (1) Pre-Surgery Clear Ensure as directed.   This drink was given to you during your hospital  pre-op appointment visit. The pre-op nurse will instruct you on the time to drink the  Pre-Surgery Ensure depending on your surgery time. Finish the drink at the designated time by the pre-op nurse.  Nothing else to drink after completing the  Pre-Surgery Clear Ensure.  The day of surgery (if you have diabetes): Drink ONE (1) Gatorade 2 (G2) as directed. This drink was given to you during your hospital  pre-op appointment visit.  The pre-op nurse will instruct you on the time to drink the   Gatorade 2 (G2) depending on your surgery time. Color of the Gatorade may vary. Red is not allowed. Nothing else to drink after completing the  Gatorade 2 (G2).         If you have questions, please contact your surgeon's office.Gave patient CHG soap with instructions, patient verbalized understanding.  

## 2022-06-20 NOTE — Progress Notes (Unsigned)
No chief complaint on file.  Patient was last seen in August for her physical.  She had a mammogram the day prior, no results had been received at time of her visit. She was called back for additional imaging, which apparently showed breast cancer. Per 9/6 Dr. Geralyn Flash note--she underwent ultrasound-guided biopsy of L breast mass which came back as grade 1 IDC with lobular features that was ER/PR positive HER2 negative.  Recommendations: 1. Breast conserving surgery followed by 2. Adjuvant radiation therapy followed by 3. Adjuvant antiestrogen therapy   Return to clinic after surgery to discuss final pathology report and then determine the adjuvant treatment plan.  She presents today to discuss elevated BP's.  At her physical, she reported BP's running 115-130/70's-80.  Max of 140/80's, and has a headache when it is that high (only if very stressed, infrequently, twice in 6 months).  She is compliant with taking losartan 48m daily and atenolol 276mBID.  BP Readings from Last 3 Encounters:  06/16/22 (!) 154/76  05/20/22 120/70  02/18/21 122/84      She denies headaches, dizziness, chest pain, palpitations, edema or side effects to medications.

## 2022-06-21 ENCOUNTER — Telehealth: Payer: Self-pay | Admitting: Emergency Medicine

## 2022-06-21 ENCOUNTER — Encounter: Payer: Self-pay | Admitting: Family Medicine

## 2022-06-21 ENCOUNTER — Ambulatory Visit (INDEPENDENT_AMBULATORY_CARE_PROVIDER_SITE_OTHER): Payer: PPO | Admitting: Family Medicine

## 2022-06-21 VITALS — BP 126/80 | HR 60 | Ht 64.0 in | Wt 159.4 lb

## 2022-06-21 DIAGNOSIS — C50512 Malignant neoplasm of lower-outer quadrant of left female breast: Secondary | ICD-10-CM

## 2022-06-21 DIAGNOSIS — Z23 Encounter for immunization: Secondary | ICD-10-CM | POA: Diagnosis not present

## 2022-06-21 DIAGNOSIS — M8580 Other specified disorders of bone density and structure, unspecified site: Secondary | ICD-10-CM

## 2022-06-21 DIAGNOSIS — I10 Essential (primary) hypertension: Secondary | ICD-10-CM | POA: Diagnosis not present

## 2022-06-21 DIAGNOSIS — Z170 Estrogen receptor positive status [ER+]: Secondary | ICD-10-CM

## 2022-06-21 NOTE — Telephone Encounter (Signed)
[employer] 2021-05 - Specimen Collection Study to Evaluate Biomarkers in Subjects with Cancer   Called patient to f/u on interest in study.  Pt agreed to consent/labs at 0945 tomorrow (06/22/22).  Appts created, orders put in.  Wells Guiles 'Learta CoddingNeysa Bonito, RN, BSN Clinical Research Nurse I 06/21/22 12:39 PM

## 2022-06-21 NOTE — Patient Instructions (Signed)
Okay to stop the aspirin for surgery as per surgeon's recommendations. You can address when the next bone density test should be done either with your GYN or the oncologists, once they determine which anti-estrogen medication is going to be used (if it further increases the risk of bone loss, or if it protects your bones).  We gave you your flu shot today. I recommend that you get the new RSV vaccine from the pharmacy in 2 weeks. I also recommend getting the updated COVID booster when it becomes available this Fall. All vaccines should be separated from each other by at least 2 weeks (including your tetanus booster).  Your blood pressure is excellent, and your home monitor is accurate.

## 2022-06-22 ENCOUNTER — Encounter: Payer: Self-pay | Admitting: Emergency Medicine

## 2022-06-22 ENCOUNTER — Inpatient Hospital Stay: Payer: PPO

## 2022-06-22 ENCOUNTER — Inpatient Hospital Stay: Payer: PPO | Admitting: Emergency Medicine

## 2022-06-22 ENCOUNTER — Other Ambulatory Visit: Payer: Self-pay | Admitting: Emergency Medicine

## 2022-06-22 ENCOUNTER — Other Ambulatory Visit: Payer: Self-pay

## 2022-06-22 DIAGNOSIS — Z170 Estrogen receptor positive status [ER+]: Secondary | ICD-10-CM

## 2022-06-22 DIAGNOSIS — C50912 Malignant neoplasm of unspecified site of left female breast: Secondary | ICD-10-CM | POA: Diagnosis not present

## 2022-06-22 LAB — RESEARCH LABS

## 2022-06-22 NOTE — Research (Signed)
[employer] 2021-05 - Specimen Collection Study to Evaluate Biomarkers in Subjects with [name]  Patient confirmed the following verbally today: - They are willing/able to comply with all study procedures. - They have not been/are not currently enrolled in another [employer] example collection study or another cohort within this study   Reviewed exclusion criteria for 'all subjects' as well as 'treatment naive cohort' with patient today who verbally denied all.  Confirmed with chart review as well.  Per MD Lindi Adie patient has no condition that would interfere with ability to fully participate in enrollment or protocol.  Eligibility: Eligibility criteria reviewed with patient. This Nurse has reviewed this patient's inclusion and exclusion criteria and confirmed patient is eligible for study participation. Eligibility confirmed by treating investigator, who also agrees that patient should proceed with enrollment. Patient will continue with enrollment.  Blood Collection: Research blood obtained by Fresh venipuncture per patient's preference. Patient tolerated well without any adverse events.  Gift Card: $50 gift card given to patient for her participation in this study.  Wells Guiles 'Learta CoddingNeysa Bonito, RN, BSN Clinical Research Nurse I 06/22/22 10:43 AM

## 2022-06-22 NOTE — Research (Signed)
[employer] 2021-05 - Specimen Collection Study to Evaluate Biomarkers in Subjects with Cancer    This Nurse has reviewed this patient's inclusion and exclusion criteria as a second review and confirms Carol Harris is eligible for study participation.  Patient may continue with enrollment.   Brion Aliment RN, BSN, CCRP Clinical Research Nurse Lead 06/22/2022 10:30 AM

## 2022-06-22 NOTE — Research (Signed)
[employer] 2021-05 - Specimen Collection Study to Evaluate Biomarkers in Subjects with Cancer   PATIENT HISTORY  Medical History:  High Blood Pressure  Yes Coronary Artery Disease No Lupus    No Rheumatoid Arthritis  No Diabetes   No      If yes, which type?      N/A Lynch Syndrome  No  Is the patient currently taking a magnesium supplement?   No If yes, dose and frequency? N/A Indication? N/A Start date? N/A  Does the patient have a personal history of cancer (greater than 5 years ago)?  No If yes, Cancer type and date of diagnosis?   N/A  Has this previous diagnosis been treated? N/A      If so, treatment type? N/A   Start and end dates of last treatment cycle? N/A  Does the patient have a family history of cancer in 1st or 2nd degree relatives? Yes If yes, Relationship(s) and Cancer type(s)? Brother (leukemia), mother (colon), mother (uterine), sister (uterine), and grandmother (uterine)  Does the patient have history of alcohol consumption? Yes   If yes, current or former? Current If former, year stopped? N/A Number of years? 46 Drinks per week? 1 per week or less  Does the patient have history of cigarette, cigar, pipe, or chewing tobacco use?  No  If yes, current for former? N/A If yes, type (Cigarette, cigar, pipe, and/or chewing tobacco)? N/A   If former, year stopped? N/A Number of years? N/A Packs/number/containers per day? N/A  Carol Harris '[place], RN, BSN Clinical Research Nurse I 06/22/22 10:46 AM

## 2022-06-22 NOTE — Research (Signed)
[employer] 2021-05 - Specimen Collection Study to Evaluate Biomarkers in Subjects with Cancer   CONSENT  Patient Carol Harris was identified by this Clinical Research Nurse as a potential candidate for the above listed study.  This Clinical Research Nurse met with Ed Sigrid Shober, [id number] on 06/22/22 in a manner and location that ensures patient privacy to discuss participation in the above listed research study.  Patient is Unaccompanied.  Patient was previously provided with informed consent documents.  Patient confirmed they have read the informed consent documents.  As outlined in the informed consent form, this Nurse and Kazaria Sigrid Bordeau discussed the purpose of the research study, the investigational nature of the study, study procedures and requirements for study participation, potential risks and benefits of study participation, as well as alternatives to participation.  This study is not blinded or double-blinded. The patient understands participation is voluntary and they may withdraw from study participation at any time.  This study does not involve randomization.  This study does not involve an investigational drug or device. This study does not involve a placebo. Patient understands enrollment is pending full eligibility review.   Confidentiality and how the patient's information will be used as part of study participation were discussed.  Patient was informed there is reimbursement provided for their time and effort spent on trial participation.  The patient is encouraged to discuss research study participation with their insurance provider to determine what costs they may incur as part of study participation, including research related injury.    All questions were answered to patient's satisfaction.  The informed consent with embedded HIPAA language was reviewed page by page.  The patient's mental and emotional status is appropriate to provide informed consent, and  the patient verbalizes an understanding of study participation.  Patient has agreed to participate in the above listed research study and has voluntarily signed the informed consent version date 10/24/20 (revised 11/09/21) with embedded HIPAA language, version date 10/24/20 (revised 11/09/21)  on 06/22/22 at 09:25 AM.  The patient was provided with a copy of the signed informed consent form with embedded HIPAA language for their reference.  No study specific procedures were obtained prior to the signing of the informed consent document.  Approximately 25 minutes were spent with the patient reviewing the informed consent documents.  Patient was not requested to complete a Release of Information form.  Rebecca 'Liza' Segal, RN, BSN Clinical Research Nurse I 06/22/22 10:41 AM   

## 2022-06-24 ENCOUNTER — Encounter: Payer: Self-pay | Admitting: *Deleted

## 2022-06-24 ENCOUNTER — Ambulatory Visit
Admission: RE | Admit: 2022-06-24 | Discharge: 2022-06-24 | Disposition: A | Discharge status: Home / Self Care | Payer: Self-pay | Source: Ambulatory Visit | Attending: Radiation Oncology | Admitting: Radiation Oncology

## 2022-06-24 ENCOUNTER — Telehealth: Payer: Self-pay | Admitting: *Deleted

## 2022-06-24 ENCOUNTER — Inpatient Hospital Stay
Admission: RE | Admit: 2022-06-24 | Discharge: 2022-06-24 | Disposition: A | Discharge status: Home / Self Care | Payer: Self-pay | Source: Ambulatory Visit | Attending: Radiation Oncology | Admitting: Radiation Oncology

## 2022-06-24 ENCOUNTER — Other Ambulatory Visit: Payer: Self-pay | Admitting: Radiation Oncology

## 2022-06-24 DIAGNOSIS — C50512 Malignant neoplasm of lower-outer quadrant of left female breast: Secondary | ICD-10-CM

## 2022-06-24 DIAGNOSIS — N6323 Unspecified lump in the left breast, lower outer quadrant: Secondary | ICD-10-CM | POA: Diagnosis not present

## 2022-06-24 NOTE — Telephone Encounter (Signed)
Spoke with patient to follow up from [facility] 9/6 and assess navigation needs.  Patient denies any questions or concerns at this time. Encouraged her to call should anything arise. Patient verbalized understanding.

## 2022-06-25 ENCOUNTER — Ambulatory Visit (HOSPITAL_BASED_OUTPATIENT_CLINIC_OR_DEPARTMENT_OTHER): Payer: PPO | Admitting: Anesthesiology

## 2022-06-25 ENCOUNTER — Ambulatory Visit (HOSPITAL_BASED_OUTPATIENT_CLINIC_OR_DEPARTMENT_OTHER)
Admission: RE | Admit: 2022-06-25 | Discharge: 2022-06-25 | Disposition: A | Discharge status: Home / Self Care | Payer: PPO | Source: Ambulatory Visit | Attending: General Surgery | Admitting: General Surgery

## 2022-06-25 ENCOUNTER — Encounter (HOSPITAL_BASED_OUTPATIENT_CLINIC_OR_DEPARTMENT_OTHER)
Admission: RE | Disposition: A | Discharge status: Home / Self Care | Payer: Self-pay | Source: Ambulatory Visit | Attending: General Surgery

## 2022-06-25 ENCOUNTER — Encounter (HOSPITAL_BASED_OUTPATIENT_CLINIC_OR_DEPARTMENT_OTHER): Payer: Self-pay | Admitting: General Surgery

## 2022-06-25 ENCOUNTER — Other Ambulatory Visit: Payer: Self-pay

## 2022-06-25 DIAGNOSIS — G4730 Sleep apnea, unspecified: Secondary | ICD-10-CM | POA: Insufficient documentation

## 2022-06-25 DIAGNOSIS — C50412 Malignant neoplasm of upper-outer quadrant of left female breast: Secondary | ICD-10-CM | POA: Insufficient documentation

## 2022-06-25 DIAGNOSIS — G4733 Obstructive sleep apnea (adult) (pediatric): Secondary | ICD-10-CM

## 2022-06-25 DIAGNOSIS — I10 Essential (primary) hypertension: Secondary | ICD-10-CM | POA: Insufficient documentation

## 2022-06-25 DIAGNOSIS — K219 Gastro-esophageal reflux disease without esophagitis: Secondary | ICD-10-CM | POA: Diagnosis not present

## 2022-06-25 DIAGNOSIS — Z8673 Personal history of transient ischemic attack (TIA), and cerebral infarction without residual deficits: Secondary | ICD-10-CM | POA: Insufficient documentation

## 2022-06-25 DIAGNOSIS — F418 Other specified anxiety disorders: Secondary | ICD-10-CM | POA: Diagnosis not present

## 2022-06-25 DIAGNOSIS — Z170 Estrogen receptor positive status [ER+]: Secondary | ICD-10-CM | POA: Insufficient documentation

## 2022-06-25 DIAGNOSIS — G8918 Other acute postprocedural pain: Secondary | ICD-10-CM | POA: Diagnosis not present

## 2022-06-25 DIAGNOSIS — N6032 Fibrosclerosis of left breast: Secondary | ICD-10-CM | POA: Diagnosis not present

## 2022-06-25 DIAGNOSIS — C50912 Malignant neoplasm of unspecified site of left female breast: Secondary | ICD-10-CM

## 2022-06-25 HISTORY — PX: BREAST LUMPECTOMY WITH RADIOACTIVE SEED AND SENTINEL LYMPH NODE BIOPSY: SHX6550

## 2022-06-25 HISTORY — DX: Sleep apnea, unspecified: G47.30

## 2022-06-25 SURGERY — BREAST LUMPECTOMY WITH RADIOACTIVE SEED AND SENTINEL LYMPH NODE BIOPSY
Anesthesia: General | Site: Breast | Laterality: Left

## 2022-06-25 MED ORDER — CHLORHEXIDINE GLUCONATE CLOTH 2 % EX PADS: 6.0000 | MEDICATED_PAD | Freq: Once | CUTANEOUS | Status: DC

## 2022-06-25 MED ORDER — CELECOXIB 200 MG PO CAPS
200.0000 mg | ORAL_CAPSULE | ORAL | Status: AC
  Administered 2022-06-25: 200 mg via ORAL

## 2022-06-25 MED ORDER — ACETAMINOPHEN 500 MG PO TABS
ORAL_TABLET | ORAL | Status: AC
  Filled 2022-06-25: qty 2

## 2022-06-25 MED ORDER — FENTANYL CITRATE (PF) 100 MCG/2ML IJ SOLN
100.0000 ug | Freq: Once | INTRAMUSCULAR | Status: AC
  Administered 2022-06-25: 100 ug via INTRAVENOUS

## 2022-06-25 MED ORDER — ACETAMINOPHEN 500 MG PO TABS
1000.0000 mg | ORAL_TABLET | ORAL | Status: AC
  Administered 2022-06-25: 1000 mg via ORAL

## 2022-06-25 MED ORDER — FENTANYL CITRATE (PF) 100 MCG/2ML IJ SOLN
INTRAMUSCULAR | Status: AC
  Filled 2022-06-25: qty 2

## 2022-06-25 MED ORDER — FENTANYL CITRATE (PF) 100 MCG/2ML IJ SOLN
INTRAMUSCULAR | Status: DC | PRN
  Administered 2022-06-25 (×2): 25 ug via INTRAVENOUS
  Administered 2022-06-25: 50 ug via INTRAVENOUS

## 2022-06-25 MED ORDER — BUPIVACAINE-EPINEPHRINE (PF) 0.5% -1:200000 IJ SOLN
INTRAMUSCULAR | Status: DC | PRN
  Administered 2022-06-25: 30 mL

## 2022-06-25 MED ORDER — EPHEDRINE 5 MG/ML INJ
INTRAVENOUS | Status: AC
  Filled 2022-06-25: qty 5

## 2022-06-25 MED ORDER — 0.9 % SODIUM CHLORIDE (POUR BTL) OPTIME
TOPICAL | Status: DC | PRN
  Administered 2022-06-25: 75 mL

## 2022-06-25 MED ORDER — CEFAZOLIN SODIUM-DEXTROSE 2-4 GM/100ML-% IV SOLN
2.0000 g | INTRAVENOUS | Status: AC
  Administered 2022-06-25: 2 g via INTRAVENOUS

## 2022-06-25 MED ORDER — ONDANSETRON HCL 4 MG/2ML IJ SOLN
INTRAMUSCULAR | Status: DC | PRN
  Administered 2022-06-25: 4 mg via INTRAVENOUS

## 2022-06-25 MED ORDER — LIDOCAINE HCL (CARDIAC) PF 100 MG/5ML IV SOSY
PREFILLED_SYRINGE | INTRAVENOUS | Status: DC | PRN
  Administered 2022-06-25: 50 mg via INTRAVENOUS

## 2022-06-25 MED ORDER — MEPERIDINE HCL 25 MG/ML IJ SOLN: 6.2500 mg | INTRAMUSCULAR | Status: DC | PRN

## 2022-06-25 MED ORDER — EPHEDRINE SULFATE (PRESSORS) 50 MG/ML IJ SOLN
INTRAMUSCULAR | Status: DC | PRN
  Administered 2022-06-25 (×5): 10 mg via INTRAVENOUS

## 2022-06-25 MED ORDER — PROPOFOL 10 MG/ML IV BOLUS
INTRAVENOUS | Status: AC
  Filled 2022-06-25: qty 20

## 2022-06-25 MED ORDER — OXYCODONE HCL 5 MG PO TABS: 5.0000 mg | ORAL_TABLET | Freq: Four times a day (QID) | ORAL | 0 refills | Status: DC | PRN

## 2022-06-25 MED ORDER — GABAPENTIN 300 MG PO CAPS
300.0000 mg | ORAL_CAPSULE | ORAL | Status: AC
  Administered 2022-06-25: 300 mg via ORAL

## 2022-06-25 MED ORDER — PHENYLEPHRINE HCL (PRESSORS) 10 MG/ML IV SOLN
INTRAVENOUS | Status: DC | PRN
  Administered 2022-06-25: 80 ug via INTRAVENOUS
  Administered 2022-06-25 (×2): 160 ug via INTRAVENOUS
  Administered 2022-06-25 (×2): 80 ug via INTRAVENOUS

## 2022-06-25 MED ORDER — PROPOFOL 10 MG/ML IV BOLUS
INTRAVENOUS | Status: DC | PRN
  Administered 2022-06-25: 150 mg via INTRAVENOUS

## 2022-06-25 MED ORDER — OXYCODONE HCL 5 MG/5ML PO SOLN: 5.0000 mg | Freq: Once | ORAL | Status: DC | PRN

## 2022-06-25 MED ORDER — LACTATED RINGERS IV SOLN: INTRAVENOUS | Status: DC

## 2022-06-25 MED ORDER — MAGTRACE LYMPHATIC TRACER
INTRAMUSCULAR | Status: DC | PRN
  Administered 2022-06-25: 2 mL via INTRAMUSCULAR

## 2022-06-25 MED ORDER — CEFAZOLIN SODIUM-DEXTROSE 2-4 GM/100ML-% IV SOLN
INTRAVENOUS | Status: AC
  Filled 2022-06-25: qty 100

## 2022-06-25 MED ORDER — CELECOXIB 200 MG PO CAPS
ORAL_CAPSULE | ORAL | Status: AC
  Filled 2022-06-25: qty 1

## 2022-06-25 MED ORDER — MIDAZOLAM HCL 2 MG/2ML IJ SOLN
INTRAMUSCULAR | Status: AC
  Filled 2022-06-25: qty 2

## 2022-06-25 MED ORDER — HYDROMORPHONE HCL 1 MG/ML IJ SOLN: 0.2500 mg | INTRAMUSCULAR | Status: DC | PRN

## 2022-06-25 MED ORDER — LIDOCAINE 2% (20 MG/ML) 5 ML SYRINGE
INTRAMUSCULAR | Status: AC
  Filled 2022-06-25: qty 5

## 2022-06-25 MED ORDER — MIDAZOLAM HCL 2 MG/2ML IJ SOLN: 0.5000 mg | Freq: Once | INTRAMUSCULAR | Status: DC | PRN

## 2022-06-25 MED ORDER — ACETAMINOPHEN 500 MG PO TABS: 1000.0000 mg | ORAL_TABLET | Freq: Once | ORAL | Status: AC

## 2022-06-25 MED ORDER — PROMETHAZINE HCL 25 MG/ML IJ SOLN: 6.2500 mg | INTRAMUSCULAR | Status: DC | PRN

## 2022-06-25 MED ORDER — DEXAMETHASONE SODIUM PHOSPHATE 4 MG/ML IJ SOLN
INTRAMUSCULAR | Status: DC | PRN
  Administered 2022-06-25: 5 mg via INTRAVENOUS

## 2022-06-25 MED ORDER — OXYCODONE HCL 5 MG PO TABS: 5.0000 mg | ORAL_TABLET | Freq: Once | ORAL | Status: DC | PRN

## 2022-06-25 MED ORDER — ONDANSETRON HCL 4 MG/2ML IJ SOLN
INTRAMUSCULAR | Status: AC
  Filled 2022-06-25: qty 2

## 2022-06-25 MED ORDER — GABAPENTIN 300 MG PO CAPS
ORAL_CAPSULE | ORAL | Status: AC
  Filled 2022-06-25: qty 1

## 2022-06-25 MED ORDER — MIDAZOLAM HCL 5 MG/5ML IJ SOLN
INTRAMUSCULAR | Status: DC | PRN
  Administered 2022-06-25: 1 mg via INTRAVENOUS

## 2022-06-25 MED ORDER — DEXAMETHASONE SODIUM PHOSPHATE 10 MG/ML IJ SOLN
INTRAMUSCULAR | Status: AC
  Filled 2022-06-25: qty 1

## 2022-06-25 MED ORDER — BUPIVACAINE-EPINEPHRINE 0.25% -1:200000 IJ SOLN
INTRAMUSCULAR | Status: DC | PRN
  Administered 2022-06-25: 18 mL

## 2022-06-25 SURGICAL SUPPLY — 44 items
ADH SKN CLS APL DERMABOND .7 (GAUZE/BANDAGES/DRESSINGS) ×1
APL PRP STRL LF DISP 70% ISPRP (MISCELLANEOUS) ×1
APPLIER CLIP 9.375 MED OPEN (MISCELLANEOUS) ×1
APR CLP MED 9.3 20 MLT OPN (MISCELLANEOUS) ×1
BLADE SURG 15 STRL LF DISP TIS (BLADE) ×1 IMPLANT
BLADE SURG 15 STRL SS (BLADE) ×1
CANISTER SUC SOCK COL 7IN (MISCELLANEOUS) IMPLANT
CANISTER SUCT 1200ML W/VALVE (MISCELLANEOUS) IMPLANT
CHLORAPREP W/TINT 26 (MISCELLANEOUS) ×1 IMPLANT
CLIP APPLIE 9.375 MED OPEN (MISCELLANEOUS) ×1 IMPLANT
COVER BACK TABLE 60X90IN (DRAPES) ×1 IMPLANT
COVER MAYO STAND STRL (DRAPES) ×1 IMPLANT
COVER PROBE W GEL 5X96 (DRAPES) ×1 IMPLANT
DERMABOND ADVANCED .7 DNX12 (GAUZE/BANDAGES/DRESSINGS) ×1 IMPLANT
DRAPE LAPAROSCOPIC ABDOMINAL (DRAPES) ×1 IMPLANT
DRAPE UTILITY XL STRL (DRAPES) ×1 IMPLANT
ELECT COATED BLADE 2.86 ST (ELECTRODE) ×1 IMPLANT
ELECT REM PT RETURN 9FT ADLT (ELECTROSURGICAL) ×1
ELECTRODE REM PT RTRN 9FT ADLT (ELECTROSURGICAL) ×1 IMPLANT
GLOVE BIO SURGEON STRL SZ7.5 (GLOVE) ×1 IMPLANT
GLOVE BIOGEL PI IND STRL 7.0 (GLOVE) IMPLANT
GOWN STRL REUS W/ TWL LRG LVL3 (GOWN DISPOSABLE) ×2 IMPLANT
GOWN STRL REUS W/TWL LRG LVL3 (GOWN DISPOSABLE) ×3
ILLUMINATOR WAVEGUIDE N/F (MISCELLANEOUS) IMPLANT
KIT MARKER MARGIN INK (KITS) ×1 IMPLANT
LIGHT WAVEGUIDE WIDE FLAT (MISCELLANEOUS) IMPLANT
NDL HYPO 25X1 1.5 SAFETY (NEEDLE) ×1 IMPLANT
NDL SAFETY ECLIP 18X1.5 (MISCELLANEOUS) IMPLANT
NEEDLE HYPO 25X1 1.5 SAFETY (NEEDLE) ×1 IMPLANT
NS IRRIG 1000ML POUR BTL (IV SOLUTION) IMPLANT
PACK BASIN DAY SURGERY FS (CUSTOM PROCEDURE TRAY) ×1 IMPLANT
PENCIL SMOKE EVACUATOR (MISCELLANEOUS) ×1 IMPLANT
SLEEVE SCD COMPRESS KNEE MED (STOCKING) ×1 IMPLANT
SPIKE FLUID TRANSFER (MISCELLANEOUS) IMPLANT
SPONGE T-LAP 18X18 ~~LOC~~+RFID (SPONGE) ×1 IMPLANT
SUT MON AB 4-0 PC3 18 (SUTURE) ×2 IMPLANT
SUT SILK 2 0 SH (SUTURE) IMPLANT
SUT VICRYL 3-0 CR8 SH (SUTURE) ×1 IMPLANT
SYR CONTROL 10ML LL (SYRINGE) ×1 IMPLANT
TOWEL GREEN STERILE FF (TOWEL DISPOSABLE) ×1 IMPLANT
TRACER MAGTRACE VIAL (MISCELLANEOUS) IMPLANT
TRAY FAXITRON CT DISP (TRAY / TRAY PROCEDURE) ×1 IMPLANT
TUBE CONNECTING 20X1/4 (TUBING) IMPLANT
YANKAUER SUCT BULB TIP NO VENT (SUCTIONS) IMPLANT

## 2022-06-25 NOTE — Op Note (Signed)
06/25/2022  2:27 PM  PATIENT:  Carol Harris  66 y.o. female  PRE-OPERATIVE DIAGNOSIS:  LEFT BREAST CANCER  POST-OPERATIVE DIAGNOSIS:  LEFT BREAST CANCER  PROCEDURE:  Procedure(s): LEFT BREAST LUMPECTOMY WITH RADIOACTIVE SEED LOCALIZATION AND DEEP LEFT AXILLARY SENTINEL LYMPH NODE BIOPSY (Left)  SURGEON:  Surgeon(s) and Role:    * Jovita Kussmaul, MD - Primary  PHYSICIAN ASSISTANT:   ASSISTANTS: none   ANESTHESIA:   local and general  EBL:  25 mL   BLOOD ADMINISTERED:none  DRAINS: none   LOCAL MEDICATIONS USED:  MARCAINE     SPECIMEN:  Source of Specimen:  left breast tissue with additional lateral and deep margin and sentinel nodes x 2  DISPOSITION OF SPECIMEN:  PATHOLOGY  COUNTS:  YES  TOURNIQUET:  * No tourniquets in log *  DICTATION: .Dragon Dictation  After informed consent was obtained the patient was brought to the operating room and placed in the supine position on the operating table.  After adequate induction of general anesthesia the patient's left chest, breast, and axillary area were prepped with ChloraPrep, allowed to dry, and draped in usual sterile manner.  An appropriate timeout was performed.  Previously an I-125 seed was placed in the lateral aspect of the left breast to mark an area of invasive breast cancer.  At this point, 2 cc of iron oxide were injected into the subareolar plexus of the left breast and the breast was massaged for several minutes.  Attention was first turned to the left breast itself.  The neoprobe was set to I-125 in the area of radioactivity was readily identified.  The area around this was infiltrated with quarter percent Marcaine.  A curvilinear incision was made along the outer edge of the areola of the left breast with a 15 blade knife.  The incision was carried through the skin and subcutaneous tissue sharply with the electrocautery.  Dissection was then carried towards the radioactive seed under the direction of the neoprobe.   Once I more closely approached the radioactive seed I then removed a circular portion of breast tissue sharply with the electrocautery around the radioactive seed while checking the area of radioactivity frequently.  Once the specimen was removed it was oriented with the appropriate paint colors.  A specimen radiograph was obtained that showed the clip and seed to be within the specimen.  It did look a little close to the lateral margin so I did take an additional lateral and deep margin.  This tissue was also marked appropriately and sent to pathology for further evaluation.  Hemostasis was achieved using the Bovie electrocautery.  The wound was irrigated with saline and infiltrated with more quarter percent Marcaine.  The deep layer of the incision was then closed with layers of interrupted 3-0 Vicryl stitches.  The skin was closed with interrupted 4-0 Monocryl subcuticular stitches.  Attention was then turned to the left axilla.  The Sentimag was used to identify a signal in the left axilla.  The area overlying this was infiltrated with quarter percent Marcaine.  A small transversely oriented incision was made with a 15 blade knife overlying the signal.  The incision was carried through the skin and subcutaneous tissue sharply with the electrocautery until the deep left axillary space was entered.  The Sentimag was used to identify a lymph node with signal.  This lymph node was excised sharply with the electrocautery and the surrounding small vessels and lymphatics were controlled with clips.  There was an  additional palpable node in the axilla as well that we also excised sharply with the electrocautery and the surrounding small vessels and lymphatics were controlled with clips.  These were sent as sentinel nodes numbers 1 and 2.  No other hot or palpable nodes were identified in the left axilla.  Hemostasis was achieved using the Bovie electrocautery.  The deep layer of the incision was then closed with  interrupted 3-0 Vicryl stitches.  The skin was then closed with a running 4-0 Monocryl subcuticular stitch.  Dermabond dressings were applied.  The patient tolerated the procedure well.  At the end of the case all needle sponge and instrument counts were correct.  The patient was then awakened and taken to recovery in stable condition.  PLAN OF CARE: Discharge to home after PACU  PATIENT DISPOSITION:  PACU - hemodynamically stable.   Delay start of Pharmacological VTE agent (>24hrs) due to surgical blood loss or risk of bleeding: not applicable

## 2022-06-25 NOTE — Anesthesia Procedure Notes (Signed)
Anesthesia Regional Block: Pectoralis block   Pre-Anesthetic Checklist: , timeout performed,  Correct Patient, Correct Site, Correct Laterality,  Correct Procedure, Correct Position, site marked,  Risks and benefits discussed,  Surgical consent,  Pre-op evaluation,  At surgeon's request and post-op pain management  Laterality: Left  Prep: chloraprep       Needles:  Injection technique: Single-shot  Needle Type: Echogenic Needle     Needle Length: 9cm  Needle Gauge: 21     Additional Needles:   Procedures:,,,, ultrasound used (permanent image in chart),,    Narrative:  Start time: 06/25/2022 11:42 AM End time: 06/25/2022 11:49 AM Injection made incrementally with aspirations every 5 mL.  Performed by: Personally  Anesthesiologist: Annye Asa, MD  Additional Notes: Pt identified in Holding room.  Monitors applied. Working IV access confirmed. Sterile prep L clavicle and pec.  #21ga ECHOgenic Arrow block needle rib 4, between pec minor and serratus, and then pec major and pec minor.  Total 30cc 0.5% Bupivacaine 1:200k epi injected incrementally after negative test dose, good planar spread of local anesthetic.  Patient asymptomatic, VSS, no heme aspirated, tolerated well. Jenita Seashore, MD

## 2022-06-25 NOTE — Transfer of Care (Signed)
Immediate Anesthesia Transfer of Care Note  Patient: Carol Harris  Procedure(s) Performed: LEFT BREAST LUMPECTOMY WITH RADIOACTIVE SEED AND SENTINEL LYMPH NODE BIOPSY (Left: Breast)  Patient Location: PACU  Anesthesia Type:General  Level of Consciousness: awake, alert  and oriented  Airway & Oxygen Therapy: Patient Spontanous Breathing and Patient connected to nasal cannula oxygen  Post-op Assessment: Report given to RN and Post -op Vital signs reviewed and stable  Post vital signs: Reviewed and stable  Last Vitals:  Vitals Value Taken Time  BP 130/76 06/25/22 1439  Temp    Pulse 75 06/25/22 1440  Resp 15 06/25/22 1440  SpO2 98 % 06/25/22 1440  Vitals shown include unvalidated device data.  Last Pain:  Vitals:   06/25/22 1123  TempSrc: Oral  PainSc: 0-No pain         Complications: No notable events documented.

## 2022-06-25 NOTE — Anesthesia Postprocedure Evaluation (Signed)
Anesthesia Post Note  Patient: Carol Harris  Procedure(s) Performed: LEFT BREAST LUMPECTOMY WITH RADIOACTIVE SEED AND SENTINEL LYMPH NODE BIOPSY (Left: Breast)     Patient location during evaluation: Phase II Anesthesia Type: General Level of consciousness: awake and alert, patient cooperative and oriented Pain management: pain level controlled Vital Signs Assessment: post-procedure vital signs reviewed and stable Respiratory status: spontaneous breathing, nonlabored ventilation and respiratory function stable Cardiovascular status: blood pressure returned to baseline and stable Postop Assessment: no apparent nausea or vomiting, adequate PO intake and able to ambulate Anesthetic complications: no   No notable events documented.  Last Vitals:  Vitals:   06/25/22 1455 06/25/22 1531  BP: 133/77 (!) 146/88  Pulse:  76  Resp: 16 17  Temp:  36.4 C  SpO2: 97% 99%    Last Pain:  Vitals:   06/25/22 1531  TempSrc:   PainSc: 0-No pain                 Reiss Mowrey,E. Taneesha Edgin

## 2022-06-25 NOTE — Anesthesia Preprocedure Evaluation (Addendum)
Anesthesia Evaluation  Patient identified by MRN, date of birth, ID band Patient awake    Reviewed: Allergy & Precautions, NPO status , Patient's Chart, lab work & pertinent test results, reviewed documented beta blocker date and time   History of Anesthesia Complications Negative for: history of anesthetic complications  Airway Mallampati: II  TM Distance: >3 FB Neck ROM: Full    Dental  (+) Dental Advisory Given, Teeth Intact   Pulmonary sleep apnea (does not use CPAP) ,    breath sounds clear to auscultation       Cardiovascular hypertension, Pt. on home beta blockers and Pt. on medications (-) angina Rhythm:Regular Rate:Normal  '16 ECHO: EF 60-65%, normal LVF, normal RVF, no significant valvular abnormalities '15 Stress: normal, without ischemia   Neuro/Psych Anxiety Depression CVA, No Residual Symptoms    GI/Hepatic Neg liver ROS, GERD  Controlled,  Endo/Other  negative endocrine ROS  Renal/GU negative Renal ROS     Musculoskeletal   Abdominal   Peds  Hematology negative hematology ROS (+)   Anesthesia Other Findings Breast cancer  Reproductive/Obstetrics                            Anesthesia Physical Anesthesia Plan  ASA: 3  Anesthesia Plan: General   Post-op Pain Management: Tylenol PO (pre-op)* and Regional block*   Induction: Intravenous  PONV Risk Score and Plan: 3 and Ondansetron, Dexamethasone and Treatment may vary due to age or medical condition  Airway Management Planned: LMA  Additional Equipment: None  Intra-op Plan:   Post-operative Plan:   Informed Consent: I have reviewed the patients History and Physical, chart, labs and discussed the procedure including the risks, benefits and alternatives for the proposed anesthesia with the patient or authorized representative who has indicated his/her understanding and acceptance.     Dental advisory given  Plan  Discussed with: CRNA and Surgeon  Anesthesia Plan Comments: (Plan routine monitors, GA with pectoralis block for post op analgesia)       Anesthesia Quick Evaluation

## 2022-06-25 NOTE — Progress Notes (Signed)
Assisted Dr. Carswell Jackson with left, pectoralis, ultrasound guided block. Side rails up, monitors on throughout procedure. See vital signs in flow sheet. Tolerated Procedure well. 

## 2022-06-25 NOTE — H&P (Signed)
MRN: [id number] DOB: [birth year] Subjective   Chief Complaint: Breast Cancer   History of Present Illness: Carol Harris is a 66 y.o. female who is seen today as an office consultation for evaluation of Breast Cancer .   We are asked to see the patient in consultation by Dr. Lindi Adie to evaluate her for a new left breast cancer. The patient is a 66 year old white female who recently went for a routine screening mammogram. At that time she was found to have a 4 mm mass in the lateral aspect of the left breast. The lymph nodes looked normal. The mass was biopsied and came back as a grade 1 invasive ductal cancer that was ER and PR positive and HER2 negative with a Ki-67 of 15%. She has no history of breast cancer. She is otherwise in good health and does not smoke.  Review of Systems: A complete review of systems was obtained from the patient. I have reviewed this information and discussed as appropriate with the patient. See HPI as well for other ROS.  ROS   Medical History: Past Medical History:  Diagnosis Date  Anxiety  History of cancer  Hyperlipidemia  Hypertension  Sleep apnea   Patient Active Problem List  Diagnosis  HTN (hypertension), benign  Hyperlipidemia, mixed  Malignant neoplasm of lower-outer quadrant of left breast of female, estrogen receptor positive (CMS-HCC)  Malignant neoplasm of upper-outer quadrant of left breast in female, estrogen receptor positive (CMS-HCC)   Past Surgical History:  Procedure Laterality Date  ARTHROSCOPY KNEE Right  2005 approximately  LAPAROSCOPIC TUBAL LIGATION N/A  Date Unknown  TRANSCERVICAL UTERINE FIBROID(S) ABLATION N/A  Date Unknown    No Known Allergies  Current Outpatient Medications on File Prior to Visit  Medication Sig Dispense Refill  atenoloL (TENORMIN) 25 MG tablet Take 1 tablet by mouth 2 (two) times daily  atorvastatin (LIPITOR) 40 MG tablet Take 40 mg by mouth once daily  buPROPion (WELLBUTRIN XL) 150 MG XL  tablet TAKE 3 TABLETS EVERY MORNING  losartan (COZAAR) 50 MG tablet Take 1 tablet by mouth once daily  aspirin 81 MG EC tablet Take 81 mg by mouth once daily  cholecalciferol, vitamin D3, (VITAMIN D3) 125 mcg (5,000 unit) tablet Take by mouth  cyanocobalamin (VITAMIN B12) 100 MCG tablet Take by mouth   No current facility-administered medications on file prior to visit.   Family History  Problem Relation Age of Onset  Obesity Mother  Colon cancer Mother  Stroke Father  Skin cancer Father  High blood pressure (Hypertension) Sister  Hyperlipidemia (Elevated cholesterol) Sister  Uterine cancer Sister    Social History   Tobacco Use  Smoking Status Never  Smokeless Tobacco Never    Social History   Socioeconomic History  Marital status: Single  Tobacco Use  Smoking status: Never  Smokeless tobacco: Never  Vaping Use  Vaping Use: Never used  Substance and Sexual Activity  Alcohol use: Yes  Comment: "social"  Drug use: Never   Objective:  There were no vitals filed for this visit.  There is no height or weight on file to calculate BMI.  Physical Exam Vitals reviewed.  Constitutional:  General: She is not in acute distress. Appearance: Normal appearance.  HENT:  Head: Normocephalic and atraumatic.  Right Ear: External ear normal.  Left Ear: External ear normal.  Nose: Nose normal.  Mouth/Throat:  Mouth: Mucous membranes are moist.  Pharynx: Oropharynx is clear.  Eyes:  General: No scleral icterus. Extraocular Movements: Extraocular movements intact.  Conjunctiva/sclera: Conjunctivae normal.  Pupils: Pupils are equal, round, and reactive to light.  Cardiovascular:  Rate and Rhythm: Normal rate and regular rhythm.  Pulses: Normal pulses.  Heart sounds: Normal heart sounds.  Pulmonary:  Effort: Pulmonary effort is normal. No respiratory distress.  Breath sounds: Normal breath sounds.  Abdominal:  General: Bowel sounds are normal.  Palpations: Abdomen is  soft.  Tenderness: There is no abdominal tenderness.  Musculoskeletal:  General: No swelling, tenderness or deformity. Normal range of motion.  Cervical back: Normal range of motion and neck supple.  Skin: General: Skin is warm and dry.  Coloration: Skin is not jaundiced.  Neurological:  General: No focal deficit present.  Mental Status: She is alert and oriented to person, place, and time.  Psychiatric:  Mood and Affect: Mood normal.  Behavior: Behavior normal.    Breast: There is no palpable mass in either breast. There is no palpable axillary, supraclavicular, or cervical lymphadenopathy.  Labs, Imaging and Diagnostic Testing:  Assessment and Plan:   Diagnoses and all orders for this visit:  Malignant neoplasm of upper-outer quadrant of left breast in female, estrogen receptor positive (CMS-HCC) - Ambulatory Referral to Oncology-Medical - Ambulatory Referral to Radiation Oncology - Ambulatory Referral to Physical Therapy - [facility] Case Posting Request; Future    The patient appears to have a 4 mm cancer in the outer aspect of the left breast with all favorable markers and clinically negative nodes. I have discussed with her in detail the different options for treatment and at this point she favors breast conservation. She will be a good candidate for sentinel node biopsy as well. I have discussed with her in detail the risks and benefits of the operation as well as some of the technical aspects including the use of a radioactive seed for localization and she understands and wishes to proceed. She will meet with medical and radiation oncology to discuss adjuvant therapy. She will also meet with physical therapy for preoperative lymphedema testing. We will begin surgical planning.

## 2022-06-25 NOTE — Interval H&P Note (Signed)
History and Physical Interval Note:  06/25/2022 12:36 PM  Carol Harris  has presented today for surgery, with the diagnosis of LEFT BREAST CANCER.  The various methods of treatment have been discussed with the patient and family. After consideration of risks, benefits and other options for treatment, the patient has consented to  Procedure(s): LEFT BREAST LUMPECTOMY WITH RADIOACTIVE SEED AND SENTINEL LYMPH NODE BIOPSY (Left) as a surgical intervention.  The patient's history has been reviewed, patient examined, no change in status, stable for surgery.  I have reviewed the patient's chart and labs.  Questions were answered to the patient's satisfaction.     Autumn Messing III

## 2022-06-25 NOTE — Anesthesia Procedure Notes (Signed)
Procedure Name: LMA Insertion Date/Time: 06/25/2022 1:07 PM  Performed by: Bufford Spikes, CRNAPre-anesthesia Checklist: Patient identified, Emergency Drugs available, Suction available and Patient being monitored Patient Re-evaluated:Patient Re-evaluated prior to induction Oxygen Delivery Method: Circle system utilized Preoxygenation: Pre-oxygenation with 100% oxygen Induction Type: IV induction Ventilation: Mask ventilation without difficulty LMA: LMA inserted LMA Size: 4.0 Number of attempts: 1 Placement Confirmation: positive ETCO2 Tube secured with: Tape Dental Injury: Teeth and Oropharynx as per pre-operative assessment

## 2022-06-28 ENCOUNTER — Encounter (HOSPITAL_BASED_OUTPATIENT_CLINIC_OR_DEPARTMENT_OTHER): Payer: Self-pay | Admitting: General Surgery

## 2022-07-01 ENCOUNTER — Encounter: Payer: Self-pay | Admitting: *Deleted

## 2022-07-01 LAB — SURGICAL PATHOLOGY

## 2022-07-05 ENCOUNTER — Encounter: Payer: Self-pay | Admitting: *Deleted

## 2022-07-05 ENCOUNTER — Telehealth: Payer: Self-pay | Admitting: *Deleted

## 2022-07-05 NOTE — Telephone Encounter (Signed)
Ordered oncotype per Dr. Gudena.  Requisition sent to pathology and exact sciences. 

## 2022-07-06 ENCOUNTER — Ambulatory Visit: Payer: PPO | Admitting: Hematology and Oncology

## 2022-07-14 DIAGNOSIS — C50512 Malignant neoplasm of lower-outer quadrant of left female breast: Secondary | ICD-10-CM | POA: Diagnosis not present

## 2022-07-14 DIAGNOSIS — Z170 Estrogen receptor positive status [ER+]: Secondary | ICD-10-CM | POA: Diagnosis not present

## 2022-07-14 NOTE — Progress Notes (Signed)
Patient Care Team: Rita Ohara, MD as PCP - General (Family Medicine) Jovita Kussmaul, MD as Consulting Physician (General Surgery) Nicholas Lose, MD as Consulting Physician (Hematology and Oncology) Kyung Rudd, MD as Consulting Physician (Radiation Oncology) Rockwell Germany, RN as Oncology Nurse Navigator Mauro Kaufmann, RN as Oncology Nurse Navigator  DIAGNOSIS:  Encounter Diagnosis  Name Primary?   Malignant neoplasm of lower-outer quadrant of left breast of female, estrogen receptor positive ([place])     SUMMARY OF ONCOLOGIC HISTORY: Oncology History  Malignant neoplasm of lower-outer quadrant of left breast of female, estrogen receptor positive ([place])  06/16/2022 Initial Diagnosis   Screening mammogram detected left breast mass at 3 o'clock position 0.4 cm, axilla negative, ultrasound-guided biopsy revealed grade 1 IDC with lobular features with DCIS and calcifications ER 95% PR 80% HER2 negative, Ki-67 15%   06/16/2022 Cancer Staging   Staging form: Breast, AJCC 8th Edition - Clinical: Stage IA (cT1a, cN0, cM0, G1, ER+, PR+, HER2-) - Signed by Nicholas Lose, MD on 06/16/2022 Histologic grading system: 3 grade system    Genetic Testing   [facility] Multi-Cancer Panel was Negative. Of note, a variant of uncertain significance was detected in the [facility] gene. Report date is 03/16/2019.  The Multi-Cancer Panel offered by [employer] includes sequencing and/or deletion/duplication analysis of the following 84 genes:  AIP, ALK, APC, ATM, AXIN2, BAP1, BARD1, BLM, BMPR1A, BRCA1, BRCA2, BRIP1, CASR, CDC73, CDH1, CDK4, CDKN1B, CDKN1C, CDKN2A, CEBPA, CHEK2, CTNNA1, DICER1, DIS3L2, EGFR, EPCAM, FH, FLCN, GATA2, GPC3, GREM1, HOXB13, HRAS, KIT, MAX, MEN1, MET, MITF, MLH1, MSH2, MSH3, MSH6, MUTYH, NBN, NF1, NF2, NTHL1, PALB2, PDGFRA, PHOX2B, PMS2, POLD1, POLE, POT1, PRKAR1A, PTCH1, PTEN, RAD50, RAD51C, RAD51D, RB1, RECQL4, RET, RUNX1, SDHA, SDHAF2, SDHB, SDHC, SDHD, SMAD4, SMARCA4, SMARCB1, SMARCE1, STK11,  SUFU, TERC, TERT, TMEM127, TP53, TSC1, TSC2, VHL, WRN, and WT1.          CHIEF COMPLIANT: Follow-up after surgery to discuss pathology report  INTERVAL HISTORY: Carol Harris is a 66 y.o. with the above-mentioned. She presents to the clinic for follow-up to discuss pathology report and determine the adjuvant treatment plan. She states that she is recovering fine. She still having some numbness.   ALLERGIES:  has No Known Allergies.  MEDICATIONS:  Current Outpatient Medications  Medication Sig Dispense Refill   acetaminophen (TYLENOL) 500 MG tablet Take 500 mg by mouth every 6 (six) hours as needed.     aspirin EC 81 MG tablet Take 81 mg by mouth daily.     atenolol (TENORMIN) 25 MG tablet TAKE 1 TABLET BY MOUTH TWICE A DAY 180 tablet 1   atorvastatin (LIPITOR) 40 MG tablet Take 1 tablet (40 mg total) by mouth daily. 90 tablet 3   buPROPion (WELLBUTRIN XL) 150 MG 24 hr tablet Take 450 mg by mouth every morning.     Cholecalciferol (VITAMIN D-3) 125 MCG (5000 UT) TABS Take 1 tablet by mouth 4 (four) times a week.     cyanocobalamin 100 MCG tablet Take 1,000 mcg by mouth daily.     gabapentin (NEURONTIN) 100 MG capsule Take 100 mg by mouth 3 (three) times daily.     losartan (COZAAR) 50 MG tablet TAKE 1 TABLET BY MOUTH EVERY DAY 90 tablet 1   oxyCODONE (ROXICODONE) 5 MG immediate release tablet Take 1 tablet (5 mg total) by mouth every 6 (six) hours as needed for severe pain. 15 tablet 0   VITAMIN B COMPLEX-C PO      No current facility-administered  medications for this visit.    PHYSICAL EXAMINATION: ECOG PERFORMANCE STATUS: 1 - Symptomatic but completely ambulatory  Vitals:   07/20/22 0833  BP: (!) 145/85  Pulse: 72  Resp: 18  Temp: (!) 97.2 F (36.2 C)  SpO2: 99%   Filed Weights   07/20/22 0833  Weight: 162 lb 8 oz (73.7 kg)     LABORATORY DATA:  I have reviewed the data as listed    Latest Ref Rng & Units 06/16/2022    8:28 AM 05/20/2022    2:33 PM 01/21/2021     4:34 PM  CMP  Glucose 70 - 99 mg/dL 94  92  92   BUN 8 - 23 mg/dL '10  8  13   ' Creatinine 0.44 - 1.00 mg/dL 0.81  0.86  0.77   Sodium 135 - 145 mmol/L 135  133  132   Potassium 3.5 - 5.1 mmol/L 3.9  4.3  4.1   Chloride 98 - 111 mmol/L 101  94  95   CO2 22 - 32 mmol/L '28  24  21   ' Calcium 8.9 - 10.3 mg/dL 9.7  9.9  9.6   Total Protein 6.5 - 8.1 g/dL 7.6  8.1  7.4   Total Bilirubin 0.3 - 1.2 mg/dL 0.7  0.6  0.4   Alkaline Phos 38 - 126 U/L 63  82  76   AST 15 - 41 U/L '17  21  19   ' ALT 0 - 44 U/L '17  22  19     ' Lab Results  Component Value Date   WBC 4.8 06/16/2022   HGB 14.1 06/16/2022   HCT 40.5 06/16/2022   MCV 91.0 06/16/2022   PLT 234 06/16/2022   NEUTROABS 2.9 06/16/2022    ASSESSMENT & PLAN:  Malignant neoplasm of lower-outer quadrant of left breast of female, estrogen receptor positive ([place]) Screening mammogram detected left breast mass at 3 o'clock position 0.4 cm, axilla negative, ultrasound-guided biopsy revealed grade 1 IDC with lobular features with DCIS and calcifications ER 95% PR 80% HER2 negative, Ki-67 15% 06/25/2022: Left lumpectomy: Grade 2 IDC 1.7 cm, margins negative, 0/5 sentinel nodes negative ER 95%, PR 80%, HER2 negative, Ki-67 15% Oncotype DX score: 22: 8% risk of distant recurrence  Pathology counseling: I discussed the final pathology report of the patient provided  a copy of this report. I discussed the margins as well as lymph node surgeries. We also discussed the final staging along with previously performed ER/PR and HER-2/neu testing.   Treatment plan: 1. adjuvant radiation 2. Adjuvant antiestrogen therapy  She's going to [place] for 2 weeks  Return to clinic after radiation to start antiestrogen therapy      No orders of the defined types were placed in this encounter.  The patient has a good understanding of the overall plan. she agrees with it. she will call with any problems that may develop before the next visit here. Total time  spent: 30 mins including face to face time and time spent for planning, charting and co-ordination of care   Harriette Ohara, MD 07/20/22    I Gardiner Coins am scribing for Dr. Lindi Adie  I have reviewed the above documentation for accuracy and completeness, and I agree with the above.

## 2022-07-15 ENCOUNTER — Encounter (HOSPITAL_COMMUNITY): Payer: Self-pay

## 2022-07-16 ENCOUNTER — Telehealth: Payer: Self-pay | Admitting: Family Medicine

## 2022-07-16 NOTE — Telephone Encounter (Signed)
Pt called in and is going out of the country next Thursday and where she will be traveling wants a letter written by her doctor stating medications she is taking, what they are needed for and the dosage amount. She said she can pick up the letter or we can email her the letter [email or web address]

## 2022-07-16 NOTE — Telephone Encounter (Signed)
Check with patient to see which meds she is still actually taking. She was prescribed oxycodone for breast surgery--likely not still taking this.  Provide the requested information. Atenolol and losartan are for hypertension. Atorvastatin is for high cholesterol Wellbutrin is for depression  Tylenol is for pain, as needed. Aspirin is due to history of having had a stroke. The others are vitamins and shouldn't need information, not prescribed.  Her current med list: Outpatient Encounter Medications as of 07/16/2022  Medication Sig Note   acetaminophen (TYLENOL) 500 MG tablet Take 500 mg by mouth every 6 (six) hours as needed.    aspirin EC 81 MG tablet Take 81 mg by mouth daily. 06/21/2022: Did not take today   atenolol (TENORMIN) 25 MG tablet TAKE 1 TABLET BY MOUTH TWICE A DAY    atorvastatin (LIPITOR) 40 MG tablet Take 1 tablet (40 mg total) by mouth daily.    buPROPion (WELLBUTRIN XL) 150 MG 24 hr tablet Take 450 mg by mouth every morning. 05/20/2022: Pt takes 2-3 daily   Cholecalciferol (VITAMIN D-3) 125 MCG (5000 UT) TABS Take 1 tablet by mouth 4 (four) times a week. 06/21/2022: Stopped for surgery   cyanocobalamin 100 MCG tablet Take 1,000 mcg by mouth daily. 06/21/2022: Stopped for surgery   losartan (COZAAR) 50 MG tablet TAKE 1 TABLET BY MOUTH EVERY DAY    oxyCODONE (ROXICODONE) 5 MG immediate release tablet Take 1 tablet (5 mg total) by mouth every 6 (six) hours as needed for severe pain.    VITAMIN B COMPLEX-C PO  06/21/2022: Stopped for surgery   No facility-administered encounter medications on file as of 07/16/2022.

## 2022-07-16 NOTE — Telephone Encounter (Signed)
Then please write the letter with the information as requested by patient.  Thanks

## 2022-07-19 ENCOUNTER — Encounter: Payer: Self-pay | Admitting: *Deleted

## 2022-07-19 ENCOUNTER — Encounter: Payer: Self-pay | Admitting: Rehabilitation

## 2022-07-19 ENCOUNTER — Ambulatory Visit: Payer: PPO | Attending: General Surgery | Admitting: Rehabilitation

## 2022-07-19 ENCOUNTER — Telehealth: Payer: Self-pay | Admitting: *Deleted

## 2022-07-19 DIAGNOSIS — Z780 Asymptomatic menopausal state: Secondary | ICD-10-CM | POA: Diagnosis not present

## 2022-07-19 DIAGNOSIS — Z9189 Other specified personal risk factors, not elsewhere classified: Secondary | ICD-10-CM | POA: Insufficient documentation

## 2022-07-19 DIAGNOSIS — M85851 Other specified disorders of bone density and structure, right thigh: Secondary | ICD-10-CM | POA: Diagnosis not present

## 2022-07-19 DIAGNOSIS — C50512 Malignant neoplasm of lower-outer quadrant of left female breast: Secondary | ICD-10-CM | POA: Insufficient documentation

## 2022-07-19 DIAGNOSIS — Z170 Estrogen receptor positive status [ER+]: Secondary | ICD-10-CM | POA: Diagnosis not present

## 2022-07-19 DIAGNOSIS — Z483 Aftercare following surgery for neoplasm: Secondary | ICD-10-CM | POA: Diagnosis not present

## 2022-07-19 DIAGNOSIS — R293 Abnormal posture: Secondary | ICD-10-CM | POA: Diagnosis not present

## 2022-07-19 DIAGNOSIS — M85852 Other specified disorders of bone density and structure, left thigh: Secondary | ICD-10-CM | POA: Diagnosis not present

## 2022-07-19 LAB — HM DEXA SCAN

## 2022-07-19 NOTE — Telephone Encounter (Signed)
Received oncotype results of 22/8%. Patient is aware.  She is schedule with Dr. Lisbeth Renshaw 10/31.

## 2022-07-19 NOTE — Patient Instructions (Signed)
[facility]  [address], [address]  [place] [address]  [phone]  After Breast Cancer Class It is recommended you attend the ABC class to be educated on lymphedema risk reduction. This class is free of charge and lasts for 1 hour. It is a 1-time class. You will need to download the Webex app either on your phone or computer. We will send you a link the night before or the morning of the class. You should be able to click on that link to join the class. This is not a confidential class. You don't have to turn your camera on, but other participants may be able to see your email address.  Scar massage You can begin gentle scar massage to you incision sites. Gently place one hand on the incision and move the skin (without sliding on the skin) in various directions. Do this for a few minutes and then you can gently massage either coconut oil or vitamin E cream into the scars.  Compression garment You should continue wearing your compression bra until you feel like you no longer have swelling.  Home exercise Program Continue doing the exercises you were given until you feel like you can do them without feeling any tightness at the end.   Walking Program Studies show that 30 minutes of walking per day (fast enough to elevate your heart rate) can significantly reduce the risk of a cancer recurrence. If you can't walk due to other medical reasons, we encourage you to find another activity you could do (like a stationary bike or water exercise).  Posture After breast cancer surgery, people frequently sit with rounded shoulders posture because it puts their incisions on slack and feels better. If you sit like this and scar tissue forms in that position, you can become very tight and have pain sitting or standing with good posture. Try to be aware of your posture and sit and stand up tall to heal properly.  Follow up PT: It is recommended you return every 3 months for the  first 3 years following surgery to be assessed on the SOZO machine for an L-Dex score. This helps prevent clinically significant lymphedema in 95% of patients. These follow up screens are 10 minute appointments that you are not billed for. 

## 2022-07-19 NOTE — Therapy (Signed)
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[place]) 12/19/2013   no defecits   Past Surgical History:  Procedure Laterality Date   BREAST LUMPECTOMY WITH RADIOACTIVE SEED AND SENTINEL LYMPH NODE BIOPSY Left 06/25/2022   Procedure: LEFT BREAST LUMPECTOMY WITH RADIOACTIVE SEED AND SENTINEL LYMPH NODE BIOPSY;  Surgeon: Jovita Kussmaul, MD;  Location: [facility];  Service: General;  Laterality: Left;   KNEE ARTHROSCOPY Right 2005 approx   torn meniscus   TUBAL LIGATION     twice (first failed)   uterine ablation     Patient Active Problem List   Diagnosis Date Noted   Malignant neoplasm of lower-outer quadrant of left breast of female, estrogen receptor positive ([place]) 06/16/2022   Genetic testing 06/16/2022   Restless leg syndrome 11/08/2018   Left ventricular hypertrophy 11/08/2018   Depression 11/03/2018   Family history of colon cancer in mother 11/03/2018   History of vitamin D deficiency 11/03/2018   Night terrors, adult 11/03/2018   History of CVA (cerebrovascular accident) 07/12/2014   Obstructive sleep apnea 01/08/2014   GERD (gastroesophageal reflux disease) 12/19/2013   Leukopenia 09/05/2011   HTN (hypertension), benign 09/05/2011   Hyperlipidemia, mixed 09/05/2011    REFERRING PROVIDER: Dr. Autumn Messing   REFERRING DIAG: Left breast cancer   THERAPY DIAG:  Malignant neoplasm of lower-outer quadrant of left breast of female, estrogen receptor positive ([place])   Abnormal posture   Rationale for Evaluation and Treatment Rehabilitation   ONSET DATE: 06/10/2022   SUBJECTIVE                                                                                                                                                                                             SUBJECTIVE STATEMENT: I am having some trouble with the irritation of the bra.  I started the exercises.  The breast does seem swollen.      PERTINENT HISTORY:  Patient was diagnosed on 06/10/2022 with left grade 1 invasive ductal carcinoma breast cancer. It measures 4 mm and is located in the lower outer quadrant. It is ER/PR positive and HER2 negative with a Ki67 of 15%. She had a left lumpectomy and SLNB on 06/25/22 with 5 negative nodes removed.  Will be having radiation.    PATIENT GOALS:  Reassess how my recovery is going related to arm function, pain, and swelling.  PAIN:  Are you having pain? Yes: NPRS scale: 3/10 Pain location: tricep region Pain description: achy, dull, can be sharp Aggravating factors: touching Relieving factors: taking neurontin, tylenol  PRECAUTIONS: Recent Surgery, left UE Lymphedema risk  ACTIVITY LEVEL / LEISURE: I am not lifting much yet.     OBJECTIVE:  PATIENT SURVEYS:  QUICK DASH: 27%  OBSERVATIONS:  Cording evident in the axilla with arm overhead  POSTURE:  Rounded shoulders  LYMPHEDEMA ASSESSMENT:   A/PROM RIGHT   eval    Shoulder extension 41  Shoulder flexion 154  Shoulder abduction 157  Shoulder internal rotation 66  Shoulder external rotation 85                          (Blank rows = not tested)   A/PROM LEFT   eval 07/19/22  Shoulder extension 39 40  Shoulder flexion 149 143  Shoulder abduction 147 146  Shoulder internal rotation 65   Shoulder external rotation 81 81                          (Blank rows = not tested)     CERVICAL AROM: All within normal limits   UPPER EXTREMITY STRENGTH: WNL    LYMPHEDEMA ASSESSMENTS:    LANDMARK RIGHT   eval 07/19/22  10 cm proximal to olecranon process 27.5 27.5  Olecranon process '25 25  10 ' cm proximal  to ulnar styloid process 20.6 20.8  Just proximal to ulnar styloid process 15.6 16  Across hand at thumb web space 19 19  At base of 2nd digit 6.2 6.2  (Blank rows = not tested)   LANDMARK LEFT   eval 07/19/22  Axilla  30.5  10 cm proximal to olecranon process 27.4 27.4  Olecranon process 25.3 25.3  10 cm proximal to ulnar styloid process 20.3 20.3  Just proximal to ulnar styloid process 15.3 15.3  Across hand at thumb web space 18.7 19  At base of 2nd digit 6.1 6.2  (Blank rows = not tested)    PATIENT EDUCATION:  Education details: per instruction section and today's date Person educated: Patient Education method: Explanation, Demonstration, Verbal cues, and Handouts Education comprehension: verbalized understanding  HOME EXERCISE PROGRAM:  Reviewed previously given post op HEP.   ASSESSMENT:  CLINICAL IMPRESSION: Pt is doing well post-operatively with some cording evident but no real decrease in AROM.  Noting edema post surgically.  Will continue with visits as needed and SOZO screens.   Pt will benefit from skilled therapeutic intervention to improve on the following deficits: Decreased knowledge of precautions, impaired UE functional use, pain, decreased ROM, postural dysfunction.   PT treatment/interventions: ADL/Self care home management, Therapeutic exercises, Patient/Family education, Manual therapy, and Re-evaluation     GOALS: Goals reviewed with patient? Yes  LONG TERM GOALS:  (STG=LTG)  GOALS Name Target Date  Goal status  1 Pt will demonstrate she has regained full shoulder ROM and function post operatively compared to baselines.  Baseline: 07/19/22 MET                    PLAN: PT FREQUENCY/DURATION: SOZO screens  PLAN FOR NEXT SESSION: SOZO screens   [facility]  [address], [address]  [place] [address]  [phone]  After Breast Cancer Class It is recommended you attend the ABC class to be educated on  lymphedema risk reduction. This class is free of charge and lasts for 1 hour. It is a 1-time class. You will need to download the Webex app either on your phone or computer. We will send you a link the night before or the morning of the class. You should be able to click on that link to join the class. This is not a confidential class. You don't have to turn your camera on, but other participants may be able to see your email address.  Scar massage You can begin gentle scar massage to you incision sites. Gently place one hand on the incision and move the skin (without sliding on the skin) in various directions. Do this for a few minutes and then you can gently massage either coconut oil or vitamin E cream into the scars.  Compression garment You should continue wearing your compression bra until you feel like you no longer have swelling.  Home exercise Program Continue doing the exercises you were given until you feel like you can do them without feeling any tightness at the end.   Walking Program Studies show that 30 minutes of walking per day (fast enough to elevate your heart rate) can significantly reduce the risk of a cancer recurrence. If you can't walk due to other medical reasons, we encourage you to find another activity you could do (like a stationary bike or water exercise).  Posture After breast cancer surgery, people frequently sit with rounded shoulders posture because it puts their incisions on slack and feels better. If you sit like this and scar tissue forms in that position, you can become very tight and have pain sitting or standing with good posture. Try to be aware of your posture and sit and stand up tall to heal properly.  Follow up PT: It is recommended you return every 3 months for the first 3 years following surgery to be assessed on the SOZO machine for an L-Dex score. This helps prevent clinically significant lymphedema in 95% of patients. These follow up screens are 10  minute appointments that you are not billed for.  Stark Bray, PT 09/29/2022, 5:32 PM

## 2022-07-20 ENCOUNTER — Other Ambulatory Visit: Payer: Self-pay

## 2022-07-20 ENCOUNTER — Inpatient Hospital Stay: Payer: PPO | Attending: Hematology and Oncology | Admitting: Hematology and Oncology

## 2022-07-20 ENCOUNTER — Encounter (HOSPITAL_COMMUNITY): Payer: Self-pay

## 2022-07-20 DIAGNOSIS — C50512 Malignant neoplasm of lower-outer quadrant of left female breast: Secondary | ICD-10-CM | POA: Diagnosis not present

## 2022-07-20 DIAGNOSIS — N6325 Unspecified lump in the left breast, overlapping quadrants: Secondary | ICD-10-CM | POA: Insufficient documentation

## 2022-07-20 DIAGNOSIS — R200 Anesthesia of skin: Secondary | ICD-10-CM | POA: Insufficient documentation

## 2022-07-20 DIAGNOSIS — Z170 Estrogen receptor positive status [ER+]: Secondary | ICD-10-CM | POA: Diagnosis not present

## 2022-07-20 DIAGNOSIS — Z79899 Other long term (current) drug therapy: Secondary | ICD-10-CM | POA: Insufficient documentation

## 2022-07-20 NOTE — Assessment & Plan Note (Addendum)
Screening mammogram detected left breast mass at 3 o'clock position 0.4 cm, axilla negative, ultrasound-guided biopsy revealed grade 1 IDC with lobular features with DCIS and calcifications ER 95% PR 80% HER2 negative, Ki-67 15% 06/25/2022: Left lumpectomy: Grade 2 IDC 1.7 cm, margins negative, 0/5 sentinel nodes negative ER 95%, PR 80%, HER2 negative, Ki-67 15% Oncotype DX score: 22: 8% risk of distant recurrence  Pathology counseling: I discussed the final pathology report of the patient provided  a copy of this report. I discussed the margins as well as lymph node surgeries. We also discussed the final staging along with previously performed ER/PR and HER-2/neu testing.   Treatment plan: 1. adjuvant radiation 2. Adjuvant antiestrogen therapy  She's going to [place] for 2 weeks  Return to clinic after radiation to start antiestrogen therapy

## 2022-07-21 ENCOUNTER — Encounter: Payer: Self-pay | Admitting: *Deleted

## 2022-07-21 DIAGNOSIS — C50912 Malignant neoplasm of unspecified site of left female breast: Secondary | ICD-10-CM | POA: Diagnosis not present

## 2022-07-22 ENCOUNTER — Other Ambulatory Visit: Payer: Self-pay | Admitting: Family Medicine

## 2022-07-22 ENCOUNTER — Encounter: Payer: Self-pay | Admitting: Hematology and Oncology

## 2022-07-22 DIAGNOSIS — I10 Essential (primary) hypertension: Secondary | ICD-10-CM

## 2022-08-03 DIAGNOSIS — C50912 Malignant neoplasm of unspecified site of left female breast: Secondary | ICD-10-CM | POA: Diagnosis not present

## 2022-08-09 NOTE — Progress Notes (Signed)
Radiation Oncology         [phone] ________________________________  Name: Carol Harris        MRN: [id number]  Date of Service: 08/10/2022 DOB: [birth year]  HU:[id number], Tera Helper, MD  Nicholas Lose, MD     REFERRING PHYSICIAN: Nicholas Lose, MD   DIAGNOSIS: The encounter diagnosis was Malignant neoplasm of lower-outer quadrant of left breast of female, estrogen receptor positive ([place]).   HISTORY OF PRESENT ILLNESS: Carol Harris is a 66 y.o. female seen in the multidisciplinary breast clinic for a new diagnosis of left breast cancer. The patient was noted to have screening detected abnormality in the left breast.  This was noted by ultrasound in the 3 o'clock position measuring 4 mm.  Her axilla was negative for adenopathy.  She underwent a biopsy on 06/09/2022 that showed a grade 1 invasive ductal carcinoma with associated DCIS.  Her invasive cancer was ER/PR positive, HER2 negative with a Ki-67 of 15%.  She underwent left lumpectomy with sentinel lymph node biopsy on 06/25/2022.  This showed a grade 2 invasive ductal carcinoma measuring 1.7 cm.  Resection margins were negative for disease.  5 sentinel lymph nodes were submitted and none contained metastatic disease.  She does have a variant of undetermined significance of the SMARCA4 gene.  Oncotype DX score resulted at 22 and no systemic chemotherapy has been recommended.  She is seen to discuss adjuvant radiotherapy.    PREVIOUS RADIATION THERAPY: No   PAST MEDICAL HISTORY:  Past Medical History:  Diagnosis Date   Anxiety    Colon polyp    GERD (gastroesophageal reflux disease)    HTN (hypertension), benign 09/05/2011   Hyperlipidemia, mixed 09/05/2011   Leukopenia 09/05/2011   Sleep apnea    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[place]) 12/19/2013   no defecits       PAST SURGICAL HISTORY: Past Surgical History:  Procedure Laterality Date   BREAST LUMPECTOMY WITH RADIOACTIVE SEED AND SENTINEL LYMPH NODE BIOPSY Left 06/25/2022    Procedure: LEFT BREAST LUMPECTOMY WITH RADIOACTIVE SEED AND SENTINEL LYMPH NODE BIOPSY;  Surgeon: Jovita Kussmaul, MD;  Location: [facility];  Service: General;  Laterality: Left;   KNEE ARTHROSCOPY Right 2005 approx   torn meniscus   TUBAL LIGATION     twice (first failed)   uterine ablation       FAMILY HISTORY:  Family History  Problem Relation Age of Onset   Hypertension Father    Hyperlipidemia Father    Stroke Father        multiple and TIA's   Sleep apnea Father    Hypertension Mother    Uterine cancer Mother    Colon cancer Mother 86   Bipolar disorder Daughter        schizo affective   Uterine cancer Sister    Hyperlipidemia Sister    Acute myelogenous leukemia Brother        s/p stem cell transplant (in 58's)   Endometriosis Sister    Hyperlipidemia Sister      SOCIAL HISTORY:  reports that she has never smoked. She has never used smokeless tobacco. She reports current alcohol use of about 1.0 standard drink of alcohol per week. She reports that she does not use drugs.  The patient is married and lives in [place]. She ***   ALLERGIES: Patient has no known allergies.   MEDICATIONS:  Current Outpatient Medications  Medication Sig Dispense Refill   acetaminophen (TYLENOL) 500 MG tablet Take 500 mg by mouth  every 6 (six) hours as needed.     aspirin EC 81 MG tablet Take 81 mg by mouth daily.     atenolol (TENORMIN) 25 MG tablet TAKE 1 TABLET BY MOUTH TWICE A DAY 180 tablet 2   atorvastatin (LIPITOR) 40 MG tablet Take 1 tablet (40 mg total) by mouth daily. 90 tablet 3   buPROPion (WELLBUTRIN XL) 150 MG 24 hr tablet Take 450 mg by mouth every morning.     Cholecalciferol (VITAMIN D-3) 125 MCG (5000 UT) TABS Take 1 tablet by mouth 4 (four) times a week.     cyanocobalamin 100 MCG tablet Take 1,000 mcg by mouth daily.     gabapentin (NEURONTIN) 100 MG capsule Take 100 mg by mouth 3 (three) times daily.     losartan (COZAAR) 50 MG tablet TAKE 1 TABLET  BY MOUTH EVERY DAY 90 tablet 2   oxyCODONE (ROXICODONE) 5 MG immediate release tablet Take 1 tablet (5 mg total) by mouth every 6 (six) hours as needed for severe pain. 15 tablet 0   VITAMIN B COMPLEX-C PO      No current facility-administered medications for this visit.     REVIEW OF SYSTEMS: On review of systems, the patient reports that she is doing ***     PHYSICAL EXAM:  Wt Readings from Last 3 Encounters:  07/20/22 162 lb 8 oz (73.7 kg)  06/25/22 156 lb 15.5 oz (71.2 kg)  06/21/22 159 lb 6.4 oz (72.3 kg)   Temp Readings from Last 3 Encounters:  07/20/22 (!) 97.2 F (36.2 C) (Temporal)  06/25/22 97.6 F (36.4 C)  06/16/22 97.8 F (36.6 C) (Temporal)   BP Readings from Last 3 Encounters:  07/20/22 (!) 145/85  06/25/22 (!) 146/88  06/21/22 126/80   Pulse Readings from Last 3 Encounters:  07/20/22 72  06/25/22 76  06/21/22 60    In general this is a well appearing *** female in no acute distress. She's alert and oriented x4 and appropriate throughout the examination. Cardiopulmonary assessment is negative for acute distress and she exhibits normal effort. Bilateral breast exam is deferred.    ECOG = ***  0 - Asymptomatic (Fully active, able to carry on all predisease activities without restriction)  1 - Symptomatic but completely ambulatory (Restricted in physically strenuous activity but ambulatory and able to carry out work of a light or sedentary nature. For example, light housework, office work)  2 - Symptomatic, <50% in bed during the day (Ambulatory and capable of all self care but unable to carry out any work activities. Up and about more than 50% of waking hours)  3 - Symptomatic, >50% in bed, but not bedbound (Capable of only limited self-care, confined to bed or chair 50% or more of waking hours)  4 - Bedbound (Completely disabled. Cannot carry on any self-care. Totally confined to bed or chair)  5 - Death   Eustace Pen MM, Creech RH, Tormey DC, et al.  [phone]). "Toxicity and response criteria of the [facility]". [place] Oncol. 5 (6): 649-55    LABORATORY DATA:  Lab Results  Component Value Date   WBC 4.8 06/16/2022   HGB 14.1 06/16/2022   HCT 40.5 06/16/2022   MCV 91.0 06/16/2022   PLT 234 06/16/2022   Lab Results  Component Value Date   NA 135 06/16/2022   K 3.9 06/16/2022   CL 101 06/16/2022   CO2 28 06/16/2022   Lab Results  Component Value Date  ALT 17 06/16/2022   AST 17 06/16/2022   ALKPHOS 63 06/16/2022   BILITOT 0.7 06/16/2022      RADIOGRAPHY: No results found.     IMPRESSION/PLAN: 1. Stage IA, [id number], grade 2, ER/PR positive, invasive ductal carcinoma of the left breast. Dr. Lisbeth Renshaw discusses the pathology findings and reviews the nature of early stage breast disease. She has done well since surgery and does not need systemic chemotherapy.  Dr. Lisbeth Renshaw does recommend erapy.external radiotherapy to the breast  to reduce risks of local recurrence followed by antiestrogen therapy. We discussed the risks, benefits, short, and long term effects of radiotherapy, as well as the curative intent, and the patient is interested in proceeding. Dr. Lisbeth Renshaw discusses the delivery and logistics of radiotherapy and anticipates a course of 4 weeks of radiotherapy to the left breast with deep inspiration breath-hold technique. Written consent is obtained and placed in the chart, a copy was provided to the patient. The patient will be contacted to coordinate treatment planning by our simulation department.    In a visit lasting *** minutes, greater than 50% of the time was spent face to face reviewing her case, as well as in preparation of, discussing, and coordinating the patient's care.  The above documentation reflects my direct findings during this shared patient visit. Please see the separate note by Dr. Lisbeth Renshaw on this date for the remainder of the patient's plan of care.    Carola Rhine,  [facility]    **Disclaimer: This note was dictated with voice recognition software. Similar sounding words can inadvertently be transcribed and this note may contain transcription errors which may not have been corrected upon publication of note.**

## 2022-08-10 ENCOUNTER — Encounter: Payer: Self-pay | Admitting: Family Medicine

## 2022-08-10 ENCOUNTER — Encounter: Payer: Self-pay | Admitting: Radiation Oncology

## 2022-08-10 ENCOUNTER — Ambulatory Visit
Admission: RE | Admit: 2022-08-10 | Discharge: 2022-08-10 | Disposition: A | Discharge status: Home / Self Care | Payer: PPO | Source: Ambulatory Visit | Attending: Radiation Oncology | Admitting: Radiation Oncology

## 2022-08-10 ENCOUNTER — Other Ambulatory Visit: Payer: Self-pay

## 2022-08-10 VITALS — BP 149/91 | HR 65 | Temp 97.7°F | Resp 18 | Ht 65.0 in | Wt 160.1 lb

## 2022-08-10 DIAGNOSIS — Z79899 Other long term (current) drug therapy: Secondary | ICD-10-CM | POA: Insufficient documentation

## 2022-08-10 DIAGNOSIS — Z170 Estrogen receptor positive status [ER+]: Secondary | ICD-10-CM | POA: Insufficient documentation

## 2022-08-10 DIAGNOSIS — G4730 Sleep apnea, unspecified: Secondary | ICD-10-CM | POA: Insufficient documentation

## 2022-08-10 DIAGNOSIS — Z7982 Long term (current) use of aspirin: Secondary | ICD-10-CM | POA: Insufficient documentation

## 2022-08-10 DIAGNOSIS — Z86010 Personal history of colonic polyps: Secondary | ICD-10-CM | POA: Diagnosis not present

## 2022-08-10 DIAGNOSIS — Z800 Family history of malignant neoplasm of digestive organs: Secondary | ICD-10-CM | POA: Insufficient documentation

## 2022-08-10 DIAGNOSIS — I10 Essential (primary) hypertension: Secondary | ICD-10-CM | POA: Insufficient documentation

## 2022-08-10 DIAGNOSIS — C50512 Malignant neoplasm of lower-outer quadrant of left female breast: Secondary | ICD-10-CM | POA: Insufficient documentation

## 2022-08-10 DIAGNOSIS — E785 Hyperlipidemia, unspecified: Secondary | ICD-10-CM | POA: Diagnosis not present

## 2022-08-10 DIAGNOSIS — K219 Gastro-esophageal reflux disease without esophagitis: Secondary | ICD-10-CM | POA: Diagnosis not present

## 2022-08-10 DIAGNOSIS — D72819 Decreased white blood cell count, unspecified: Secondary | ICD-10-CM | POA: Diagnosis not present

## 2022-08-10 NOTE — Progress Notes (Addendum)
Consult nursing interview for 66yrold female with Malignant neoplasm of lower-outer quadrant of left breast of female, estrogen receptor positive ([place].   I verified patient's identity and began nursing interview. Patient reports LT breast/axilla tenderness at incision line, and LT arm pain 4/10.  No other issues reported at this time.  Meaningful use complete. Postmenopausal-NO chances of pregnancy.  BP (!) 149/91 (BP Location: Left Arm, Patient Position: Sitting, Cuff Size: Normal)   Pulse 65   Temp 97.7 F (36.5 C) (Oral)   Resp 18   Ht '5\' 5"'$  (1.651 m)   Wt 160 lb 2 oz (72.6 kg)   SpO2 100%   BMI 26.65 kg/m

## 2022-08-16 DIAGNOSIS — Z170 Estrogen receptor positive status [ER+]: Secondary | ICD-10-CM | POA: Insufficient documentation

## 2022-08-16 DIAGNOSIS — C50512 Malignant neoplasm of lower-outer quadrant of left female breast: Secondary | ICD-10-CM | POA: Insufficient documentation

## 2022-08-17 ENCOUNTER — Encounter: Payer: Self-pay | Admitting: *Deleted

## 2022-08-17 ENCOUNTER — Other Ambulatory Visit: Payer: Self-pay

## 2022-08-17 ENCOUNTER — Ambulatory Visit
Admission: RE | Admit: 2022-08-17 | Discharge: 2022-08-17 | Disposition: A | Discharge status: Home / Self Care | Payer: PPO | Source: Ambulatory Visit | Attending: Radiation Oncology | Admitting: Radiation Oncology

## 2022-08-17 DIAGNOSIS — Z170 Estrogen receptor positive status [ER+]: Secondary | ICD-10-CM | POA: Diagnosis not present

## 2022-08-17 DIAGNOSIS — C50512 Malignant neoplasm of lower-outer quadrant of left female breast: Secondary | ICD-10-CM | POA: Diagnosis not present

## 2022-08-17 DIAGNOSIS — Z510 Encounter for antineoplastic radiation therapy: Secondary | ICD-10-CM | POA: Diagnosis not present

## 2022-08-17 LAB — RAD ONC ARIA SESSION SUMMARY
Course Elapsed Days: 0
Plan Fractions Treated to Date: 1
Plan Prescribed Dose Per Fraction: 2.66 Gy
Plan Total Fractions Prescribed: 16
Plan Total Prescribed Dose: 42.56 Gy
Reference Point Dosage Given to Date: 2.66 Gy
Reference Point Session Dosage Given: 2.66 Gy
Session Number: 1

## 2022-08-18 ENCOUNTER — Other Ambulatory Visit: Payer: Self-pay

## 2022-08-18 ENCOUNTER — Ambulatory Visit
Admission: RE | Admit: 2022-08-18 | Discharge: 2022-08-18 | Disposition: A | Discharge status: Home / Self Care | Payer: PPO | Source: Ambulatory Visit | Attending: Radiation Oncology | Admitting: Radiation Oncology

## 2022-08-18 ENCOUNTER — Telehealth: Payer: Self-pay | Admitting: Hematology and Oncology

## 2022-08-18 DIAGNOSIS — Z510 Encounter for antineoplastic radiation therapy: Secondary | ICD-10-CM | POA: Diagnosis not present

## 2022-08-18 DIAGNOSIS — C50512 Malignant neoplasm of lower-outer quadrant of left female breast: Secondary | ICD-10-CM | POA: Diagnosis not present

## 2022-08-18 DIAGNOSIS — Z170 Estrogen receptor positive status [ER+]: Secondary | ICD-10-CM | POA: Diagnosis not present

## 2022-08-18 LAB — RAD ONC ARIA SESSION SUMMARY
Course Elapsed Days: 1
Plan Fractions Treated to Date: 2
Plan Prescribed Dose Per Fraction: 2.66 Gy
Plan Total Fractions Prescribed: 16
Plan Total Prescribed Dose: 42.56 Gy
Reference Point Dosage Given to Date: 5.32 Gy
Reference Point Session Dosage Given: 2.66 Gy
Session Number: 2

## 2022-08-18 NOTE — Telephone Encounter (Signed)
Called patient per 11/7 in basket. Patient notified of upcoming appointments.

## 2022-08-19 ENCOUNTER — Ambulatory Visit
Admission: RE | Admit: 2022-08-19 | Discharge: 2022-08-19 | Disposition: A | Discharge status: Home / Self Care | Payer: PPO | Source: Ambulatory Visit | Attending: Radiation Oncology | Admitting: Radiation Oncology

## 2022-08-19 ENCOUNTER — Other Ambulatory Visit: Payer: Self-pay

## 2022-08-19 DIAGNOSIS — Z170 Estrogen receptor positive status [ER+]: Secondary | ICD-10-CM | POA: Diagnosis not present

## 2022-08-19 DIAGNOSIS — C50512 Malignant neoplasm of lower-outer quadrant of left female breast: Secondary | ICD-10-CM | POA: Diagnosis not present

## 2022-08-19 DIAGNOSIS — Z510 Encounter for antineoplastic radiation therapy: Secondary | ICD-10-CM | POA: Diagnosis not present

## 2022-08-19 LAB — RAD ONC ARIA SESSION SUMMARY
Course Elapsed Days: 2
Plan Fractions Treated to Date: 3
Plan Prescribed Dose Per Fraction: 2.66 Gy
Plan Total Fractions Prescribed: 16
Plan Total Prescribed Dose: 42.56 Gy
Reference Point Dosage Given to Date: 7.98 Gy
Reference Point Session Dosage Given: 2.66 Gy
Session Number: 3

## 2022-08-20 ENCOUNTER — Ambulatory Visit
Admission: RE | Admit: 2022-08-20 | Discharge: 2022-08-20 | Disposition: A | Discharge status: Home / Self Care | Payer: PPO | Source: Ambulatory Visit | Attending: Radiation Oncology | Admitting: Radiation Oncology

## 2022-08-20 ENCOUNTER — Other Ambulatory Visit: Payer: Self-pay

## 2022-08-20 DIAGNOSIS — Z170 Estrogen receptor positive status [ER+]: Secondary | ICD-10-CM | POA: Diagnosis not present

## 2022-08-20 DIAGNOSIS — C50512 Malignant neoplasm of lower-outer quadrant of left female breast: Secondary | ICD-10-CM

## 2022-08-20 DIAGNOSIS — Z510 Encounter for antineoplastic radiation therapy: Secondary | ICD-10-CM | POA: Diagnosis not present

## 2022-08-20 LAB — RAD ONC ARIA SESSION SUMMARY
Course Elapsed Days: 3
Plan Fractions Treated to Date: 4
Plan Prescribed Dose Per Fraction: 2.66 Gy
Plan Total Fractions Prescribed: 16
Plan Total Prescribed Dose: 42.56 Gy
Reference Point Dosage Given to Date: 10.64 Gy
Reference Point Session Dosage Given: 2.66 Gy
Session Number: 4

## 2022-08-20 MED ORDER — ALRA NON-METALLIC DEODORANT (RAD-ONC)
1.0000 | Freq: Once | TOPICAL | Status: AC
  Administered 2022-08-20: 1 via TOPICAL

## 2022-08-20 MED ORDER — RADIAPLEXRX EX GEL: Freq: Once | CUTANEOUS | Status: AC

## 2022-08-20 NOTE — Progress Notes (Signed)
Pt here for patient teaching. Pt given Radiation and You booklet, skin care instructions, Alra deodorant, and Radiaplex gel. Reviewed areas of pertinence such as fatigue, hair loss, nausea and vomiting, skin changes, breast tenderness, and breast swelling. Pt able to give teach back of to pat skin, use unscented/gentle soap, use baby wipes, have Imodium on hand, drink plenty of water, and sitz bath, apply Radiaplex bid, avoid applying anything to skin within 4 hours of treatment, avoid wearing an under wire bra, and to use an electric razor if they must shave. Pt verbalizes understanding of information given and will contact nursing with any questions or concerns.     Http://rtanswers.org/treatmentinformation/whattoexpect/index

## 2022-08-23 ENCOUNTER — Other Ambulatory Visit: Payer: Self-pay

## 2022-08-23 ENCOUNTER — Ambulatory Visit
Admission: RE | Admit: 2022-08-23 | Discharge: 2022-08-23 | Disposition: A | Discharge status: Home / Self Care | Payer: PPO | Source: Ambulatory Visit | Attending: Radiation Oncology | Admitting: Radiation Oncology

## 2022-08-23 DIAGNOSIS — Z170 Estrogen receptor positive status [ER+]: Secondary | ICD-10-CM | POA: Diagnosis not present

## 2022-08-23 DIAGNOSIS — C50512 Malignant neoplasm of lower-outer quadrant of left female breast: Secondary | ICD-10-CM | POA: Diagnosis not present

## 2022-08-23 DIAGNOSIS — Z510 Encounter for antineoplastic radiation therapy: Secondary | ICD-10-CM | POA: Diagnosis not present

## 2022-08-23 LAB — RAD ONC ARIA SESSION SUMMARY
Course Elapsed Days: 6
Plan Fractions Treated to Date: 5
Plan Prescribed Dose Per Fraction: 2.66 Gy
Plan Total Fractions Prescribed: 16
Plan Total Prescribed Dose: 42.56 Gy
Reference Point Dosage Given to Date: 13.3 Gy
Reference Point Session Dosage Given: 2.66 Gy
Session Number: 5

## 2022-08-24 ENCOUNTER — Ambulatory Visit
Admission: RE | Admit: 2022-08-24 | Discharge: 2022-08-24 | Disposition: A | Discharge status: Home / Self Care | Payer: PPO | Source: Ambulatory Visit | Attending: Radiation Oncology | Admitting: Radiation Oncology

## 2022-08-24 ENCOUNTER — Other Ambulatory Visit: Payer: Self-pay

## 2022-08-24 DIAGNOSIS — C50512 Malignant neoplasm of lower-outer quadrant of left female breast: Secondary | ICD-10-CM | POA: Diagnosis not present

## 2022-08-24 DIAGNOSIS — Z510 Encounter for antineoplastic radiation therapy: Secondary | ICD-10-CM | POA: Diagnosis not present

## 2022-08-24 DIAGNOSIS — Z170 Estrogen receptor positive status [ER+]: Secondary | ICD-10-CM | POA: Diagnosis not present

## 2022-08-24 LAB — RAD ONC ARIA SESSION SUMMARY
Course Elapsed Days: 7
Plan Fractions Treated to Date: 6
Plan Prescribed Dose Per Fraction: 2.66 Gy
Plan Total Fractions Prescribed: 16
Plan Total Prescribed Dose: 42.56 Gy
Reference Point Dosage Given to Date: 15.96 Gy
Reference Point Session Dosage Given: 2.66 Gy
Session Number: 6

## 2022-08-25 ENCOUNTER — Other Ambulatory Visit: Payer: Self-pay

## 2022-08-25 ENCOUNTER — Ambulatory Visit
Admission: RE | Admit: 2022-08-25 | Discharge: 2022-08-25 | Disposition: A | Discharge status: Home / Self Care | Payer: PPO | Source: Ambulatory Visit | Attending: Radiation Oncology | Admitting: Radiation Oncology

## 2022-08-25 DIAGNOSIS — Z170 Estrogen receptor positive status [ER+]: Secondary | ICD-10-CM | POA: Diagnosis not present

## 2022-08-25 DIAGNOSIS — C50512 Malignant neoplasm of lower-outer quadrant of left female breast: Secondary | ICD-10-CM | POA: Diagnosis not present

## 2022-08-25 DIAGNOSIS — Z510 Encounter for antineoplastic radiation therapy: Secondary | ICD-10-CM | POA: Diagnosis not present

## 2022-08-25 LAB — RAD ONC ARIA SESSION SUMMARY
Course Elapsed Days: 8
Plan Fractions Treated to Date: 7
Plan Prescribed Dose Per Fraction: 2.66 Gy
Plan Total Fractions Prescribed: 16
Plan Total Prescribed Dose: 42.56 Gy
Reference Point Dosage Given to Date: 18.62 Gy
Reference Point Session Dosage Given: 2.66 Gy
Session Number: 7

## 2022-08-26 ENCOUNTER — Other Ambulatory Visit: Payer: Self-pay

## 2022-08-26 ENCOUNTER — Ambulatory Visit
Admission: RE | Admit: 2022-08-26 | Discharge: 2022-08-26 | Disposition: A | Discharge status: Home / Self Care | Payer: PPO | Source: Ambulatory Visit | Attending: Radiation Oncology | Admitting: Radiation Oncology

## 2022-08-26 DIAGNOSIS — Z510 Encounter for antineoplastic radiation therapy: Secondary | ICD-10-CM | POA: Diagnosis not present

## 2022-08-26 DIAGNOSIS — C50512 Malignant neoplasm of lower-outer quadrant of left female breast: Secondary | ICD-10-CM | POA: Diagnosis not present

## 2022-08-26 DIAGNOSIS — Z170 Estrogen receptor positive status [ER+]: Secondary | ICD-10-CM | POA: Diagnosis not present

## 2022-08-26 LAB — RAD ONC ARIA SESSION SUMMARY
Course Elapsed Days: 9
Plan Fractions Treated to Date: 8
Plan Prescribed Dose Per Fraction: 2.66 Gy
Plan Total Fractions Prescribed: 16
Plan Total Prescribed Dose: 42.56 Gy
Reference Point Dosage Given to Date: 21.28 Gy
Reference Point Session Dosage Given: 2.66 Gy
Session Number: 8

## 2022-08-27 ENCOUNTER — Other Ambulatory Visit: Payer: Self-pay

## 2022-08-27 ENCOUNTER — Ambulatory Visit
Admission: RE | Admit: 2022-08-27 | Discharge: 2022-08-27 | Disposition: A | Discharge status: Home / Self Care | Payer: PPO | Source: Ambulatory Visit | Attending: Radiation Oncology | Admitting: Radiation Oncology

## 2022-08-27 DIAGNOSIS — C50512 Malignant neoplasm of lower-outer quadrant of left female breast: Secondary | ICD-10-CM | POA: Diagnosis not present

## 2022-08-27 DIAGNOSIS — Z170 Estrogen receptor positive status [ER+]: Secondary | ICD-10-CM | POA: Diagnosis not present

## 2022-08-27 DIAGNOSIS — Z510 Encounter for antineoplastic radiation therapy: Secondary | ICD-10-CM | POA: Diagnosis not present

## 2022-08-27 LAB — RAD ONC ARIA SESSION SUMMARY
Course Elapsed Days: 10
Plan Fractions Treated to Date: 9
Plan Prescribed Dose Per Fraction: 2.66 Gy
Plan Total Fractions Prescribed: 16
Plan Total Prescribed Dose: 42.56 Gy
Reference Point Dosage Given to Date: 23.94 Gy
Reference Point Session Dosage Given: 2.66 Gy
Session Number: 9

## 2022-08-29 ENCOUNTER — Other Ambulatory Visit: Payer: Self-pay

## 2022-08-29 ENCOUNTER — Ambulatory Visit
Admission: RE | Admit: 2022-08-29 | Discharge: 2022-08-29 | Disposition: A | Discharge status: Home / Self Care | Payer: PPO | Source: Ambulatory Visit | Attending: Radiation Oncology | Admitting: Radiation Oncology

## 2022-08-29 DIAGNOSIS — C50512 Malignant neoplasm of lower-outer quadrant of left female breast: Secondary | ICD-10-CM | POA: Diagnosis not present

## 2022-08-29 DIAGNOSIS — Z510 Encounter for antineoplastic radiation therapy: Secondary | ICD-10-CM | POA: Diagnosis not present

## 2022-08-29 DIAGNOSIS — Z170 Estrogen receptor positive status [ER+]: Secondary | ICD-10-CM | POA: Diagnosis not present

## 2022-08-29 LAB — RAD ONC ARIA SESSION SUMMARY
Course Elapsed Days: 12
Plan Fractions Treated to Date: 10
Plan Prescribed Dose Per Fraction: 2.66 Gy
Plan Total Fractions Prescribed: 16
Plan Total Prescribed Dose: 42.56 Gy
Reference Point Dosage Given to Date: 26.6 Gy
Reference Point Session Dosage Given: 2.5538 Gy
Session Number: 10

## 2022-08-30 ENCOUNTER — Other Ambulatory Visit: Payer: Self-pay

## 2022-08-30 ENCOUNTER — Ambulatory Visit
Admission: RE | Admit: 2022-08-30 | Discharge: 2022-08-30 | Disposition: A | Discharge status: Home / Self Care | Payer: PPO | Source: Ambulatory Visit | Attending: Radiation Oncology | Admitting: Radiation Oncology

## 2022-08-30 DIAGNOSIS — Z170 Estrogen receptor positive status [ER+]: Secondary | ICD-10-CM | POA: Diagnosis not present

## 2022-08-30 DIAGNOSIS — C50512 Malignant neoplasm of lower-outer quadrant of left female breast: Secondary | ICD-10-CM | POA: Diagnosis not present

## 2022-08-30 DIAGNOSIS — Z510 Encounter for antineoplastic radiation therapy: Secondary | ICD-10-CM | POA: Diagnosis not present

## 2022-08-30 LAB — RAD ONC ARIA SESSION SUMMARY
Course Elapsed Days: 13
Plan Fractions Treated to Date: 11
Plan Prescribed Dose Per Fraction: 2.66 Gy
Plan Total Fractions Prescribed: 16
Plan Total Prescribed Dose: 42.56 Gy
Reference Point Dosage Given to Date: 29.26 Gy
Reference Point Session Dosage Given: 2.66 Gy
Session Number: 11

## 2022-08-31 ENCOUNTER — Other Ambulatory Visit: Payer: Self-pay

## 2022-08-31 ENCOUNTER — Ambulatory Visit
Admission: RE | Admit: 2022-08-31 | Discharge: 2022-08-31 | Disposition: A | Discharge status: Home / Self Care | Payer: PPO | Source: Ambulatory Visit | Attending: Radiation Oncology | Admitting: Radiation Oncology

## 2022-08-31 ENCOUNTER — Ambulatory Visit: Payer: PPO | Admitting: Radiation Oncology

## 2022-08-31 DIAGNOSIS — C50512 Malignant neoplasm of lower-outer quadrant of left female breast: Secondary | ICD-10-CM | POA: Diagnosis not present

## 2022-08-31 DIAGNOSIS — Z510 Encounter for antineoplastic radiation therapy: Secondary | ICD-10-CM | POA: Diagnosis not present

## 2022-08-31 DIAGNOSIS — Z170 Estrogen receptor positive status [ER+]: Secondary | ICD-10-CM | POA: Diagnosis not present

## 2022-08-31 LAB — RAD ONC ARIA SESSION SUMMARY
Course Elapsed Days: 14
Plan Fractions Treated to Date: 12
Plan Prescribed Dose Per Fraction: 2.66 Gy
Plan Total Fractions Prescribed: 16
Plan Total Prescribed Dose: 42.56 Gy
Reference Point Dosage Given to Date: 31.92 Gy
Reference Point Session Dosage Given: 2.66 Gy
Session Number: 12

## 2022-09-01 ENCOUNTER — Ambulatory Visit
Admission: RE | Admit: 2022-09-01 | Discharge: 2022-09-01 | Disposition: A | Discharge status: Home / Self Care | Payer: PPO | Source: Ambulatory Visit | Attending: Radiation Oncology | Admitting: Radiation Oncology

## 2022-09-01 ENCOUNTER — Other Ambulatory Visit: Payer: Self-pay

## 2022-09-01 DIAGNOSIS — C50512 Malignant neoplasm of lower-outer quadrant of left female breast: Secondary | ICD-10-CM | POA: Diagnosis not present

## 2022-09-01 DIAGNOSIS — Z510 Encounter for antineoplastic radiation therapy: Secondary | ICD-10-CM | POA: Diagnosis not present

## 2022-09-01 DIAGNOSIS — Z170 Estrogen receptor positive status [ER+]: Secondary | ICD-10-CM | POA: Diagnosis not present

## 2022-09-01 LAB — RAD ONC ARIA SESSION SUMMARY
Course Elapsed Days: 15
Plan Fractions Treated to Date: 13
Plan Prescribed Dose Per Fraction: 2.66 Gy
Plan Total Fractions Prescribed: 16
Plan Total Prescribed Dose: 42.56 Gy
Reference Point Dosage Given to Date: 34.58 Gy
Reference Point Session Dosage Given: 2.66 Gy
Session Number: 13

## 2022-09-06 ENCOUNTER — Other Ambulatory Visit: Payer: Self-pay

## 2022-09-06 ENCOUNTER — Ambulatory Visit
Admission: RE | Admit: 2022-09-06 | Discharge: 2022-09-06 | Disposition: A | Discharge status: Home / Self Care | Payer: PPO | Source: Ambulatory Visit | Attending: Radiation Oncology | Admitting: Radiation Oncology

## 2022-09-06 DIAGNOSIS — Z170 Estrogen receptor positive status [ER+]: Secondary | ICD-10-CM | POA: Diagnosis not present

## 2022-09-06 DIAGNOSIS — C50512 Malignant neoplasm of lower-outer quadrant of left female breast: Secondary | ICD-10-CM | POA: Diagnosis not present

## 2022-09-06 DIAGNOSIS — Z510 Encounter for antineoplastic radiation therapy: Secondary | ICD-10-CM | POA: Diagnosis not present

## 2022-09-06 LAB — RAD ONC ARIA SESSION SUMMARY
Course Elapsed Days: 20
Plan Fractions Treated to Date: 14
Plan Prescribed Dose Per Fraction: 2.66 Gy
Plan Total Fractions Prescribed: 16
Plan Total Prescribed Dose: 42.56 Gy
Reference Point Dosage Given to Date: 37.24 Gy
Reference Point Session Dosage Given: 2.66 Gy
Session Number: 14

## 2022-09-07 ENCOUNTER — Ambulatory Visit: Payer: PPO | Admitting: Radiation Oncology

## 2022-09-07 ENCOUNTER — Encounter: Payer: Self-pay | Admitting: Radiation Oncology

## 2022-09-07 ENCOUNTER — Other Ambulatory Visit: Payer: Self-pay

## 2022-09-07 ENCOUNTER — Ambulatory Visit
Admission: RE | Admit: 2022-09-07 | Discharge: 2022-09-07 | Disposition: A | Discharge status: Home / Self Care | Payer: PPO | Source: Ambulatory Visit | Attending: Radiation Oncology | Admitting: Radiation Oncology

## 2022-09-07 DIAGNOSIS — Z510 Encounter for antineoplastic radiation therapy: Secondary | ICD-10-CM | POA: Diagnosis not present

## 2022-09-07 DIAGNOSIS — C50512 Malignant neoplasm of lower-outer quadrant of left female breast: Secondary | ICD-10-CM | POA: Diagnosis not present

## 2022-09-07 DIAGNOSIS — Z170 Estrogen receptor positive status [ER+]: Secondary | ICD-10-CM | POA: Diagnosis not present

## 2022-09-07 LAB — RAD ONC ARIA SESSION SUMMARY
Course Elapsed Days: 21
Plan Fractions Treated to Date: 15
Plan Prescribed Dose Per Fraction: 2.66 Gy
Plan Total Fractions Prescribed: 16
Plan Total Prescribed Dose: 42.56 Gy
Reference Point Dosage Given to Date: 39.9 Gy
Reference Point Session Dosage Given: 2.66 Gy
Session Number: 15

## 2022-09-07 NOTE — Progress Notes (Signed)
I received a call from the on-call nurse stating that Ms. Carol Harris called in reporting a red, swollen, painful nipple. No fevers. I asked the nurse to instruct the patient to come in 30 minutes early tomorrow so that she can be assessed by Dr. Lisbeth Renshaw or his PA prior to radiation treatment.  -----------------------------------  Eppie Gibson, MD

## 2022-09-08 ENCOUNTER — Ambulatory Visit
Admission: RE | Admit: 2022-09-08 | Discharge: 2022-09-08 | Disposition: A | Discharge status: Home / Self Care | Payer: PPO | Source: Ambulatory Visit | Attending: Radiation Oncology | Admitting: Radiation Oncology

## 2022-09-08 ENCOUNTER — Other Ambulatory Visit: Payer: Self-pay

## 2022-09-08 DIAGNOSIS — Z510 Encounter for antineoplastic radiation therapy: Secondary | ICD-10-CM | POA: Diagnosis not present

## 2022-09-08 DIAGNOSIS — Z170 Estrogen receptor positive status [ER+]: Secondary | ICD-10-CM | POA: Diagnosis not present

## 2022-09-08 DIAGNOSIS — C50512 Malignant neoplasm of lower-outer quadrant of left female breast: Secondary | ICD-10-CM | POA: Diagnosis not present

## 2022-09-08 LAB — RAD ONC ARIA SESSION SUMMARY
Course Elapsed Days: 22
Plan Fractions Treated to Date: 16
Plan Prescribed Dose Per Fraction: 2.66 Gy
Plan Total Fractions Prescribed: 16
Plan Total Prescribed Dose: 42.56 Gy
Reference Point Dosage Given to Date: 42.56 Gy
Reference Point Session Dosage Given: 2.66 Gy
Session Number: 16

## 2022-09-09 ENCOUNTER — Ambulatory Visit: Payer: PPO

## 2022-09-09 ENCOUNTER — Other Ambulatory Visit: Payer: Self-pay

## 2022-09-09 ENCOUNTER — Ambulatory Visit
Admission: RE | Admit: 2022-09-09 | Discharge: 2022-09-09 | Disposition: A | Discharge status: Home / Self Care | Payer: PPO | Source: Ambulatory Visit | Attending: Radiation Oncology | Admitting: Radiation Oncology

## 2022-09-09 DIAGNOSIS — C50512 Malignant neoplasm of lower-outer quadrant of left female breast: Secondary | ICD-10-CM | POA: Diagnosis not present

## 2022-09-09 DIAGNOSIS — Z510 Encounter for antineoplastic radiation therapy: Secondary | ICD-10-CM | POA: Diagnosis not present

## 2022-09-09 DIAGNOSIS — Z170 Estrogen receptor positive status [ER+]: Secondary | ICD-10-CM | POA: Diagnosis not present

## 2022-09-09 LAB — RAD ONC ARIA SESSION SUMMARY
Course Elapsed Days: 23
Plan Fractions Treated to Date: 1
Plan Prescribed Dose Per Fraction: 2 Gy
Plan Total Fractions Prescribed: 4
Plan Total Prescribed Dose: 8 Gy
Reference Point Dosage Given to Date: 2 Gy
Reference Point Session Dosage Given: 2 Gy
Session Number: 17

## 2022-09-10 ENCOUNTER — Ambulatory Visit: Payer: PPO

## 2022-09-10 ENCOUNTER — Ambulatory Visit
Admission: RE | Admit: 2022-09-10 | Discharge: 2022-09-10 | Disposition: A | Discharge status: Home / Self Care | Payer: PPO | Source: Ambulatory Visit | Attending: Radiation Oncology | Admitting: Radiation Oncology

## 2022-09-10 ENCOUNTER — Other Ambulatory Visit: Payer: Self-pay

## 2022-09-10 DIAGNOSIS — C50512 Malignant neoplasm of lower-outer quadrant of left female breast: Secondary | ICD-10-CM | POA: Diagnosis not present

## 2022-09-10 DIAGNOSIS — Z170 Estrogen receptor positive status [ER+]: Secondary | ICD-10-CM | POA: Insufficient documentation

## 2022-09-10 LAB — RAD ONC ARIA SESSION SUMMARY
Course Elapsed Days: 24
Plan Fractions Treated to Date: 2
Plan Prescribed Dose Per Fraction: 2 Gy
Plan Total Fractions Prescribed: 4
Plan Total Prescribed Dose: 8 Gy
Reference Point Dosage Given to Date: 4 Gy
Reference Point Session Dosage Given: 2 Gy
Session Number: 18

## 2022-09-10 MED ORDER — RADIAPLEXRX EX GEL: Freq: Once | CUTANEOUS | Status: AC

## 2022-09-11 NOTE — Progress Notes (Signed)
Patient Care Team: Rita Ohara, MD as PCP - General (Family Medicine) Jovita Kussmaul, MD as Consulting Physician (General Surgery) Nicholas Lose, MD as Consulting Physician (Hematology and Oncology) Kyung Rudd, MD as Consulting Physician (Radiation Oncology) Rockwell Germany, RN as Oncology Nurse Navigator Mauro Kaufmann, RN as Oncology Nurse Navigator  DIAGNOSIS:  Encounter Diagnosis  Name Primary?   Malignant neoplasm of lower-outer quadrant of left breast of female, estrogen receptor positive ([place]) Yes    SUMMARY OF ONCOLOGIC HISTORY: Oncology History  Malignant neoplasm of lower-outer quadrant of left breast of female, estrogen receptor positive ([place])  06/16/2022 Initial Diagnosis   Screening mammogram detected left breast mass at 3 o'clock position 0.4 cm, axilla negative, ultrasound-guided biopsy revealed grade 1 IDC with lobular features with DCIS and calcifications ER 95% PR 80% HER2 negative, Ki-67 15%   06/16/2022 Cancer Staging   Staging form: Breast, AJCC 8th Edition - Clinical: Stage IA (cT1a, cN0, cM0, G1, ER+, PR+, HER2-) - Signed by Nicholas Lose, MD on 06/16/2022 Histologic grading system: 3 grade system    Genetic Testing   [facility] Multi-Cancer Panel was Negative. Of note, a variant of uncertain significance was detected in the [facility] gene. Report date is 03/16/2019.  The Multi-Cancer Panel offered by [employer] includes sequencing and/or deletion/duplication analysis of the following 84 genes:  AIP, ALK, APC, ATM, AXIN2, BAP1, BARD1, BLM, BMPR1A, BRCA1, BRCA2, BRIP1, CASR, CDC73, CDH1, CDK4, CDKN1B, CDKN1C, CDKN2A, CEBPA, CHEK2, CTNNA1, DICER1, DIS3L2, EGFR, EPCAM, FH, FLCN, GATA2, GPC3, GREM1, HOXB13, HRAS, KIT, MAX, MEN1, MET, MITF, MLH1, MSH2, MSH3, MSH6, MUTYH, NBN, NF1, NF2, NTHL1, PALB2, PDGFRA, PHOX2B, PMS2, POLD1, POLE, POT1, PRKAR1A, PTCH1, PTEN, RAD50, RAD51C, RAD51D, RB1, RECQL4, RET, RUNX1, SDHA, SDHAF2, SDHB, SDHC, SDHD, SMAD4, SMARCA4, SMARCB1, SMARCE1, STK11,  SUFU, TERC, TERT, TMEM127, TP53, TSC1, TSC2, VHL, WRN, and WT1.          CHIEF COMPLIANT: Follow-up after radiation  INTERVAL HISTORY: Carol Harris is a 66 y.o. with the above-mentioned. Left breast cancer. She presents to the clinic for a follow-up after radiation to discuss antiestrogen therapy. She states that surgery went well she says it is sore and has some radiation dermatitis.    ALLERGIES:  has No Known Allergies.  MEDICATIONS:  Current Outpatient Medications  Medication Sig Dispense Refill   acetaminophen (TYLENOL) 500 MG tablet Take 500 mg by mouth every 6 (six) hours as needed.     anastrozole (ARIMIDEX) 1 MG tablet Take 1 tablet (1 mg total) by mouth daily. 90 tablet 3   aspirin EC 81 MG tablet Take 81 mg by mouth daily.     atenolol (TENORMIN) 25 MG tablet TAKE 1 TABLET BY MOUTH TWICE A DAY 180 tablet 2   atorvastatin (LIPITOR) 40 MG tablet Take 1 tablet (40 mg total) by mouth daily. 90 tablet 3   buPROPion (WELLBUTRIN XL) 150 MG 24 hr tablet Take 450 mg by mouth every morning.     Cholecalciferol (VITAMIN D-3) 125 MCG (5000 UT) TABS Take 1 tablet by mouth 4 (four) times a week.     cyanocobalamin 100 MCG tablet Take 1,000 mcg by mouth daily.     gabapentin (NEURONTIN) 100 MG capsule Take 100 mg by mouth 3 (three) times daily.     losartan (COZAAR) 50 MG tablet TAKE 1 TABLET BY MOUTH EVERY DAY 90 tablet 2   VITAMIN B COMPLEX-C PO      No current facility-administered medications for this visit.    PHYSICAL EXAMINATION:  ECOG PERFORMANCE STATUS: 1 - Symptomatic but completely ambulatory  Vitals:   09/17/22 1026  BP: (!) 152/98  Pulse: 60  Resp: 13  Temp: 98.1 F (36.7 C)  SpO2: 100%   Filed Weights   09/17/22 1026  Weight: 160 lb 3.2 oz (72.7 kg)     LABORATORY DATA:  I have reviewed the data as listed    Latest Ref Rng & Units 06/16/2022    8:28 AM 05/20/2022    2:33 PM 01/21/2021    4:34 PM  CMP  Glucose 70 - 99 mg/dL 94  92  92   BUN 8 - 23  mg/dL _0 Creatinine 0.44 - 1.00 mg/dL 0.81  0.86  0.77   Sodium 135 - 145 mmol/L 135  133  132   Potassium 3.5 - 5.1 mmol/L 3.9  4.3  4.1   Chloride 98 - 111 mmol/L 101  94  95   CO2 22 - 32 mmol/L _1 Calcium 8.9 - 10.3 mg/dL 9.7  9.9  9.6   Total Protein 6.5 - 8.1 g/dL 7.6  8.1  7.4   Total Bilirubin 0.3 - 1.2 mg/dL 0.7  0.6  0.4   Alkaline Phos 38 - 126 U/L 63  82  76   AST 15 - 41 U/L _2 ALT 0 - 44 U/L _3 Lab Results  Component Value Date   WBC 4.8 06/16/2022   HGB 14.1 06/16/2022   HCT 40.5 06/16/2022   MCV 91.0 06/16/2022   PLT 234 06/16/2022   NEUTROABS 2.9 06/16/2022    ASSESSMENT & PLAN:  Malignant neoplasm of lower-outer quadrant of left breast of female, estrogen receptor positive ([place]) Screening mammogram detected left breast mass at 3 o'clock position 0.4 cm, axilla negative, ultrasound-guided biopsy revealed grade 1 IDC with lobular features with DCIS and calcifications ER 95% PR 80% HER2 negative, Ki-67 15% 06/25/2022: Left lumpectomy: Grade 2 IDC 1.7 cm, margins negative, 0/5 sentinel nodes negative ER 95%, PR 80%, HER2 negative, Ki-67 15% Oncotype DX score: 22: 8% risk of distant recurrence Adjuvant radiation completed 09/14/2022   Treatment plan: Adjuvant antiestrogen therapy with anastrozole 1 mg daily started 09/17/2022  Anastrozole counseling: We discussed the risks and benefits of anti-estrogen therapy with aromatase inhibitors. These include but not limited to insomnia, hot flashes, mood changes, vaginal dryness, bone density loss, and weight gain. We strongly believe that the benefits far outweigh the risks. Patient understands these risks and consented to starting treatment. Planned treatment duration is 5-7 years.  She's going to [place] for 2 weeks   Return to clinic in 3 months for survivorship care plan visit      No orders of the defined types were placed in this encounter.  The patient has a good  understanding of the overall plan. she agrees with it. she will call with any problems that may develop before the next visit here. Total time spent: 30 mins including face to face time and time spent for planning, charting and co-ordination of care   Harriette Ohara, MD 09/17/22    I Gardiner Coins am scribing for Dr. Lindi Adie  I have reviewed the above documentation for accuracy and completeness, and I agree with the above.

## 2022-09-13 ENCOUNTER — Ambulatory Visit: Payer: PPO

## 2022-09-13 ENCOUNTER — Ambulatory Visit
Admission: RE | Admit: 2022-09-13 | Discharge: 2022-09-13 | Disposition: A | Discharge status: Home / Self Care | Payer: PPO | Source: Ambulatory Visit | Attending: Radiation Oncology | Admitting: Radiation Oncology

## 2022-09-13 ENCOUNTER — Other Ambulatory Visit: Payer: Self-pay

## 2022-09-13 DIAGNOSIS — C50512 Malignant neoplasm of lower-outer quadrant of left female breast: Secondary | ICD-10-CM | POA: Diagnosis not present

## 2022-09-13 LAB — RAD ONC ARIA SESSION SUMMARY
Course Elapsed Days: 27
Plan Fractions Treated to Date: 3
Plan Prescribed Dose Per Fraction: 2 Gy
Plan Total Fractions Prescribed: 4
Plan Total Prescribed Dose: 8 Gy
Reference Point Dosage Given to Date: 6 Gy
Reference Point Session Dosage Given: 2 Gy
Session Number: 19

## 2022-09-13 NOTE — Progress Notes (Signed)
                                                                                                                                                             Patient Name: Carol Harris MRN: [id number] DOB: 22-Nov-1955 Referring Physician: Tomi Bamberger EVE (Profile Not Attached) Date of Service: 09/14/2022 [facility], [place]                                                        End Of Treatment Note  Diagnoses: C50.512-Malignant neoplasm of lower-outer quadrant of left female breast Z17.0-Estrogen receptor positive status [ER+]  Cancer Staging: Stage IA, pT1cN0M0, grade 2, ER/PR positive, invasive ductal carcinoma of the left breast.   Intent: Curative  Radiation Treatment Dates: 08/17/2022 through 09/14/2022 Site Technique Total Dose (Gy) Dose per Fx (Gy) Completed Fx Beam Energies  Breast, Left: Breast_L 3D 42.56/42.56 2.66 16/16 6XFFF  Breast, Left: Breast_L_Bst 3D 8/8 2 4/4 6X   Narrative: The patient tolerated radiation therapy relatively well. She developed fatigue and anticipated skin changes in the treatment field and along the nipple area.    Plan: The patient will receive a call in about one month from the radiation oncology department. She will continue follow up with Dr. Lindi Adie as well.   ________________________________________________    Carola Rhine, [facility]

## 2022-09-14 ENCOUNTER — Encounter: Payer: Self-pay | Admitting: Radiation Oncology

## 2022-09-14 ENCOUNTER — Ambulatory Visit: Payer: PPO

## 2022-09-14 ENCOUNTER — Other Ambulatory Visit: Payer: Self-pay

## 2022-09-14 ENCOUNTER — Encounter: Payer: Self-pay | Admitting: *Deleted

## 2022-09-14 ENCOUNTER — Ambulatory Visit
Admission: RE | Admit: 2022-09-14 | Discharge: 2022-09-14 | Disposition: A | Discharge status: Home / Self Care | Payer: PPO | Source: Ambulatory Visit | Attending: Radiation Oncology | Admitting: Radiation Oncology

## 2022-09-14 DIAGNOSIS — Z170 Estrogen receptor positive status [ER+]: Secondary | ICD-10-CM

## 2022-09-14 DIAGNOSIS — C50512 Malignant neoplasm of lower-outer quadrant of left female breast: Secondary | ICD-10-CM | POA: Diagnosis not present

## 2022-09-14 DIAGNOSIS — Z510 Encounter for antineoplastic radiation therapy: Secondary | ICD-10-CM | POA: Diagnosis not present

## 2022-09-14 LAB — RAD ONC ARIA SESSION SUMMARY
Course Elapsed Days: 28
Plan Fractions Treated to Date: 4
Plan Prescribed Dose Per Fraction: 2 Gy
Plan Total Fractions Prescribed: 4
Plan Total Prescribed Dose: 8 Gy
Reference Point Dosage Given to Date: 8 Gy
Reference Point Session Dosage Given: 2 Gy
Session Number: 20

## 2022-09-15 ENCOUNTER — Ambulatory Visit: Payer: PPO

## 2022-09-17 ENCOUNTER — Other Ambulatory Visit: Payer: Self-pay

## 2022-09-17 ENCOUNTER — Inpatient Hospital Stay: Payer: PPO | Attending: Hematology and Oncology | Admitting: Hematology and Oncology

## 2022-09-17 VITALS — BP 152/98 | HR 60 | Temp 98.1°F | Resp 13 | Wt 160.2 lb

## 2022-09-17 DIAGNOSIS — Z79899 Other long term (current) drug therapy: Secondary | ICD-10-CM | POA: Insufficient documentation

## 2022-09-17 DIAGNOSIS — Z170 Estrogen receptor positive status [ER+]: Secondary | ICD-10-CM | POA: Diagnosis not present

## 2022-09-17 DIAGNOSIS — C50512 Malignant neoplasm of lower-outer quadrant of left female breast: Secondary | ICD-10-CM | POA: Diagnosis not present

## 2022-09-17 DIAGNOSIS — Z79811 Long term (current) use of aromatase inhibitors: Secondary | ICD-10-CM | POA: Diagnosis not present

## 2022-09-17 MED ORDER — ANASTROZOLE 1 MG PO TABS: 1.0000 mg | ORAL_TABLET | Freq: Every day | ORAL | 3 refills | Status: DC

## 2022-09-17 NOTE — Assessment & Plan Note (Signed)
Screening mammogram detected left breast mass at 3 o'clock position 0.4 cm, axilla negative, ultrasound-guided biopsy revealed grade 1 IDC with lobular features with DCIS and calcifications ER 95% PR 80% HER2 negative, Ki-67 15% 06/25/2022: Left lumpectomy: Grade 2 IDC 1.7 cm, margins negative, 0/5 sentinel nodes negative ER 95%, PR 80%, HER2 negative, Ki-67 15% Oncotype DX score: 22: 8% risk of distant recurrence Adjuvant radiation completed 09/14/2022   Treatment plan: Adjuvant antiestrogen therapy with anastrozole 1 mg daily started 09/17/2022  Anastrozole counseling: We discussed the risks and benefits of anti-estrogen therapy with aromatase inhibitors. These include but not limited to insomnia, hot flashes, mood changes, vaginal dryness, bone density loss, and weight gain. We strongly believe that the benefits far outweigh the risks. Patient understands these risks and consented to starting treatment. Planned treatment duration is 5-7 years.  She's going to [place] for 2 weeks   Return to clinic in 3 months for survivorship care plan visit   

## 2022-09-27 ENCOUNTER — Telehealth: Payer: Self-pay | Admitting: *Deleted

## 2022-09-27 NOTE — Telephone Encounter (Signed)
Received call from pt with complaint of severe jaw and generalized tooth pain x2 days.  Pt denies recent injury or trauma and states she sees her dentist every 6 months with no issues.  Pt states the only change has been starting anastrozole.  Per MD pt needing to stop anastrozole x2 weeks and f/u in office.  Pt educated and verbalized understanding.

## 2022-10-11 NOTE — Progress Notes (Signed)
Patient Care Team: Rita Ohara, MD as PCP - General (Family Medicine) Jovita Kussmaul, MD as Consulting Physician (General Surgery) Nicholas Lose, MD as Consulting Physician (Hematology and Oncology) Kyung Rudd, MD as Consulting Physician (Radiation Oncology) Rockwell Germany, RN as Oncology Nurse Navigator Mauro Kaufmann, RN as Oncology Nurse Navigator  DIAGNOSIS:  Encounter Diagnosis  Name Primary?   Malignant neoplasm of lower-outer quadrant of left breast of female, estrogen receptor positive ([place]) Yes    SUMMARY OF ONCOLOGIC HISTORY: Oncology History  Malignant neoplasm of lower-outer quadrant of left breast of female, estrogen receptor positive ([place])  06/16/2022 Initial Diagnosis   Screening mammogram detected left breast mass at 3 o'clock position 0.4 cm, axilla negative, ultrasound-guided biopsy revealed grade 1 IDC with lobular features with DCIS and calcifications ER 95% PR 80% HER2 negative, Ki-67 15%   06/16/2022 Cancer Staging   Staging form: Breast, AJCC 8th Edition - Clinical: Stage IA (cT1a, cN0, cM0, G1, ER+, PR+, HER2-) - Signed by Nicholas Lose, MD on 06/16/2022 Histologic grading system: 3 grade system    Genetic Testing   [employer]ae Multi-Cancer Panel was Negative. Of note, a variant of uncertain significance was detected in the [facility] gene. Report date is 03/16/2019.  The Multi-Cancer Panel offered by [employer] includes sequencing and/or deletion/duplication analysis of the following 84 genes:  AIP, ALK, APC, ATM, AXIN2, BAP1, BARD1, BLM, BMPR1A, BRCA1, BRCA2, BRIP1, CASR, CDC73, CDH1, CDK4, CDKN1B, CDKN1C, CDKN2A, CEBPA, CHEK2, CTNNA1, DICER1, DIS3L2, EGFR, EPCAM, FH, FLCN, GATA2, GPC3, GREM1, HOXB13, HRAS, KIT, MAX, MEN1, MET, MITF, MLH1, MSH2, MSH3, MSH6, MUTYH, NBN, NF1, NF2, NTHL1, PALB2, PDGFRA, PHOX2B, PMS2, POLD1, POLE, POT1, PRKAR1A, PTCH1, PTEN, RAD50, RAD51C, RAD51D, RB1, RECQL4, RET, RUNX1, SDHA, SDHAF2, SDHB, SDHC, SDHD, SMAD4, SMARCA4, SMARCB1, SMARCE1, STK11,  SUFU, TERC, TERT, TMEM127, TP53, TSC1, TSC2, VHL, WRN, and WT1.          CHIEF COMPLIANT: Follow- up breast cancer   INTERVAL HISTORY: Carol Harris is a 67 y.o. with the above-mentioned left breast cancer. She presents to the clinic for a follow-up. She was having jaw pain from taking the anastrozole. She states that could not tolerate the anastrozole she she stop taking it to see if the pain was going to subside and it it did.   ALLERGIES:  has No Known Allergies.  MEDICATIONS:  Current Outpatient Medications  Medication Sig Dispense Refill   acetaminophen (TYLENOL) 500 MG tablet Take 500 mg by mouth every 6 (six) hours as needed.     aspirin EC 81 MG tablet Take 81 mg by mouth daily.     atenolol (TENORMIN) 25 MG tablet TAKE 1 TABLET BY MOUTH TWICE A DAY 180 tablet 2   atorvastatin (LIPITOR) 40 MG tablet Take 1 tablet (40 mg total) by mouth daily. 90 tablet 3   buPROPion (WELLBUTRIN XL) 150 MG 24 hr tablet Take 450 mg by mouth every morning.     Cholecalciferol (VITAMIN D-3) 125 MCG (5000 UT) TABS Take 1 tablet by mouth 4 (four) times a week.     cyanocobalamin 100 MCG tablet Take 1,000 mcg by mouth daily.     gabapentin (NEURONTIN) 100 MG capsule Take 100 mg by mouth 3 (three) times daily.     letrozole (FEMARA) 2.5 MG tablet Take 1 tablet (2.5 mg total) by mouth daily. 30 tablet 0   losartan (COZAAR) 50 MG tablet TAKE 1 TABLET BY MOUTH EVERY DAY 90 tablet 2   VITAMIN B COMPLEX-C PO  No current facility-administered medications for this visit.    PHYSICAL EXAMINATION: ECOG PERFORMANCE STATUS: 1 - Symptomatic but completely ambulatory  Vitals:   10/15/22 0815  BP: (!) 128/90  Pulse: (!) 58  Temp: 98.9 F (37.2 C)  SpO2: 100%   Filed Weights   10/15/22 0815  Weight: 162 lb 8 oz (73.7 kg)      LABORATORY DATA:  I have reviewed the data as listed    Latest Ref Rng & Units 06/16/2022    8:28 AM 05/20/2022    2:33 PM 01/21/2021    4:34 PM  CMP  Glucose 70 -  99 mg/dL 94  92  92   BUN 8 - 23 mg/dL _0 Creatinine 0.44 - 1.00 mg/dL 0.81  0.86  0.77   Sodium 135 - 145 mmol/L 135  133  132   Potassium 3.5 - 5.1 mmol/L 3.9  4.3  4.1   Chloride 98 - 111 mmol/L 101  94  95   CO2 22 - 32 mmol/L _1 Calcium 8.9 - 10.3 mg/dL 9.7  9.9  9.6   Total Protein 6.5 - 8.1 g/dL 7.6  8.1  7.4   Total Bilirubin 0.3 - 1.2 mg/dL 0.7  0.6  0.4   Alkaline Phos 38 - 126 U/L 63  82  76   AST 15 - 41 U/L _2 ALT 0 - 44 U/L _3 Lab Results  Component Value Date   WBC 4.8 06/16/2022   HGB 14.1 06/16/2022   HCT 40.5 06/16/2022   MCV 91.0 06/16/2022   PLT 234 06/16/2022   NEUTROABS 2.9 06/16/2022    ASSESSMENT & PLAN:  Malignant neoplasm of lower-outer quadrant of left breast of female, estrogen receptor positive ([place]) Screening mammogram detected left breast mass at 3 o'clock position 0.4 cm, axilla negative, ultrasound-guided biopsy revealed grade 1 IDC with lobular features with DCIS and calcifications ER 95% PR 80% HER2 negative, Ki-67 15% 06/25/2022: Left lumpectomy: Grade 2 IDC 1.7 cm, margins negative, 0/5 sentinel nodes negative ER 95%, PR 80%, HER2 negative, Ki-67 15% Oncotype DX score: 22: 8% risk of distant recurrence Adjuvant radiation completed 09/14/2022   Treatment plan: Adjuvant antiestrogen therapy with anastrozole 1 mg daily started 09/17/2022 discontinued 09/29/2022   Anastrozole toxicities: Severe jaw pain: Discontinued anastrozole and the jaw pain improved I recommended letrozole therapy and sent a 30-day supply for the patient.  Breast cancer surveillance: Breast exam 10/15/2022: Benign Mammogram will be done in August 2024.  Telephone visit in 1 month to discuss tolerance to letrozole therapy.    No orders of the defined types were placed in this encounter.  The patient has a good understanding of the overall plan. she agrees with it. she will call with any problems that may develop before the next  visit here. Total time spent: 30 mins including face to face time and time spent for planning, charting and co-ordination of care   Harriette Ohara, MD 10/15/22    I Gardiner Coins am acting as a Education administrator for [employer]  I have reviewed the above documentation for accuracy and completeness, and I agree with the above.

## 2022-10-13 DIAGNOSIS — F411 Generalized anxiety disorder: Secondary | ICD-10-CM | POA: Diagnosis not present

## 2022-10-13 DIAGNOSIS — F332 Major depressive disorder, recurrent severe without psychotic features: Secondary | ICD-10-CM | POA: Diagnosis not present

## 2022-10-13 DIAGNOSIS — F900 Attention-deficit hyperactivity disorder, predominantly inattentive type: Secondary | ICD-10-CM | POA: Diagnosis not present

## 2022-10-15 ENCOUNTER — Other Ambulatory Visit: Payer: Self-pay

## 2022-10-15 ENCOUNTER — Inpatient Hospital Stay: Payer: PPO | Attending: Hematology and Oncology | Admitting: Hematology and Oncology

## 2022-10-15 VITALS — BP 128/90 | HR 58 | Temp 98.9°F | Wt 162.5 lb

## 2022-10-15 DIAGNOSIS — N6325 Unspecified lump in the left breast, overlapping quadrants: Secondary | ICD-10-CM | POA: Diagnosis not present

## 2022-10-15 DIAGNOSIS — Z79811 Long term (current) use of aromatase inhibitors: Secondary | ICD-10-CM | POA: Diagnosis not present

## 2022-10-15 DIAGNOSIS — Z79899 Other long term (current) drug therapy: Secondary | ICD-10-CM | POA: Diagnosis not present

## 2022-10-15 DIAGNOSIS — Z170 Estrogen receptor positive status [ER+]: Secondary | ICD-10-CM | POA: Insufficient documentation

## 2022-10-15 DIAGNOSIS — R6884 Jaw pain: Secondary | ICD-10-CM | POA: Insufficient documentation

## 2022-10-15 DIAGNOSIS — C50512 Malignant neoplasm of lower-outer quadrant of left female breast: Secondary | ICD-10-CM | POA: Diagnosis not present

## 2022-10-15 MED ORDER — LETROZOLE 2.5 MG PO TABS: 2.5000 mg | ORAL_TABLET | Freq: Every day | ORAL | 0 refills | Status: DC

## 2022-10-15 NOTE — Assessment & Plan Note (Addendum)
Screening mammogram detected left breast mass at 3 o'clock position 0.4 cm, axilla negative, ultrasound-guided biopsy revealed grade 1 IDC with lobular features with DCIS and calcifications ER 95% PR 80% HER2 negative, Ki-67 15% 06/25/2022: Left lumpectomy: Grade 2 IDC 1.7 cm, margins negative, 0/5 sentinel nodes negative ER 95%, PR 80%, HER2 negative, Ki-67 15% Oncotype DX score: 22: 8% risk of distant recurrence Adjuvant radiation completed 09/14/2022   Treatment plan: Adjuvant antiestrogen therapy with anastrozole 1 mg daily started 09/17/2022 discontinued 09/29/2022   Anastrozole toxicities: Severe jaw pain: Discontinued anastrozole and the jaw pain improved I recommended letrozole therapy and sent a 30-day supply for the patient.  Breast cancer surveillance: Breast exam 10/15/2022: Benign Mammogram will be done in August 2024.  Telephone visit in 1 month to discuss tolerance to letrozole therapy.

## 2022-11-06 ENCOUNTER — Other Ambulatory Visit: Payer: Self-pay | Admitting: Hematology and Oncology

## 2022-11-10 ENCOUNTER — Telehealth: Payer: Self-pay | Admitting: Family Medicine

## 2022-11-10 NOTE — Telephone Encounter (Signed)
[place] for new order.  Just a copy of this note may not suffice, if they require a face to face visit. The last time we discussed this was at her physical in August.
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
## 2022-11-10 NOTE — Telephone Encounter (Signed)
Wanted to okay this with you.

## 2022-11-11 NOTE — Telephone Encounter (Signed)
done

## 2022-11-11 NOTE — Telephone Encounter (Signed)
Done

## 2022-11-12 ENCOUNTER — Inpatient Hospital Stay: Payer: PPO | Attending: Hematology and Oncology | Admitting: Hematology and Oncology

## 2022-11-12 ENCOUNTER — Other Ambulatory Visit: Payer: Self-pay

## 2022-11-12 VITALS — BP 117/79 | HR 56 | Temp 97.6°F | Resp 17 | Wt 159.1 lb

## 2022-11-12 DIAGNOSIS — C50512 Malignant neoplasm of lower-outer quadrant of left female breast: Secondary | ICD-10-CM | POA: Diagnosis not present

## 2022-11-12 DIAGNOSIS — N6325 Unspecified lump in the left breast, overlapping quadrants: Secondary | ICD-10-CM | POA: Diagnosis not present

## 2022-11-12 DIAGNOSIS — M79602 Pain in left arm: Secondary | ICD-10-CM | POA: Diagnosis not present

## 2022-11-12 DIAGNOSIS — Z170 Estrogen receptor positive status [ER+]: Secondary | ICD-10-CM | POA: Diagnosis not present

## 2022-11-12 DIAGNOSIS — Z79811 Long term (current) use of aromatase inhibitors: Secondary | ICD-10-CM | POA: Diagnosis not present

## 2022-11-12 DIAGNOSIS — Z79899 Other long term (current) drug therapy: Secondary | ICD-10-CM | POA: Insufficient documentation

## 2022-11-12 MED ORDER — LETROZOLE 2.5 MG PO TABS: 2.5000 mg | ORAL_TABLET | Freq: Every day | ORAL | 3 refills | Status: DC

## 2022-11-12 NOTE — Progress Notes (Signed)
Patient Care Team: Rita Ohara, MD as PCP - General (Family Medicine) Jovita Kussmaul, MD as Consulting Physician (General Surgery) Nicholas Lose, MD as Consulting Physician (Hematology and Oncology) Kyung Rudd, MD as Consulting Physician (Radiation Oncology) Rockwell Germany, RN as Oncology Nurse Navigator Mauro Kaufmann, RN as Oncology Nurse Navigator  DIAGNOSIS:  Encounter Diagnosis  Name Primary?   Malignant neoplasm of lower-outer quadrant of left breast of female, estrogen receptor positive ([place]) Yes    SUMMARY OF ONCOLOGIC HISTORY: Oncology History  Malignant neoplasm of lower-outer quadrant of left breast of female, estrogen receptor positive ([place])  06/16/2022 Initial Diagnosis   Screening mammogram detected left breast mass at 3 o'clock position 0.4 cm, axilla negative, ultrasound-guided biopsy revealed grade 1 IDC with lobular features with DCIS and calcifications ER 95% PR 80% HER2 negative, Ki-67 15%   06/16/2022 Cancer Staging   Staging form: Breast, AJCC 8th Edition - Clinical: Stage IA (cT1a, cN0, cM0, G1, ER+, PR+, HER2-) - Signed by Nicholas Lose, MD on 06/16/2022 Histologic grading system: 3 grade system    Genetic Testing   Inv[employer]ae Multi-Cancer Panel was Negative. Of note, a variant of uncertain significance was detected in the [facility] gene. Report date is 03/16/2019.  The Multi-Cancer Panel offered by [employer] includes sequencing and/or deletion/duplication analysis of the following 84 genes:  AIP, ALK, APC, ATM, AXIN2, BAP1, BARD1, BLM, BMPR1A, BRCA1, BRCA2, BRIP1, CASR, CDC73, CDH1, CDK4, CDKN1B, CDKN1C, CDKN2A, CEBPA, CHEK2, CTNNA1, DICER1, DIS3L2, EGFR, EPCAM, FH, FLCN, GATA2, GPC3, GREM1, HOXB13, HRAS, KIT, MAX, MEN1, MET, MITF, MLH1, MSH2, MSH3, MSH6, MUTYH, NBN, NF1, NF2, NTHL1, PALB2, PDGFRA, PHOX2B, PMS2, POLD1, POLE, POT1, PRKAR1A, PTCH1, PTEN, RAD50, RAD51C, RAD51D, RB1, RECQL4, RET, RUNX1, SDHA, SDHAF2, SDHB, SDHC, SDHD, SMAD4, SMARCA4, SMARCB1, SMARCE1, STK11,  SUFU, TERC, TERT, TMEM127, TP53, TSC1, TSC2, VHL, WRN, and WT1.          CHIEF COMPLIANT:  Follow- up breast cancer    INTERVAL HISTORY: Carol Harris is a 67 y.o. with the above-mentioned left breast cancer. Currently on letrozole. She presents to the clinic for a follow-up. She reports that she she is tolerating the letrozole exteremly well. She complains of left arm she says it's painful for her to move it back.   ALLERGIES:  has No Known Allergies.  MEDICATIONS:  Current Outpatient Medications  Medication Sig Dispense Refill   acetaminophen (TYLENOL) 500 MG tablet Take 500 mg by mouth every 6 (six) hours as needed.     aspirin EC 81 MG tablet Take 81 mg by mouth daily.     atenolol (TENORMIN) 25 MG tablet TAKE 1 TABLET BY MOUTH TWICE A DAY 180 tablet 2   atorvastatin (LIPITOR) 40 MG tablet Take 1 tablet (40 mg total) by mouth daily. 90 tablet 3   buPROPion (WELLBUTRIN XL) 150 MG 24 hr tablet Take 450 mg by mouth every morning.     Cholecalciferol (VITAMIN D-3) 125 MCG (5000 UT) TABS Take 1 tablet by mouth 4 (four) times a week.     cyanocobalamin 100 MCG tablet Take 1,000 mcg by mouth daily.     gabapentin (NEURONTIN) 100 MG capsule Take 100 mg by mouth at bedtime.     letrozole (FEMARA) 2.5 MG tablet Take 1 tablet (2.5 mg total) by mouth daily. 90 tablet 3   losartan (COZAAR) 50 MG tablet TAKE 1 TABLET BY MOUTH EVERY DAY 90 tablet 2   VITAMIN B COMPLEX-C PO      No current facility-administered medications  for this visit.    PHYSICAL EXAMINATION: ECOG PERFORMANCE STATUS: 1 - Symptomatic but completely ambulatory  Vitals:   11/12/22 0853  BP: 117/79  Pulse: (!) 56  Resp: 17  Temp: 97.6 F (36.4 C)  SpO2: 100%   Filed Weights   11/12/22 0853  Weight: 159 lb 2 oz (72.2 kg)      LABORATORY DATA:  I have reviewed the data as listed    Latest Ref Rng & Units 06/16/2022    8:28 AM 05/20/2022    2:33 PM 01/21/2021    4:34 PM  CMP  Glucose 70 - 99 mg/dL 94  92   92   BUN 8 - 23 mg/dL '10  8  13   '$ Creatinine 0.44 - 1.00 mg/dL 0.81  0.86  0.77   Sodium 135 - 145 mmol/L 135  133  132   Potassium 3.5 - 5.1 mmol/L 3.9  4.3  4.1   Chloride 98 - 111 mmol/L 101  94  95   CO2 22 - 32 mmol/L '28  24  21   '$ Calcium 8.9 - 10.3 mg/dL 9.7  9.9  9.6   Total Protein 6.5 - 8.1 g/dL 7.6  8.1  7.4   Total Bilirubin 0.3 - 1.2 mg/dL 0.7  0.6  0.4   Alkaline Phos 38 - 126 U/L 63  82  76   AST 15 - 41 U/L '17  21  19   '$ ALT 0 - 44 U/L '17  22  19     '$ Lab Results  Component Value Date   WBC 4.8 06/16/2022   HGB 14.1 06/16/2022   HCT 40.5 06/16/2022   MCV 91.0 06/16/2022   PLT 234 06/16/2022   NEUTROABS 2.9 06/16/2022    ASSESSMENT & PLAN:  Malignant neoplasm of lower-outer quadrant of left breast of female, estrogen receptor positive ([place]) Screening mammogram detected left breast mass at 3 o'clock position 0.4 cm, axilla negative, ultrasound-guided biopsy revealed grade 1 IDC with lobular features with DCIS and calcifications ER 95% PR 80% HER2 negative, Ki-67 15% 06/25/2022: Left lumpectomy: Grade 2 IDC 1.7 cm, margins negative, 0/5 sentinel nodes negative ER 95%, PR 80%, HER2 negative, Ki-67 15% Oncotype DX score: 22: 8% risk of distant recurrence Adjuvant radiation completed 09/14/2022   Treatment plan: Adjuvant antiestrogen therapy with anastrozole 1 mg daily started 09/17/2022 discontinued 09/29/2022 (severe jaw pain)   Letrozole toxicities: Occ loose stools (patient usually has constipation) denies any jaw pain or bone pain.   Breast cancer surveillance: Breast exam 10/15/2022: Benign Mammogram will be done in August 2024.   Left arm range of motion issues: I will refer her to physical therapy for evaluation and treatment.  She is in a lot of discomfort if she exercises her arm.  She has survivorship care visit appointment.  I will see her after that in 6 months.  I sent a new prescription for 90-day supply of letrozole.   Orders Placed This Encounter   Procedures   Ambulatory referral to Physical Therapy    Referral Priority:   Routine    Referral Type:   Physical Medicine    Referral Reason:   Specialty Services Required    Requested Specialty:   Physical Therapy    Number of Visits Requested:   1   The patient has a good understanding of the overall plan. she agrees with it. she will call with any problems that may develop before the next visit here. Total time spent: 30  mins including face to face time and time spent for planning, charting and co-ordination of care   Carol Ohara, MD 11/12/22    I Gardiner Coins am acting as a Education administrator for Dr.Desarae Placide  I have reviewed the above documentation for accuracy and completeness, and I agree with the above.

## 2022-11-12 NOTE — Assessment & Plan Note (Addendum)
Screening mammogram detected left breast mass at 3 o'clock position 0.4 cm, axilla negative, ultrasound-guided biopsy revealed grade 1 IDC with lobular features with DCIS and calcifications ER 95% PR 80% HER2 negative, Ki-67 15% 06/25/2022: Left lumpectomy: Grade 2 IDC 1.7 cm, margins negative, 0/5 sentinel nodes negative ER 95%, PR 80%, HER2 negative, Ki-67 15% Oncotype DX score: 22: 8% risk of distant recurrence Adjuvant radiation completed 09/14/2022   Treatment plan: Adjuvant antiestrogen therapy with anastrozole 1 mg daily started 09/17/2022 discontinued 09/29/2022 (severe jaw pain)   Letrozole toxicities: Occ loose stools (patient usually has constipation) denies any jaw pain or bone pain.   Breast cancer surveillance: Breast exam 10/15/2022: Benign Mammogram will be done in August 2024.   Telephone visit in 1 month to discuss tolerance to letrozole therapy.

## 2022-11-19 ENCOUNTER — Other Ambulatory Visit: Payer: Self-pay

## 2022-11-19 ENCOUNTER — Ambulatory Visit: Payer: PPO | Attending: Hematology and Oncology | Admitting: Physical Therapy

## 2022-11-19 ENCOUNTER — Encounter: Payer: Self-pay | Admitting: Physical Therapy

## 2022-11-19 DIAGNOSIS — M25512 Pain in left shoulder: Secondary | ICD-10-CM | POA: Insufficient documentation

## 2022-11-19 DIAGNOSIS — Z170 Estrogen receptor positive status [ER+]: Secondary | ICD-10-CM | POA: Diagnosis not present

## 2022-11-19 DIAGNOSIS — M25612 Stiffness of left shoulder, not elsewhere classified: Secondary | ICD-10-CM

## 2022-11-19 DIAGNOSIS — C50512 Malignant neoplasm of lower-outer quadrant of left female breast: Secondary | ICD-10-CM | POA: Insufficient documentation

## 2022-11-19 DIAGNOSIS — Z483 Aftercare following surgery for neoplasm: Secondary | ICD-10-CM | POA: Diagnosis not present

## 2022-11-19 DIAGNOSIS — M6281 Muscle weakness (generalized): Secondary | ICD-10-CM

## 2022-11-19 DIAGNOSIS — R293 Abnormal posture: Secondary | ICD-10-CM | POA: Diagnosis not present

## 2022-11-19 NOTE — Therapy (Signed)
OUTPATIENT PHYSICAL THERAPY ONCOLOGY EVALUATION  Patient Name: Carol Harris MRN: [id number] DOB:1956-08-31, 67 y.o., female Today's Date: 11/19/2022  END OF SESSION:  PT End of Session - 11/19/22 0904     Visit Number 1    Number of Visits 9    Date for PT Re-Evaluation 12/20/22    Authorization Type no auth    PT Start Time 0802    PT Stop Time 0904    PT Time Calculation (min) 62 min    Activity Tolerance Patient tolerated treatment well    Behavior During Therapy [facility] for tasks assessed/performed             Past Medical History:  Diagnosis Date   Anxiety    Colon polyp    GERD (gastroesophageal reflux disease)    HTN (hypertension), benign 09/05/2011   Hyperlipidemia, mixed 09/05/2011   Leukopenia 09/05/2011   Sleep apnea    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   no defecits   Past Surgical History:  Procedure Laterality Date   BREAST LUMPECTOMY WITH RADIOACTIVE SEED AND SENTINEL LYMPH NODE BIOPSY Left 06/25/2022   Procedure: LEFT BREAST LUMPECTOMY WITH RADIOACTIVE SEED AND SENTINEL LYMPH NODE BIOPSY;  Surgeon: Jovita Kussmaul, MD;  Location: [place];  Service: General;  Laterality: Left;   KNEE ARTHROSCOPY Right 2005 approx   torn meniscus   TUBAL LIGATION     twice (first failed)   uterine ablation     Patient Active Problem List   Diagnosis Date Noted   Malignant neoplasm of lower-outer quadrant of left breast of female, estrogen receptor positive ([place]) 06/16/2022   Genetic testing 06/16/2022   Restless leg syndrome 11/08/2018   Left ventricular hypertrophy 11/08/2018   Depression 11/03/2018   Family history of colon cancer in mother 11/03/2018   History of vitamin D deficiency 11/03/2018   Night terrors, adult 11/03/2018   History of CVA (cerebrovascular accident) 07/12/2014   Obstructive sleep apnea 01/08/2014   GERD (gastroesophageal reflux disease) 12/19/2013   Leukopenia 09/05/2011   HTN (hypertension), benign 09/05/2011    Hyperlipidemia, mixed 09/05/2011    PCP: Rita Ohara, MD  REFERRING PROVIDER: Nicholas Lose, MD  REFERRING DIAG: C50.512,Z17.0 (ICD-10-CM) - Malignant neoplasm of lower-outer quadrant of left breast of female, estrogen receptor positive ([place])  THERAPY DIAG:  Stiffness of left shoulder, not elsewhere classified  Left shoulder pain, unspecified chronicity  Abnormal posture  Malignant neoplasm of lower-outer quadrant of left breast of female, estrogen receptor positive ([place])  Aftercare following surgery for neoplasm  Muscle weakness (generalized)  ONSET DATE: 06/25/2022  Rationale for Evaluation and Treatment: Rehabilitation  SUBJECTIVE:  SUBJECTIVE STATEMENT: pt states she can/t reach reach around to the back seat. And she can't do the exercises she has always done with her arm. She still has tingling and pain  in her upper arm.  She sometimes has to take a tylenol at night. She had been doing yoga   PERTINENT HISTORY: 06/25/2022: Left lumpectomy: Grade 2 IDC 1.7 cm, margins negative, 0/5 sentinel nodes negative ER 95%, PR 80%, HER2 negative, Ki-67 15%  Adjuvant radiation completed 09/14/2022 No chemo, but is on letrozole, pt did not tolerate anastrozole     PAIN:  Are you having pain? Yes NPRS scale: 4/10 Pain location: left shoulder  Pain orientation: Left and Lateral  PAIN TYPE: sharp and sometimes a cramp  Pain description: intermittent  Aggravating factors: reaching back and sometimes rasing arm  Relieving factors: massage, takes medicine, tries to move it in another direction   PRECAUTIONS: at risk for lymphedemaa   WEIGHT BEARING RESTRICTIONS: No  FALLS:  Has patient fallen in last 6 months? No  LIVING ENVIRONMENT: Lives with: liives with spouse, no pets  Lives in:  House/apartment Stairs: Yes; up to husband's study  Has following equipment at home: None  OCCUPATION: retired   Psychologist, clinical: travel, exercise , spend time with family   HAND DOMINANCE: right   PRIOR LEVEL OF FUNCTION: Independent  PATIENT GOALS: to get rid of shoulder pain and get back to exercising and get movement and strength back    OBJECTIVE:  COGNITION: Overall cognitive status: Within functional limits for tasks assessed   PALPATION: Firmness and pain in pec major with pinpoint tenderness at corocoid process at pec minor insertions. She also has palpable tightness in left upper trap, lat and scapular muscles.   OBSERVATIONS / OTHER ASSESSMENTS: scapular asymmetry in bilateral shoulder elevation .Pt has discoloration in left axilla from radiation and still has some bruising and fullness in left breast medical to nipple and scar.  She is wearing a light compression bra that she got from [employer].   SENSATION: appear WNL   POSTURE: forward head, increased thoracic curve, forward shoulder especially on left  UPPER EXTREMITY AROM/PROM:  A/PROM RIGHT   eval   Shoulder extension 55  Shoulder flexion 165  Shoulder abduction 180  Shoulder internal rotation 40  Shoulder external rotation 90    (Blank rows = not tested)  A/PROM LEFT   eval  Shoulder extension 55  Shoulder flexion 140  Shoulder abduction 150  Shoulder internal rotation 20* unable to hold arm up   Shoulder external rotation 80    (Blank rows = not tested)  CERVICAL AROM: All within normal limits:     UPPER EXTREMITY STRENGTH: limited by pain on the left especailly in shoulder abduction    LLYMPHEDEMA ASSESSMENTS:   SURGERY TYPE/DATE: left lumpectomy 06/25/2022 0/5 nodes   NUMBER OF LYMPH NODES REMOVED: 5  CHEMOTHERAPY: no  RADIATION:yes   HORMONE TREATMENT: yes  INFECTIONS: no  LYMPHEDEMA ASSESSMENTS:   LANDMARK RIGHT  eval  10 cm proximal to olecranon process 28.5  Olecranon  process 26  10 cm proximal to ulnar styloid process 19.5  Just proximal to ulnar styloid process 16  Across hand at thumb web space 19.5  At base of 2nd digit 6.2  (Blank rows = not tested)  LANDMARK LEFT  eval  10 cm proximal to olecranon process 28  Olecranon process 25.5  10 cm proximal to ulnar styloid process 19.5  Just proximal to ulnar styloid process 15.5  Across hand at thumb web space `9.5  At base of 2nd digit 6  (Blank rows = not tested)    QUICK DASH SURVEY:   Carol Harris - 11/19/22 0001     Open a tight or new jar Mild difficulty    Do heavy household chores (wash walls, wash floors) Mild difficulty    Carry a shopping bag or briefcase Mild difficulty    Wash your back Moderate difficulty    Use a knife to cut food Mild difficulty    Recreational activities in which you take some force or impact through your arm, shoulder, or hand (golf, hammering, tennis) Moderate difficulty    During the past week, to what extent has your arm, shoulder or hand problem interfered with your normal social activities with family, friends, neighbors, or groups? Slightly    During the past week, to what extent has your arm, shoulder or hand problem limited your work or other regular daily activities Slightly    Arm, shoulder, or hand pain. Moderate    Tingling (pins and needles) in your arm, shoulder, or hand Moderate    Difficulty Sleeping Moderate difficulty    DASH Score 36.36 %               TODAY'S TREATMENT:                                                                                                                                         DATE:11/19/2022 performed eval, instructed and performed Meeks decompression exercises and supine dowel shoulder flexion with knees bent to keep back against mat   PATIENT EDUCATION:  Education details: meeks decompression and supine dowel shoulder flexion  Person educated: Patient Education method: Consulting civil engineer, Media planner, and  Handouts Education comprehension: verbalized understanding and returned demonstration  HOME EXERCISE PROGRAM: 1. Decompression Exercise     Cancer Rehab    Lie on back on firm surface, knees bent, feet flat, arms turned up, out to sides, backs of hands down. Time _5-15__ minutes. Surface: floor   2. Shoulder Press    Start in Decompression Exercise position. Press shoulders downward towards supporting surface. Hold __2-3__ seconds while counting out loud. Repeat _3-5___ times. Do _1-2___ times per day.   3. Head Press    Bring cervical spine (neck) into neutral position (by either tucking the chin towards the chest or tilting the chin upward). Feel weight on back of head. Press head downward into supporting surface.    Hold _2-3__ seconds. Repeat _3-5__ times. Do _1-2__ times per day.   4. Leg Lengthener    Straighten one leg. Pull toes AND forefoot toward knee, extend heel. Lengthen leg by pulling pelvis away from ribs. Hold _2-3__ seconds. Relax. Repeat __4-6__ times. Do other leg.  Surface: floor   5. Leg Press    Straighten one leg down to floor keeping leg aligned with hip. Pull  toes AND forefoot toward knee; extend heel.  Press entire leg downward (as if pressing leg into sandy beach). DO NOT BEND KNEE. Hold _2-3__ seconds. Do __4-6__ times. Repeat with other leg.   SHOULDER: External Rotation - Supine (Cane)   SHOULDER: Flexion - Supine (Cane)        Cancer Rehab    Hold cane in both hands. Raise arms up overhead. Do not allow back to arch. Hold _5__ seconds. Do __5-10__ times; __1-2__ times a day.   ASSESSMENT:  CLINICAL IMPRESSION: Patient is a 67 y.o. female  who was seen today for physical therapy evaluation and treatment for left shoulder stiffness, pain and weakness that began after she was doing prone shoulder weight bearing exercises in yoga. She has tightness and pain in pec muscle groups, trapezius and scapular muscle groups and anterior and lateral  shoulder.  She has completed radiation about 2 months ago.  Feel that pt is having some tissue irritablilty from occasional impingement from weight bearing , reaching and overhead activity.  She will benefit from PT to decrease tissue irriability with iontophoresies and soft tissue work, and improve coordiation of muscle firing rwith strengthening exercises esulting in painfree range of motion with increased strength and function.   OBJECTIVE IMPAIRMENTS: decreased activity tolerance, decreased coordination, decreased endurance, decreased knowledge of condition, decreased knowledge of use of DME, decreased ROM, decreased strength, increased fascial restrictions, impaired perceived functional ability, increased muscle spasms, impaired UE functional use, postural dysfunction, and pain.   ACTIVITY LIMITATIONS: reaching behind her and reaching overhead   PARTICIPATION LIMITATIONS: possibly family obligations   PERSONAL FACTORS: recent surgery, radiation, on hormone blocker are also affecting patient's functional outcome.   REHAB POTENTIAL: Good  CLINICAL DECISION MAKING: Stable/uncomplicated  EVALUATION COMPLEXITY: Low  GOALS: Goals reviewed with patient? Yes  LONG  TERM GOALS: Target date: 12/20/2022  Pt will decrease QuickDASH to < 10 indicating improvement in left UE function  Baseline:36.35 Goal status: INITIAL  2.  Pt will reports she can reach to backseat of car without pain  Baseline: painful  Goal status: INITIAL  3.  Pt will reports she is indepenent in home program for shoulder ROM and strengthening  Baseline: needs review  Goal status: INITIAL  4.  Pt will increrase left shoulder abduction to 160 degrees without pain  Baseline: 150 and pain  Goal status: INITIAL  5.  Pt will be able to hold left arm up at 90 degrees of shoulder abduction without pain  Baseline: unable to hold arm up at 90 to test internal rotation due to pain  Goal status: INITIAL   PLAN:  PT  FREQUENCY: 2x/week  PT DURATION: 4 weeks  PLANNED INTERVENTIONS: Therapeutic exercises, Therapeutic activity, Neuromuscular re-education, Patient/Family education, Self Care, Joint mobilization, and Manual lymph drainage. Manual technques, iontophoresis, dry needling if pt does not improve with manual work   PLAN FOR NEXT SESSION: check to see if order for ionto is back, if so, consider application, do manual work to tight soft tissue at  pecs, upper traps.lats and scapular area.  PROM and stretching to left shoulder, progress home program.   Donato Heinz. Owens Shark, PT  Norwood Levo, PT 11/19/2022, 9:05 AM

## 2022-11-19 NOTE — Patient Instructions (Addendum)
1. Decompression Exercise     Cancer Rehab     Lie on back on firm surface, knees bent, feet flat, arms turned up, out to sides, backs of hands down. Time _5-15__ minutes. Surface: floor   2. Shoulder Press    Start in Decompression Exercise position. Press shoulders downward towards supporting surface. Hold __2-3__ seconds while counting out loud. Repeat _3-5___ times. Do _1-2___ times per day.   3. Head Press    Bring cervical spine (neck) into neutral position (by either tucking the chin towards the chest or tilting the chin upward). Feel weight on back of head. Press head downward into supporting surface.    Hold _2-3__ seconds. Repeat _3-5__ times. Do _1-2__ times per day.   4. Leg Lengthener    Straighten one leg. Pull toes AND forefoot toward knee, extend heel. Lengthen leg by pulling pelvis away from ribs. Hold _2-3__ seconds. Relax. Repeat __4-6__ times. Do other leg.  Surface: floor   5. Leg Press    Straighten one leg down to floor keeping leg aligned with hip. Pull toes AND forefoot toward knee; extend heel.  Press entire leg downward (as if pressing leg into sandy beach). DO NOT BEND KNEE. Hold _2-3__ seconds. Do __4-6__ times. Repeat with other leg.   SHOULDER: External Rotation - Supine (Cane)   SHOULDER: Flexion - Supine (Cane)        Cancer Rehab     Hold cane in both hands. Raise arms up overhead. Do not allow back to arch. Hold _5__ seconds. Do __5-10__ times; __1-2__ times a day.

## 2022-11-23 ENCOUNTER — Encounter: Payer: Self-pay | Admitting: Rehabilitation

## 2022-11-23 ENCOUNTER — Ambulatory Visit: Payer: PPO | Admitting: Rehabilitation

## 2022-11-23 DIAGNOSIS — Z170 Estrogen receptor positive status [ER+]: Secondary | ICD-10-CM

## 2022-11-23 DIAGNOSIS — Z483 Aftercare following surgery for neoplasm: Secondary | ICD-10-CM

## 2022-11-23 DIAGNOSIS — G4733 Obstructive sleep apnea (adult) (pediatric): Secondary | ICD-10-CM | POA: Diagnosis not present

## 2022-11-23 DIAGNOSIS — Z9189 Other specified personal risk factors, not elsewhere classified: Secondary | ICD-10-CM

## 2022-11-23 DIAGNOSIS — M6281 Muscle weakness (generalized): Secondary | ICD-10-CM

## 2022-11-23 DIAGNOSIS — M25612 Stiffness of left shoulder, not elsewhere classified: Secondary | ICD-10-CM

## 2022-11-23 DIAGNOSIS — R293 Abnormal posture: Secondary | ICD-10-CM

## 2022-11-23 DIAGNOSIS — M25512 Pain in left shoulder: Secondary | ICD-10-CM

## 2022-11-23 DIAGNOSIS — C50512 Malignant neoplasm of lower-outer quadrant of left female breast: Secondary | ICD-10-CM | POA: Diagnosis not present

## 2022-11-23 NOTE — Therapy (Signed)
OUTPATIENT PHYSICAL THERAPY ONCOLOGY TREATMENT  Patient Name: Carol Harris MRN: [id number] DOB:05/07/1956, 67 y.o., female Today's Date: 11/23/2022  END OF SESSION:  PT End of Session - 11/23/22 0958     Visit Number 2    Number of Visits 9    Date for PT Re-Evaluation 12/20/22    PT Start Time 1000    PT Stop Time 1054    PT Time Calculation (min) 54 min    Activity Tolerance Patient tolerated treatment well    Behavior During Therapy [facility] for tasks assessed/performed              Past Medical History:  Diagnosis Date   Anxiety    Colon polyp    GERD (gastroesophageal reflux disease)    HTN (hypertension), benign 09/05/2011   Hyperlipidemia, mixed 09/05/2011   Leukopenia 09/05/2011   Sleep apnea    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[place]) 12/19/2013   no defecits   Past Surgical History:  Procedure Laterality Date   BREAST LUMPECTOMY WITH RADIOACTIVE SEED AND SENTINEL LYMPH NODE BIOPSY Left 06/25/2022   Procedure: LEFT BREAST LUMPECTOMY WITH RADIOACTIVE SEED AND SENTINEL LYMPH NODE BIOPSY;  Surgeon: Jovita Kussmaul, MD;  Location: [place];  Service: General;  Laterality: Left;   KNEE ARTHROSCOPY Right 2005 approx   torn meniscus   TUBAL LIGATION     twice (first failed)   uterine ablation     Patient Active Problem List   Diagnosis Date Noted   Malignant neoplasm of lower-outer quadrant of left breast of female, estrogen receptor positive ([place]) 06/16/2022   Genetic testing 06/16/2022   Restless leg syndrome 11/08/2018   Left ventricular hypertrophy 11/08/2018   Depression 11/03/2018   Family history of colon cancer in mother 11/03/2018   History of vitamin D deficiency 11/03/2018   Night terrors, adult 11/03/2018   History of CVA (cerebrovascular accident) 07/12/2014   Obstructive sleep apnea 01/08/2014   GERD (gastroesophageal reflux disease) 12/19/2013   Leukopenia 09/05/2011   HTN (hypertension), benign 09/05/2011   Hyperlipidemia, mixed  09/05/2011    PCP: Rita Ohara, MD  REFERRING PROVIDER: Nicholas Lose, MD  REFERRING DIAG: C50.512,Z17.0 (ICD-10-CM) - Malignant neoplasm of lower-outer quadrant of left breast of female, estrogen receptor positive ([place])  THERAPY DIAG:  Stiffness of left shoulder, not elsewhere classified  Left shoulder pain, unspecified chronicity  Abnormal posture  Malignant neoplasm of lower-outer quadrant of left breast of female, estrogen receptor positive ([place])  Aftercare following surgery for neoplasm  Muscle weakness (generalized)  At risk for lymphedema  ONSET DATE: 06/25/2022  Rationale for Evaluation and Treatment: Rehabilitation  SUBJECTIVE:  SUBJECTIVE STATEMENT:  I have been doing my exercises.   EVAL: pt states she can/t reach reach around to the back seat. And she can't do the exercises she has always done with her arm. She still has tingling and pain  in her upper arm.  She sometimes has to take a tylenol at night. She had been doing yoga   PERTINENT HISTORY: 06/25/2022: Left lumpectomy: Grade 2 IDC 1.7 cm, margins negative, 0/5 sentinel nodes negative ER 95%, PR 80%, HER2 negative, Ki-67 15%  Adjuvant radiation completed 09/14/2022 No chemo, but is on letrozole, pt did not tolerate anastrozole     PAIN:  Are you having pain? Yes NPRS scale: 2/10 at rest Pain location: left shoulder  Pain orientation: Left and Lateral  PAIN TYPE: sharp and sometimes a cramp  Pain description: intermittent  Aggravating factors: reaching back and sometimes rasing arm  Relieving factors: massage, takes medicine, tries to move it in another direction   PRECAUTIONS: at risk for lymphedemaa   WEIGHT BEARING RESTRICTIONS: No  FALLS:  Has patient fallen in last 6 months? No  LIVING ENVIRONMENT: Lives with:  liives with spouse, no pets  Lives in: House/apartment Stairs: Yes; up to husband's study  Has following equipment at home: None  OCCUPATION: retired   Psychologist, clinical: travel, exercise , spend time with family   PATIENT GOALS: to get rid of shoulder pain and get back to exercising and get movement and strength back    OBJECTIVE: PALPATION:  EVAL: Firmness and pain in pec major with pinpoint tenderness at corocoid process at pec minor insertions. She also has palpable tightness in left upper trap, lat and scapular muscles.   OBSERVATIONS / OTHER ASSESSMENTS:  EVAL: scapular asymmetry in bilateral shoulder elevation .Pt has discoloration in left axilla from radiation and still has some bruising and fullness in left breast medical to nipple and scar.  She is wearing a light compression bra that she got from [employer].   UPPER EXTREMITY AROM/PROM: A/PROM RIGHT   eval   Shoulder extension 55  Shoulder flexion 165  Shoulder abduction 180  Shoulder internal rotation 40  Shoulder external rotation 90    (Blank rows = not tested)  A/PROM LEFT   eval  Shoulder extension 55  Shoulder flexion 140  Shoulder abduction 150  Shoulder internal rotation 20* unable to hold arm up   Shoulder external rotation 80    (Blank rows = not tested)  UPPER EXTREMITY STRENGTH: limited by pain on the left especailly in shoulder abduction   LLYMPHEDEMA ASSESSMENTS:  SURGERY TYPE/DATE: left lumpectomy 06/25/2022 0/5 nodes  NUMBER OF LYMPH NODES REMOVED: 5 CHEMOTHERAPY: no RADIATION:yes  HORMONE TREATMENT: yes INFECTIONS: no  LYMPHEDEMA ASSESSMENTS:   LANDMARK RIGHT  eval  10 cm proximal to olecranon process 28.5  Olecranon process 26  10 cm proximal to ulnar styloid process 19.5  Just proximal to ulnar styloid process 16  Across hand at thumb web space 19.5  At base of 2nd digit 6.2  (Blank rows = not tested)  LANDMARK LEFT  eval  10 cm proximal to olecranon process 28  Olecranon  process 25.5  10 cm proximal to ulnar styloid process 19.5  Just proximal to ulnar styloid process 15.5  Across hand at thumb web space `9.5  At base of 2nd digit 6  (Blank rows = not tested)   TODAY'S TREATMENT:           11/23/22: Redid SOZO which is WNL Manual Therapy:  STM with cocoa butter to Lt UT, latissimus, pectoralis, upper medial arm, axillary borders in supine with any Trigger point release as needed and then in sidelying to the latissimus, rhomboids PROM to tolerance all directions Joint mob: [place] AP and inferior glides grade IV- 4x30" each                                                                                                                                 DATE:11/19/2022 performed eval, instructed and performed Meeks decompression exercises and supine dowel shoulder flexion with knees bent to keep back against mat   PATIENT EDUCATION:  Education details: meeks decompression and supine dowel shoulder flexion  Person educated: Patient Education method: Consulting civil engineer, Media planner, and Handouts Education comprehension: verbalized understanding and returned demonstration  HOME EXERCISE PROGRAM: 1. Decompression Exercise     Cancer Rehab    Lie on back on firm surface, knees bent, feet flat, arms turned up, out to sides, backs of hands down. Time _5-15__ minutes. Surface: floor   2. Shoulder Press    Start in Decompression Exercise position. Press shoulders downward towards supporting surface. Hold __2-3__ seconds while counting out loud. Repeat _3-5___ times. Do _1-2___ times per day.   3. Head Press    Bring cervical spine (neck) into neutral position (by either tucking the chin towards the chest or tilting the chin upward). Feel weight on back of head. Press head downward into supporting surface.    Hold _2-3__ seconds. Repeat _3-5__ times. Do _1-2__ times per day.   4. Leg Lengthener    Straighten one leg. Pull toes AND forefoot toward knee, extend  heel. Lengthen leg by pulling pelvis away from ribs. Hold _2-3__ seconds. Relax. Repeat __4-6__ times. Do other leg.  Surface: floor   5. Leg Press    Straighten one leg down to floor keeping leg aligned with hip. Pull toes AND forefoot toward knee; extend heel.  Press entire leg downward (as if pressing leg into sandy beach). DO NOT BEND KNEE. Hold _2-3__ seconds. Do __4-6__ times. Repeat with other leg.   SHOULDER: External Rotation - Supine (Cane)   SHOULDER: Flexion - Supine (Cane)        Cancer Rehab    Hold cane in both hands. Raise arms up overhead. Do not allow back to arch. Hold _5__ seconds. Do __5-10__ times; __1-2__ times a day.   ASSESSMENT: CLINICAL IMPRESSION: Pt tolerated treatment well with difficulty relaxing and many vcs needed.  Guarded shoulder complex overall.  Education on reasons how radiation decreases mobility and ROM. SOZO is WNL.   OBJECTIVE IMPAIRMENTS: decreased activity tolerance, decreased coordination, decreased endurance, decreased knowledge of condition, decreased knowledge of use of DME, decreased ROM, decreased strength, increased fascial restrictions, impaired perceived functional ability, increased muscle spasms, impaired UE functional use, postural dysfunction, and pain.   ACTIVITY LIMITATIONS: reaching behind her and reaching overhead   PARTICIPATION LIMITATIONS: possibly family obligations   PERSONAL  FACTORS: recent surgery, radiation, on hormone blocker are also affecting patient's functional outcome.   REHAB POTENTIAL: Good  CLINICAL DECISION MAKING: Stable/uncomplicated  EVALUATION COMPLEXITY: Low  GOALS: Goals reviewed with patient? Yes  LONG  TERM GOALS: Target date: 12/20/2022  Pt will decrease QuickDASH to < 10 indicating improvement in left UE function  Baseline:36.35 Goal status: INITIAL  2.  Pt will reports she can reach to backseat of car without pain  Baseline: painful  Goal status: INITIAL  3.  Pt will reports she  is indepenent in home program for shoulder ROM and strengthening  Baseline: needs review  Goal status: INITIAL  4.  Pt will increrase left shoulder abduction to 160 degrees without pain  Baseline: 150 and pain  Goal status: INITIAL  5.  Pt will be able to hold left arm up at 90 degrees of shoulder abduction without pain  Baseline: unable to hold arm up at 90 to test internal rotation due to pain  Goal status: INITIAL   PLAN:  PT FREQUENCY: 2x/week  PT DURATION: 4 weeks  PLANNED INTERVENTIONS: Therapeutic exercises, Therapeutic activity, Neuromuscular re-education, Patient/Family education, Self Care, Joint mobilization, and Manual lymph drainage. Manual technques, iontophoresis, dry needling if pt does not improve with manual work   PLAN FOR NEXT SESSION: schedule out SOZO, do manual work to tight soft tissue at  pecs, upper traps.lats and scapular area.  PROM and stretching to left shoulder, progress home program.    Stark Bray, PT 11/23/2022, 10:58 AM

## 2022-11-24 ENCOUNTER — Encounter: Payer: Self-pay | Admitting: Rehabilitation

## 2022-11-24 ENCOUNTER — Ambulatory Visit: Payer: PPO | Admitting: Rehabilitation

## 2022-11-24 DIAGNOSIS — M6281 Muscle weakness (generalized): Secondary | ICD-10-CM

## 2022-11-24 DIAGNOSIS — Z170 Estrogen receptor positive status [ER+]: Secondary | ICD-10-CM

## 2022-11-24 DIAGNOSIS — M25612 Stiffness of left shoulder, not elsewhere classified: Secondary | ICD-10-CM

## 2022-11-24 DIAGNOSIS — C50512 Malignant neoplasm of lower-outer quadrant of left female breast: Secondary | ICD-10-CM | POA: Diagnosis not present

## 2022-11-24 DIAGNOSIS — M25512 Pain in left shoulder: Secondary | ICD-10-CM

## 2022-11-24 DIAGNOSIS — Z483 Aftercare following surgery for neoplasm: Secondary | ICD-10-CM

## 2022-11-24 DIAGNOSIS — R293 Abnormal posture: Secondary | ICD-10-CM

## 2022-11-24 DIAGNOSIS — Z9189 Other specified personal risk factors, not elsewhere classified: Secondary | ICD-10-CM

## 2022-11-24 NOTE — Therapy (Signed)
OUTPATIENT PHYSICAL THERAPY ONCOLOGY TREATMENT  Patient Name: Carol Harris MRN: [id number] DOB:1955/10/28, 67 y.o., female Today's Date: 11/24/2022  END OF SESSION:  PT End of Session - 11/24/22 0755     Visit Number 3    Number of Visits 9    Date for PT Re-Evaluation 12/20/22    PT Start Time 0800    PT Stop Time 0850    PT Time Calculation (min) 50 min    Activity Tolerance Patient tolerated treatment well              Past Medical History:  Diagnosis Date   Anxiety    Colon polyp    GERD (gastroesophageal reflux disease)    HTN (hypertension), benign 09/05/2011   Hyperlipidemia, mixed 09/05/2011   Leukopenia 09/05/2011   Sleep apnea    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[place]) 12/19/2013   no defecits   Past Surgical History:  Procedure Laterality Date   BREAST LUMPECTOMY WITH RADIOACTIVE SEED AND SENTINEL LYMPH NODE BIOPSY Left 06/25/2022   Procedure: LEFT BREAST LUMPECTOMY WITH RADIOACTIVE SEED AND SENTINEL LYMPH NODE BIOPSY;  Surgeon: Carol Kussmaul, MD;  Location: [place];  Service: General;  Laterality: Left;   KNEE ARTHROSCOPY Right 2005 approx   torn meniscus   TUBAL LIGATION     twice (first failed)   uterine ablation     Patient Active Problem List   Diagnosis Date Noted   Malignant neoplasm of lower-outer quadrant of left breast of female, estrogen receptor positive ([place]) 06/16/2022   Genetic testing 06/16/2022   Restless leg syndrome 11/08/2018   Left ventricular hypertrophy 11/08/2018   Depression 11/03/2018   Family history of colon cancer in mother 11/03/2018   History of vitamin D deficiency 11/03/2018   Night terrors, adult 11/03/2018   History of CVA (cerebrovascular accident) 07/12/2014   Obstructive sleep apnea 01/08/2014   GERD (gastroesophageal reflux disease) 12/19/2013   Leukopenia 09/05/2011   HTN (hypertension), benign 09/05/2011   Hyperlipidemia, mixed 09/05/2011    PCP: Carol Ohara, MD  REFERRING PROVIDER: Nicholas Lose, MD  REFERRING DIAG: C50.512,Z17.0 (ICD-10-CM) - Malignant neoplasm of lower-outer quadrant of left breast of female, estrogen receptor positive ([place])  THERAPY DIAG:  Stiffness of left shoulder, not elsewhere classified  Left shoulder pain, unspecified chronicity  Abnormal posture  Malignant neoplasm of lower-outer quadrant of left breast of female, estrogen receptor positive ([place])  Aftercare following surgery for neoplasm  Muscle weakness (generalized)  At risk for lymphedema  ONSET DATE: 06/25/2022  Rationale for Evaluation and Treatment: Rehabilitation  SUBJECTIVE:  SUBJECTIVE STATEMENT:  It felt wonderful last time.  I get stiff again over night   EVAL: pt states she can/t reach reach around to the back seat. And she can't do the exercises she has always done with her arm. She still has tingling and pain  in her upper arm.  She sometimes has to take a tylenol at night. She had been doing yoga   PERTINENT HISTORY: 06/25/2022: Left lumpectomy: Grade 2 IDC 1.7 cm, margins negative, 0/5 sentinel nodes negative ER 95%, PR 80%, HER2 negative, Ki-67 15%  Adjuvant radiation completed 09/14/2022 No chemo, but is on letrozole, pt did not tolerate anastrozole     PAIN:  Are you having pain? Yes NPRS scale: 3/10 at rest Pain location: left shoulder  Pain orientation: Left and Lateral  PAIN TYPE: sharp and sometimes a cramp  Pain description: intermittent  Aggravating factors: reaching back and sometimes rasing arm  Relieving factors: massage, takes medicine, tries to move it in another direction   PRECAUTIONS: at risk for lymphedemaa   WEIGHT BEARING RESTRICTIONS: No  FALLS:  Has patient fallen in last 6 months? No  LIVING ENVIRONMENT: Lives with: liives with spouse, no pets  Lives in:  House/apartment Stairs: Yes; up to husband's study  Has following equipment at home: None  OCCUPATION: retired   Psychologist, clinical: travel, exercise , spend time with family   PATIENT GOALS: to get rid of shoulder pain and get back to exercising and get movement and strength back    OBJECTIVE: PALPATION:  EVAL: Firmness and pain in pec major with pinpoint tenderness at corocoid process at pec minor insertions. She also has palpable tightness in left upper trap, lat and scapular muscles.   OBSERVATIONS / OTHER ASSESSMENTS:  EVAL: scapular asymmetry in bilateral shoulder elevation .Pt has discoloration in left axilla from radiation and still has some bruising and fullness in left breast medical to nipple and scar.  She is wearing a light compression bra that she got from [employer].   UPPER EXTREMITY AROM/PROM: A/PROM RIGHT   eval   Shoulder extension 55  Shoulder flexion 165  Shoulder abduction 180  Shoulder internal rotation 40  Shoulder external rotation 90    (Blank rows = not tested)  A/PROM LEFT   eval  Shoulder extension 55  Shoulder flexion 140  Shoulder abduction 150  Shoulder internal rotation 20* unable to hold arm up   Shoulder external rotation 80    (Blank rows = not tested)  UPPER EXTREMITY STRENGTH: limited by pain on the left especailly in shoulder abduction   LLYMPHEDEMA ASSESSMENTS:  SURGERY TYPE/DATE: left lumpectomy 06/25/2022 0/5 nodes  NUMBER OF LYMPH NODES REMOVED: 5 CHEMOTHERAPY: no RADIATION:yes  HORMONE TREATMENT: yes INFECTIONS: no  LYMPHEDEMA ASSESSMENTS:   LANDMARK RIGHT  eval  10 cm proximal to olecranon process 28.5  Olecranon process 26  10 cm proximal to ulnar styloid process 19.5  Just proximal to ulnar styloid process 16  Across hand at thumb web space 19.5  At base of 2nd digit 6.2  (Blank rows = not tested)  LANDMARK LEFT  eval  10 cm proximal to olecranon process 28  Olecranon process 25.5  10 cm proximal to ulnar styloid  process 19.5  Just proximal to ulnar styloid process 15.5  Across hand at thumb web space `9.5  At base of 2nd digit 6  (Blank rows = not tested)   TODAY'S TREATMENT:           11/24/22: Carol Harris  flexion x 27mn and scaption x 229m with initial cueing and cueing for no pinching Seated UT and LS stretch 2x15"  Reviewed HEP: Palm up extension press 5" x 5  Palm down shoulder press 5" x 5  Dowel flexion x 10 with cueing for no pinch Isometric straight arm 5" x 5 in/out/up/down Isometrics at 45 deg of abduction 5" x 5 x 2 IR/ER  Manual Therapy: PROM to tolerance all directions Joint mob: GHGorstP and inferior glides grade IV- 4x30" each  11/23/22: Redid SOZO which is WNL Manual Therapy: STM with cocoa butter to Lt UT, latissimus, pectoralis, upper medial arm, axillary borders in supine with any Trigger point release as needed and then in sidelying to the latissimus, rhomboids PROM to tolerance all directions Joint mob: [place]P and inferior glides grade IV- 4x30" each                                                                                                                                 DATE:11/19/2022 performed eval, instructed and performed Meeks decompression exercises and supine dowel shoulder flexion with knees bent to keep back against mat   PATIENT EDUCATION:  Education details: meeks decompression and supine dowel shoulder flexion  Person educated: Patient Education method: ExConsulting civil engineerDeMedia plannerand Handouts Education comprehension: verbalized understanding and returned demonstration  HOME EXERCISE PROGRAM: 1. Decompression Exercise     Cancer Rehab    Lie on back on firm surface, knees bent, feet flat, arms turned up, out to sides, backs of hands down. Time _5-15__ minutes. Surface: floor   2. Shoulder Press    Start in Decompression Exercise position. Press shoulders downward towards supporting surface. Hold __2-3__ seconds while counting out loud. Repeat  _3-5___ times. Do _1-2___ times per day.   3. Head Press    Bring cervical spine (neck) into neutral position (by either tucking the chin towards the chest or tilting the chin upward). Feel weight on back of head. Press head downward into supporting surface.    Hold _2-3__ seconds. Repeat _3-5__ times. Do _1-2__ times per day.   4. Leg Lengthener    Straighten one leg. Pull toes AND forefoot toward knee, extend heel. Lengthen leg by pulling pelvis away from ribs. Hold _2-3__ seconds. Relax. Repeat __4-6__ times. Do other leg.  Surface: floor   5. Leg Press    Straighten one leg down to floor keeping leg aligned with hip. Pull toes AND forefoot toward knee; extend heel.  Press entire leg downward (as if pressing leg into sandy beach). DO NOT BEND KNEE. Hold _2-3__ seconds. Do __4-6__ times. Repeat with other leg.   SHOULDER: External Rotation - Supine (Cane)   SHOULDER: Flexion - Supine (Cane)        Cancer Rehab    Hold cane in both hands. Raise arms up overhead. Do not allow back to arch. Hold _5__ seconds. Do __5-10__ times; __1-2__ times a day.  ASSESSMENT: CLINICAL IMPRESSION: Focused more on TE due to massage just yesterday.  Pt is feeling better already.  Seems to be limited mainly by guarding.   OBJECTIVE IMPAIRMENTS: decreased activity tolerance, decreased coordination, decreased endurance, decreased knowledge of condition, decreased knowledge of use of DME, decreased ROM, decreased strength, increased fascial restrictions, impaired perceived functional ability, increased muscle spasms, impaired UE functional use, postural dysfunction, and pain.   ACTIVITY LIMITATIONS: reaching behind her and reaching overhead   PARTICIPATION LIMITATIONS: possibly family obligations   PERSONAL FACTORS: recent surgery, radiation, on hormone blocker are also affecting patient's functional outcome.   REHAB POTENTIAL: Good  CLINICAL DECISION MAKING:  Stable/uncomplicated  EVALUATION COMPLEXITY: Low  GOALS: Goals reviewed with patient? Yes  LONG  TERM GOALS: Target date: 12/20/2022  Pt will decrease QuickDASH to < 10 indicating improvement in left UE function  Baseline:36.35 Goal status: INITIAL  2.  Pt will reports she can reach to backseat of car without pain  Baseline: painful  Goal status: INITIAL  3.  Pt will reports she is indepenent in home program for shoulder ROM and strengthening  Baseline: needs review  Goal status: INITIAL  4.  Pt will increrase left shoulder abduction to 160 degrees without pain  Baseline: 150 and pain  Goal status: INITIAL  5.  Pt will be able to hold left arm up at 90 degrees of shoulder abduction without pain  Baseline: unable to hold arm up at 90 to test internal rotation due to pain  Goal status: INITIAL   PLAN:  PT FREQUENCY: 2x/week  PT DURATION: 4 weeks  PLANNED INTERVENTIONS: Therapeutic exercises, Therapeutic activity, Neuromuscular re-education, Patient/Family education, Self Care, Joint mobilization, and Manual lymph drainage. Manual technques, iontophoresis, dry needling if pt does not improve with manual work   PLAN FOR NEXT SESSION: schedule out SOZO, do manual work to tight soft tissue at  pecs, upper traps.lats and scapular area.  PROM and stretching to left shoulder, progress home program.    Stark Bray, PT 11/24/2022, 8:55 AM

## 2022-11-26 ENCOUNTER — Encounter: Payer: PPO | Admitting: Rehabilitation

## 2022-11-29 ENCOUNTER — Ambulatory Visit: Payer: PPO | Admitting: Rehabilitation

## 2022-11-30 ENCOUNTER — Encounter: Payer: PPO | Admitting: Rehabilitation

## 2022-11-30 ENCOUNTER — Ambulatory Visit: Payer: PPO | Admitting: Rehabilitation

## 2022-11-30 ENCOUNTER — Encounter: Payer: Self-pay | Admitting: Rehabilitation

## 2022-11-30 DIAGNOSIS — Z9189 Other specified personal risk factors, not elsewhere classified: Secondary | ICD-10-CM

## 2022-11-30 DIAGNOSIS — R293 Abnormal posture: Secondary | ICD-10-CM

## 2022-11-30 DIAGNOSIS — C50512 Malignant neoplasm of lower-outer quadrant of left female breast: Secondary | ICD-10-CM | POA: Diagnosis not present

## 2022-11-30 DIAGNOSIS — Z170 Estrogen receptor positive status [ER+]: Secondary | ICD-10-CM

## 2022-11-30 DIAGNOSIS — Z483 Aftercare following surgery for neoplasm: Secondary | ICD-10-CM

## 2022-11-30 DIAGNOSIS — M25612 Stiffness of left shoulder, not elsewhere classified: Secondary | ICD-10-CM

## 2022-11-30 DIAGNOSIS — M6281 Muscle weakness (generalized): Secondary | ICD-10-CM

## 2022-11-30 DIAGNOSIS — M25512 Pain in left shoulder: Secondary | ICD-10-CM

## 2022-11-30 NOTE — Therapy (Signed)
OUTPATIENT PHYSICAL THERAPY ONCOLOGY TREATMENT  Patient Name: Carol Harris MRN: [id number] DOB:[birth year], 67 y.o., female Today's Date: 11/30/2022  END OF SESSION:  PT End of Session - 11/30/22 0756     Visit Number 4    Number of Visits 9    Date for PT Re-Evaluation 12/20/22    PT Start Time 0800    PT Stop Time 0850    PT Time Calculation (min) 50 min    Activity Tolerance Patient tolerated treatment well    Behavior During Therapy [facility] for tasks assessed/performed              Past Medical History:  Diagnosis Date   Anxiety    Colon polyp    GERD (gastroesophageal reflux disease)    HTN (hypertension), benign 09/05/2011   Hyperlipidemia, mixed 09/05/2011   Leukopenia 09/05/2011   Sleep apnea    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[place]) 12/19/2013   no defecits   Past Surgical History:  Procedure Laterality Date   BREAST LUMPECTOMY WITH RADIOACTIVE SEED AND SENTINEL LYMPH NODE BIOPSY Left 06/25/2022   Procedure: LEFT BREAST LUMPECTOMY WITH RADIOACTIVE SEED AND SENTINEL LYMPH NODE BIOPSY;  Surgeon: Jovita Kussmaul, MD;  Location: [place];  Service: General;  Laterality: Left;   KNEE ARTHROSCOPY Right 2005 approx   torn meniscus   TUBAL LIGATION     twice (first failed)   uterine ablation     Patient Active Problem List   Diagnosis Date Noted   Malignant neoplasm of lower-outer quadrant of left breast of female, estrogen receptor positive ([place]) 06/16/2022   Genetic testing 06/16/2022   Restless leg syndrome 11/08/2018   Left ventricular hypertrophy 11/08/2018   Depression 11/03/2018   Family history of colon cancer in mother 11/03/2018   History of vitamin D deficiency 11/03/2018   Night terrors, adult 11/03/2018   History of CVA (cerebrovascular accident) 07/12/2014   Obstructive sleep apnea 01/08/2014   GERD (gastroesophageal reflux disease) 12/19/2013   Leukopenia 09/05/2011   HTN (hypertension), benign 09/05/2011   Hyperlipidemia, mixed  09/05/2011    PCP: Rita Ohara, MD  REFERRING PROVIDER: Nicholas Lose, MD  REFERRING DIAG: C50.512,Z17.0 (ICD-10-CM) - Malignant neoplasm of lower-outer quadrant of left breast of female, estrogen receptor positive ([place])  THERAPY DIAG:  Stiffness of left shoulder, not elsewhere classified  Left shoulder pain, unspecified chronicity  Abnormal posture  Malignant neoplasm of lower-outer quadrant of left breast of female, estrogen receptor positive ([place])  Aftercare following surgery for neoplasm  Muscle weakness (generalized)  At risk for lymphedema  ONSET DATE: 06/25/2022  Rationale for Evaluation and Treatment: Rehabilitation  SUBJECTIVE:  SUBJECTIVE STATEMENT:  My arm has been doing well!  I have learned how relaxing really helps   EVAL: pt states she can/t reach reach around to the back seat. And she can't do the exercises she has always done with her arm. She still has tingling and pain  in her upper arm.  She sometimes has to take a tylenol at night. She had been doing yoga   PERTINENT HISTORY: 06/25/2022: Left lumpectomy: Grade 2 IDC 1.7 cm, margins negative, 0/5 sentinel nodes negative ER 95%, PR 80%, HER2 negative, Ki-67 15%  Adjuvant radiation completed 09/14/2022 No chemo, but is on letrozole, pt did not tolerate anastrozole    PAIN:  Are you having pain? No NPRS scale: 0/10 at rest  PRECAUTIONS: at risk for lymphedemaa   WEIGHT BEARING RESTRICTIONS: No  FALLS:  Has patient fallen in last 6 months? No  LIVING ENVIRONMENT: Lives with: liives with spouse, no pets  Lives in: House/apartment Stairs: Yes; up to husband's study  Has following equipment at home: None  OCCUPATION: retired   Psychologist, clinical: travel, exercise , spend time with family   PATIENT GOALS: to get rid of shoulder  pain and get back to exercising and get movement and strength back    OBJECTIVE: PALPATION:  EVAL: Firmness and pain in pec major with pinpoint tenderness at corocoid process at pec minor insertions. She also has palpable tightness in left upper trap, lat and scapular muscles.   OBSERVATIONS / OTHER ASSESSMENTS:  EVAL: scapular asymmetry in bilateral shoulder elevation .Pt has discoloration in left axilla from radiation and still has some bruising and fullness in left breast medical to nipple and scar.  She is wearing a light compression bra that she got from [employer].   UPPER EXTREMITY AROM/PROM: A/PROM RIGHT   eval   Shoulder extension 55  Shoulder flexion 165  Shoulder abduction 180  Shoulder internal rotation 40  Shoulder external rotation 90    (Blank rows = not tested)  A/PROM LEFT   eval  Shoulder extension 55  Shoulder flexion 140  Shoulder abduction 150  Shoulder internal rotation 20* unable to hold arm up   Shoulder external rotation 80    (Blank rows = not tested)  UPPER EXTREMITY STRENGTH: limited by pain on the left especailly in shoulder abduction   LLYMPHEDEMA ASSESSMENTS:  SURGERY TYPE/DATE: left lumpectomy 06/25/2022 0/5 nodes  NUMBER OF LYMPH NODES REMOVED: 5 CHEMOTHERAPY: no RADIATION:yes  HORMONE TREATMENT: yes INFECTIONS: no  LYMPHEDEMA ASSESSMENTS:   LANDMARK RIGHT  eval  10 cm proximal to olecranon process 28.5  Olecranon process 26  10 cm proximal to ulnar styloid process 19.5  Just proximal to ulnar styloid process 16  Across hand at thumb web space 19.5  At base of 2nd digit 6.2  (Blank rows = not tested)  LANDMARK LEFT  eval  10 cm proximal to olecranon process 28  Olecranon process 25.5  10 cm proximal to ulnar styloid process 19.5  Just proximal to ulnar styloid process 15.5  Across hand at thumb web space `9.5  At base of 2nd digit 6  (Blank rows = not tested)   TODAY'S TREATMENT:       11/30/22: Pulleys flexion x  76mn and scaption x 286m with initial cueing and cueing for no pinching Seated UT and LS stretch 2x15"  Isometric straight arm 5" x 5 in/out/up/down Isometrics at 45 deg of abduction 5" x 5 x 2 IR/ER Added isometrics to HEP with performance of  each 5" x 5 at the wall with a towel roll STM with cocoa butter to Lt UT, latissimus, pectoralis, upper medial arm, axillary borders in supine with any Trigger point release as needed and then in sidelying to the latissimus, rhomboids PROM to tolerance all directions Joint mob: [place] AP and inferior glides grade IV- 4x30" each      11/24/22: Pulleys flexion x 78mn and scaption x 28m with initial cueing and cueing for no pinching Seated UT and LS stretch 2x15"  Reviewed HEP: Palm up extension press 5" x 5  Palm down shoulder press 5" x 5  Dowel flexion x 10 with cueing for no pinch Isometric straight arm 5" x 5 in/out/up/down Isometrics at 45 deg of abduction 5" x 5 x 2 IR/ER  Manual Therapy: PROM to tolerance all directions Joint mob: G[place]P and inferior glides grade IV- 4x30" each  11/23/22: Redid SOZO which is WNL Manual Therapy: STM with cocoa butter to Lt UT, latissimus, pectoralis, upper medial arm, axillary borders in supine with any Trigger point release as needed and then in sidelying to the latissimus, rhomboids PROM to tolerance all directions Joint mob: GHCalifornia JunctionP and inferior glides grade IV- 4x30" each                                                                                                                                 DATE:11/19/2022 performed eval, instructed and performed Meeks decompression exercises and supine dowel shoulder flexion with knees bent to keep back against mat   PATIENT EDUCATION:  Education details: meeks decompression and supine dowel shoulder flexion  Person educated: Patient Education method: ExConsulting civil engineerDeMedia plannerand Handouts Education comprehension: verbalized understanding and returned  demonstration  HOME EXERCISE PROGRAM: Access Code: [email or web address] URL: [email or web address] Date: 11/30/2022 Prepared by: KaShan LevansExercises - Isometric Shoulder Flexion at Wall  - 1 x daily - 7 x weekly - 1 sets - 5-10 reps - 6-10sec hold - Isometric Shoulder Extension at Wall  - 1 x daily - 7 x weekly - 1 sets - 5-10 reps - 6-10 sec hold - Isometric Shoulder External Rotation at Wall  - 1 x daily - 7 x weekly - 1 sets - 5-10 reps - 6-10 sec hold - Standing Isometric Shoulder Internal Rotation at Doorway  - 1 x daily - 7 x weekly - 1 sets - 5-10 reps - 6-10 sec hold - Wall Push Up with Plus  - 1 x daily - 7 x weekly - 1 sets - 10 reps - no hold  ASSESSMENT: CLINICAL IMPRESSION:  Updated HEP per above.  Pt is showing progress already.  Noted improvements in putting on her coat and resting pain of 0/10.   OBJECTIVE IMPAIRMENTS: decreased activity tolerance, decreased coordination, decreased endurance, decreased knowledge of condition, decreased knowledge of use of DME, decreased ROM, decreased strength, increased fascial restrictions, impaired perceived functional ability, increased muscle spasms, impaired UE functional use,  postural dysfunction, and pain.   ACTIVITY LIMITATIONS: reaching behind her and reaching overhead   PARTICIPATION LIMITATIONS: possibly family obligations   PERSONAL FACTORS: recent surgery, radiation, on hormone blocker are also affecting patient's functional outcome.   REHAB POTENTIAL: Good  CLINICAL DECISION MAKING: Stable/uncomplicated  EVALUATION COMPLEXITY: Low  GOALS: Goals reviewed with patient? Yes  LONG  TERM GOALS: Target date: 12/20/2022  Pt will decrease QuickDASH to < 10 indicating improvement in left UE function  Baseline:36.35 Goal status: INITIAL  2.  Pt will reports she can reach to backseat of car without pain  Baseline: painful  Goal status: INITIAL  3.  Pt will reports she is indepenent in home program for shoulder ROM  and strengthening  Baseline: needs review  Goal status: INITIAL  4.  Pt will increrase left shoulder abduction to 160 degrees without pain  Baseline: 150 and pain  Goal status: INITIAL  5.  Pt will be able to hold left arm up at 90 degrees of shoulder abduction without pain  Baseline: unable to hold arm up at 90 to test internal rotation due to pain  Goal status: INITIAL   PLAN:  PT FREQUENCY: 2x/week  PT DURATION: 4 weeks  PLANNED INTERVENTIONS: Therapeutic exercises, Therapeutic activity, Neuromuscular re-education, Patient/Family education, Self Care, Joint mobilization, and Manual lymph drainage. Manual technques, iontophoresis, dry needling if pt does not improve with manual work   PLAN FOR NEXT SESSION: schedule out SOZO, do manual work to tight soft tissue at  pecs, upper traps.lats and scapular area.  PROM and stretching to left shoulder, progress home program.    Stark Bray, PT 11/30/2022, 8:50 AM

## 2022-12-02 ENCOUNTER — Ambulatory Visit: Payer: PPO | Admitting: Rehabilitation

## 2022-12-02 ENCOUNTER — Encounter: Payer: Self-pay | Admitting: Rehabilitation

## 2022-12-02 DIAGNOSIS — Z9189 Other specified personal risk factors, not elsewhere classified: Secondary | ICD-10-CM

## 2022-12-02 DIAGNOSIS — M6281 Muscle weakness (generalized): Secondary | ICD-10-CM

## 2022-12-02 DIAGNOSIS — M25512 Pain in left shoulder: Secondary | ICD-10-CM

## 2022-12-02 DIAGNOSIS — M25612 Stiffness of left shoulder, not elsewhere classified: Secondary | ICD-10-CM

## 2022-12-02 DIAGNOSIS — R293 Abnormal posture: Secondary | ICD-10-CM

## 2022-12-02 DIAGNOSIS — C50512 Malignant neoplasm of lower-outer quadrant of left female breast: Secondary | ICD-10-CM | POA: Diagnosis not present

## 2022-12-02 DIAGNOSIS — Z483 Aftercare following surgery for neoplasm: Secondary | ICD-10-CM

## 2022-12-02 DIAGNOSIS — Z170 Estrogen receptor positive status [ER+]: Secondary | ICD-10-CM

## 2022-12-02 NOTE — Therapy (Signed)
OUTPATIENT PHYSICAL THERAPY ONCOLOGY TREATMENT  Patient Name: Carol Harris MRN: [id number] DOB:[birth year], 67 y.o., female Today's Date: 12/02/2022  END OF SESSION:  PT End of Session - 12/02/22 1005     Visit Number 5    Number of Visits 9    Date for PT Re-Evaluation 12/20/22    PT Start Time 1006    PT Stop Time 1054    PT Time Calculation (min) 48 min    Activity Tolerance Patient tolerated treatment well    Behavior During Therapy [facility] for tasks assessed/performed              Past Medical History:  Diagnosis Date   Anxiety    Colon polyp    GERD (gastroesophageal reflux disease)    HTN (hypertension), benign 09/05/2011   Hyperlipidemia, mixed 09/05/2011   Leukopenia 09/05/2011   Sleep apnea    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[place]) 12/19/2013   no defecits   Past Surgical History:  Procedure Laterality Date   BREAST LUMPECTOMY WITH RADIOACTIVE SEED AND SENTINEL LYMPH NODE BIOPSY Left 06/25/2022   Procedure: LEFT BREAST LUMPECTOMY WITH RADIOACTIVE SEED AND SENTINEL LYMPH NODE BIOPSY;  Surgeon: Jovita Kussmaul, MD;  Location: [place];  Service: General;  Laterality: Left;   KNEE ARTHROSCOPY Right 2005 approx   torn meniscus   TUBAL LIGATION     twice (first failed)   uterine ablation     Patient Active Problem List   Diagnosis Date Noted   Malignant neoplasm of lower-outer quadrant of left breast of female, estrogen receptor positive ([place]) 06/16/2022   Genetic testing 06/16/2022   Restless leg syndrome 11/08/2018   Left ventricular hypertrophy 11/08/2018   Depression 11/03/2018   Family history of colon cancer in mother 11/03/2018   History of vitamin D deficiency 11/03/2018   Night terrors, adult 11/03/2018   History of CVA (cerebrovascular accident) 07/12/2014   Obstructive sleep apnea 01/08/2014   GERD (gastroesophageal reflux disease) 12/19/2013   Leukopenia 09/05/2011   HTN (hypertension), benign 09/05/2011   Hyperlipidemia, mixed  09/05/2011    PCP: Rita Ohara, MD  REFERRING PROVIDER: Nicholas Lose, MD  REFERRING DIAG: C50.512,Z17.0 (ICD-10-CM) - Malignant neoplasm of lower-outer quadrant of left breast of female, estrogen receptor positive ([place])  THERAPY DIAG:  Stiffness of left shoulder, not elsewhere classified  Left shoulder pain, unspecified chronicity  Abnormal posture  Malignant neoplasm of lower-outer quadrant of left breast of female, estrogen receptor positive ([place])  Aftercare following surgery for neoplasm  Muscle weakness (generalized)  At risk for lymphedema  ONSET DATE: 06/25/2022  Rationale for Evaluation and Treatment: Rehabilitation  SUBJECTIVE:  SUBJECTIVE STATEMENT:  I woke up a bit stiff today.   EVAL: pt states she can/t reach reach around to the back seat. And she can't do the exercises she has always done with her arm. She still has tingling and pain  in her upper arm.  She sometimes has to take a tylenol at night. She had been doing yoga   PERTINENT HISTORY: 06/25/2022: Left lumpectomy: Grade 2 IDC 1.7 cm, margins negative, 0/5 sentinel nodes negative ER 95%, PR 80%, HER2 negative, Ki-67 15%  Adjuvant radiation completed 09/14/2022 No chemo, but is on letrozole, pt did not tolerate anastrozole    PAIN:  Are you having pain? No NPRS scale: 0/10 at rest  PRECAUTIONS: at risk for lymphedemaa   WEIGHT BEARING RESTRICTIONS: No  FALLS:  Has patient fallen in last 6 months? No  LIVING ENVIRONMENT: Lives with: liives with spouse, no pets  Lives in: House/apartment Stairs: Yes; up to husband's study  Has following equipment at home: None  OCCUPATION: retired   Psychologist, clinical: travel, exercise , spend time with family   PATIENT GOALS: to get rid of shoulder pain and get back to exercising and get  movement and strength back    OBJECTIVE: PALPATION:  EVAL: Firmness and pain in pec major with pinpoint tenderness at corocoid process at pec minor insertions. She also has palpable tightness in left upper trap, lat and scapular muscles.   OBSERVATIONS / OTHER ASSESSMENTS:  EVAL: scapular asymmetry in bilateral shoulder elevation .Pt has discoloration in left axilla from radiation and still has some bruising and fullness in left breast medical to nipple and scar.  She is wearing a light compression bra that she got from [employer].   UPPER EXTREMITY AROM/PROM: A/PROM RIGHT   eval   Shoulder extension 55  Shoulder flexion 165  Shoulder abduction 180  Shoulder internal rotation 40  Shoulder external rotation 90    (Blank rows = not tested)  A/PROM LEFT   eval  Shoulder extension 55  Shoulder flexion 140  Shoulder abduction 150  Shoulder internal rotation 20* unable to hold arm up   Shoulder external rotation 80    (Blank rows = not tested)  UPPER EXTREMITY STRENGTH: limited by pain on the left especailly in shoulder abduction   LLYMPHEDEMA ASSESSMENTS:  SURGERY TYPE/DATE: left lumpectomy 06/25/2022 0/5 nodes  NUMBER OF LYMPH NODES REMOVED: 5 CHEMOTHERAPY: no RADIATION:yes  HORMONE TREATMENT: yes INFECTIONS: no  LYMPHEDEMA ASSESSMENTS:   LANDMARK RIGHT  eval  10 cm proximal to olecranon process 28.5  Olecranon process 26  10 cm proximal to ulnar styloid process 19.5  Just proximal to ulnar styloid process 16  Across hand at thumb web space 19.5  At base of 2nd digit 6.2  (Blank rows = not tested)  LANDMARK LEFT  eval  10 cm proximal to olecranon process 28  Olecranon process 25.5  10 cm proximal to ulnar styloid process 19.5  Just proximal to ulnar styloid process 15.5  Across hand at thumb web space `9.5  At base of 2nd digit 6  (Blank rows = not tested)   TODAY'S TREATMENT:       11/30/22: Pulleys flexion x 50mn and scaption x 281m with initial  cueing and cueing for no pinching Seated UT and LS stretch 2x15"  Yellow band row x 10 Bil extension yellow x 10 Bil ER yellow x 10 STM with cocoa butter to Lt UT, latissimus, pectoralis, upper medial arm, axillary borders in supine with any  Trigger point release as needed and then in sidelying to the latissimus, rhomboids PROM to tolerance all directions Joint mob: [place] AP and inferior glides grade IV- 4x30" each  11/30/22: Pulleys flexion x 60mn and scaption x 273m with initial cueing and cueing for no pinching Seated UT and LS stretch 2x15"  Isometric straight arm 5" x 5 in/out/up/down Isometrics at 45 deg of abduction 5" x 5 x 2 IR/ER Added isometrics to HEP with performance of each 5" x 5 at the wall with a towel roll STM with cocoa butter to Lt UT, latissimus, pectoralis, upper medial arm, axillary borders in supine with any Trigger point release as needed and then in sidelying to the latissimus, rhomboids PROM to tolerance all directions Joint mob: GHNorth CatasauquaP and inferior glides grade IV- 4x30" each      11/24/22: Pulleys flexion x [id number] scaption x [id number] initial cueing and cueing for no pinching Seated UT and LS stretch 2x15"  Reviewed HEP: Palm up extension press 5" x 5  Palm down shoulder press 5" x 5  Dowel flexion x 10 with cueing for no pinch Isometric straight arm 5" x 5 in/out/up/down Isometrics at 45 deg of abduction 5" x 5 x 2 IR/ER  Manual Therapy: PROM to tolerance all directions Joint mob: GH ADodge [place]and inferior glides grade IV- 4x30" each  11/23/22: Redid SOZO which is WNL Manual Therapy: STM with cocoa butter to Lt UT, latissimus, pectoralis, upper medial arm, axillary borders in supine with any Trigger point release as needed and then in sidelying to the latissimus, rhomboids PROM to tolerance all directions Joint mob: GH AAccovilleand inferior glides grade IV- 4x30" each                                                                                                                                  DATE:11/19/2022 performed eval, instructed and performed Meeks decompression exercises and supine dowel shoulder flexion with knees bent to keep back against mat   PATIENT EDUCATION:  Education details: meeks decompression and supine dowel shoulder flexion  Person educated: Patient Education method: ExplConsulting civil engineermoMedia plannerd Handouts Education comprehension: verbalized understanding and returned demonstration  HOME EXERCISE PROGRAM: Access Code: [email or web address] URL: [email or web address] Date: 11/30/2022 Prepared by: KaraShan Levansercises - Isometric Shoulder Flexion at Wall  - 1 x daily - 7 x weekly - 1 sets - 5-10 reps - 6-10sec hold - Isometric Shoulder Extension at Wall  - 1 x daily - 7 x weekly - 1 sets - 5-10 reps - 6-10 sec hold - Isometric Shoulder External Rotation at Wall  - 1 x daily - 7 x weekly - 1 sets - 5-10 reps - 6-10 sec hold - Standing Isometric Shoulder Internal Rotation at Doorway  - 1 x daily - 7 x weekly - 1 sets - 5-10 reps - 6-10 sec hold - Wall Push Up with Plus  - 1 x daily -  7 x weekly - 1 sets - 10 reps - no hold  ASSESSMENT: CLINICAL IMPRESSION:  Continued with POC and added band TE without increased pain.  Pt is happy with progress.   OBJECTIVE IMPAIRMENTS: decreased activity tolerance, decreased coordination, decreased endurance, decreased knowledge of condition, decreased knowledge of use of DME, decreased ROM, decreased strength, increased fascial restrictions, impaired perceived functional ability, increased muscle spasms, impaired UE functional use, postural dysfunction, and pain.   ACTIVITY LIMITATIONS: reaching behind her and reaching overhead   PARTICIPATION LIMITATIONS: possibly family obligations   PERSONAL FACTORS: recent surgery, radiation, on hormone blocker are also affecting patient's functional outcome.   REHAB POTENTIAL: Good  CLINICAL DECISION MAKING: Stable/uncomplicated  EVALUATION  COMPLEXITY: Low  GOALS: Goals reviewed with patient? Yes  LONG  TERM GOALS: Target date: 12/20/2022  Pt will decrease QuickDASH to < 10 indicating improvement in left UE function  Baseline:36.35 Goal status: INITIAL  2.  Pt will reports she can reach to backseat of car without pain  Baseline: painful  Goal status: INITIAL  3.  Pt will reports she is indepenent in home program for shoulder ROM and strengthening  Baseline: needs review  Goal status: INITIAL  4.  Pt will increrase left shoulder abduction to 160 degrees without pain  Baseline: 150 and pain  Goal status: INITIAL  5.  Pt will be able to hold left arm up at 90 degrees of shoulder abduction without pain  Baseline: unable to hold arm up at 90 to test internal rotation due to pain  Goal status: INITIAL   PLAN:  PT FREQUENCY: 2x/week  PT DURATION: 4 weeks  PLANNED INTERVENTIONS: Therapeutic exercises, Therapeutic activity, Neuromuscular re-education, Patient/Family education, Self Care, Joint mobilization, and Manual lymph drainage. Manual technques, iontophoresis, dry needling if pt does not improve with manual work   PLAN FOR NEXT SESSION: schedule out SOZO, do manual work to tight soft tissue at  pecs, upper traps.lats and scapular area.  PROM and stretching to left shoulder, progress home program.    Stark Bray, PT 12/02/2022, 10:57 AM

## 2022-12-03 ENCOUNTER — Telehealth: Payer: Self-pay | Admitting: Adult Health

## 2022-12-03 NOTE — Telephone Encounter (Signed)
Rescheduled appointment per provider culture meeting. Patient is aware of the changes made to her upcoming appointment.

## 2022-12-07 ENCOUNTER — Ambulatory Visit: Payer: PPO | Admitting: Rehabilitation

## 2022-12-07 ENCOUNTER — Encounter: Payer: Self-pay | Admitting: Rehabilitation

## 2022-12-07 DIAGNOSIS — Z483 Aftercare following surgery for neoplasm: Secondary | ICD-10-CM

## 2022-12-07 DIAGNOSIS — Z170 Estrogen receptor positive status [ER+]: Secondary | ICD-10-CM

## 2022-12-07 DIAGNOSIS — R293 Abnormal posture: Secondary | ICD-10-CM

## 2022-12-07 DIAGNOSIS — M6281 Muscle weakness (generalized): Secondary | ICD-10-CM

## 2022-12-07 DIAGNOSIS — G4733 Obstructive sleep apnea (adult) (pediatric): Secondary | ICD-10-CM | POA: Diagnosis not present

## 2022-12-07 DIAGNOSIS — C50512 Malignant neoplasm of lower-outer quadrant of left female breast: Secondary | ICD-10-CM | POA: Diagnosis not present

## 2022-12-07 DIAGNOSIS — M25512 Pain in left shoulder: Secondary | ICD-10-CM

## 2022-12-07 DIAGNOSIS — Z9189 Other specified personal risk factors, not elsewhere classified: Secondary | ICD-10-CM

## 2022-12-07 DIAGNOSIS — M25612 Stiffness of left shoulder, not elsewhere classified: Secondary | ICD-10-CM

## 2022-12-07 NOTE — Therapy (Signed)
OUTPATIENT PHYSICAL THERAPY ONCOLOGY TREATMENT  Patient Name: Carol Harris MRN: [id number] DOB:12-13-1955, 67 y.o., female Today's Date: 12/07/2022  END OF SESSION:  PT End of Session - 12/07/22 0954     Visit Number 6    Number of Visits 9    Date for PT Re-Evaluation 12/20/22    PT Start Time 1000    PT Stop Time 1048    PT Time Calculation (min) 48 min    Activity Tolerance Patient tolerated treatment well    Behavior During Therapy [facility] for tasks assessed/performed              Past Medical History:  Diagnosis Date   Anxiety    Colon polyp    GERD (gastroesophageal reflux disease)    HTN (hypertension), benign 09/05/2011   Hyperlipidemia, mixed 09/05/2011   Leukopenia 09/05/2011   Sleep apnea    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[place]) 12/19/2013   no defecits   Past Surgical History:  Procedure Laterality Date   BREAST LUMPECTOMY WITH RADIOACTIVE SEED AND SENTINEL LYMPH NODE BIOPSY Left 06/25/2022   Procedure: LEFT BREAST LUMPECTOMY WITH RADIOACTIVE SEED AND SENTINEL LYMPH NODE BIOPSY;  Surgeon: Jovita Kussmaul, MD;  Location: [place];  Service: General;  Laterality: Left;   KNEE ARTHROSCOPY Right 2005 approx   torn meniscus   TUBAL LIGATION     twice (first failed)   uterine ablation     Patient Active Problem List   Diagnosis Date Noted   Malignant neoplasm of lower-outer quadrant of left breast of female, estrogen receptor positive ([place]) 06/16/2022   Genetic testing 06/16/2022   Restless leg syndrome 11/08/2018   Left ventricular hypertrophy 11/08/2018   Depression 11/03/2018   Family history of colon cancer in mother 11/03/2018   History of vitamin D deficiency 11/03/2018   Night terrors, adult 11/03/2018   History of CVA (cerebrovascular accident) 07/12/2014   Obstructive sleep apnea 01/08/2014   GERD (gastroesophageal reflux disease) 12/19/2013   Leukopenia 09/05/2011   HTN (hypertension), benign 09/05/2011   Hyperlipidemia, mixed  09/05/2011    PCP: Rita Ohara, MD  REFERRING PROVIDER: Nicholas Lose, MD  REFERRING DIAG: C50.512,Z17.0 (ICD-10-CM) - Malignant neoplasm of lower-outer quadrant of left breast of female, estrogen receptor positive ([place])  THERAPY DIAG:  Stiffness of left shoulder, not elsewhere classified  Left shoulder pain, unspecified chronicity  Abnormal posture  Malignant neoplasm of lower-outer quadrant of left breast of female, estrogen receptor positive ([place])  Aftercare following surgery for neoplasm  Muscle weakness (generalized)  At risk for lymphedema  ONSET DATE: 06/25/2022  Rationale for Evaluation and Treatment: Rehabilitation  SUBJECTIVE:  SUBJECTIVE STATEMENT:  It is good and bad when I reach around the wrong way  EVAL: pt states she can/t reach reach around to the back seat. And she can't do the exercises she has always done with her arm. She still has tingling and pain  in her upper arm.  She sometimes has to take a tylenol at night. She had been doing yoga   PERTINENT HISTORY: 06/25/2022: Left lumpectomy: Grade 2 IDC 1.7 cm, margins negative, 0/5 sentinel nodes negative ER 95%, PR 80%, HER2 negative, Ki-67 15%  Adjuvant radiation completed 09/14/2022 No chemo, but is on letrozole, pt did not tolerate anastrozole    PAIN:  Are you having pain? No NPRS scale: 0/10 at rest  PRECAUTIONS: at risk for lymphedemaa   WEIGHT BEARING RESTRICTIONS: No  FALLS:  Has patient fallen in last 6 months? No  LIVING ENVIRONMENT: Lives with: liives with spouse, no pets  Lives in: House/apartment Stairs: Yes; up to husband's study  Has following equipment at home: None  OCCUPATION: retired   Psychologist, clinical: travel, exercise , spend time with family   PATIENT GOALS: to get rid of shoulder pain and get back to  exercising and get movement and strength back    OBJECTIVE: PALPATION:  EVAL: Firmness and pain in pec major with pinpoint tenderness at corocoid process at pec minor insertions. She also has palpable tightness in left upper trap, lat and scapular muscles.   OBSERVATIONS / OTHER ASSESSMENTS:  EVAL: scapular asymmetry in bilateral shoulder elevation .Pt has discoloration in left axilla from radiation and still has some bruising and fullness in left breast medical to nipple and scar.  She is wearing a light compression bra that she got from [employer].   UPPER EXTREMITY AROM/PROM: A/PROM RIGHT   eval   Shoulder extension 55  Shoulder flexion 165  Shoulder abduction 180  Shoulder internal rotation 40  Shoulder external rotation 90    (Blank rows = not tested)  A/PROM LEFT   eval  Shoulder extension 55  Shoulder flexion 140  Shoulder abduction 150  Shoulder internal rotation 20* unable to hold arm up   Shoulder external rotation 80    (Blank rows = not tested)  UPPER EXTREMITY STRENGTH: limited by pain on the left especailly in shoulder abduction   LLYMPHEDEMA ASSESSMENTS:  SURGERY TYPE/DATE: left lumpectomy 06/25/2022 0/5 nodes  NUMBER OF LYMPH NODES REMOVED: 5 CHEMOTHERAPY: no RADIATION:yes  HORMONE TREATMENT: yes INFECTIONS: no  LYMPHEDEMA ASSESSMENTS:   LANDMARK RIGHT  eval  10 cm proximal to olecranon process 28.5  Olecranon process 26  10 cm proximal to ulnar styloid process 19.5  Just proximal to ulnar styloid process 16  Across hand at thumb web space 19.5  At base of 2nd digit 6.2  (Blank rows = not tested)  LANDMARK LEFT  eval  10 cm proximal to olecranon process 28  Olecranon process 25.5  10 cm proximal to ulnar styloid process 19.5  Just proximal to ulnar styloid process 15.5  Across hand at thumb web space `9.5  At base of 2nd digit 6  (Blank rows = not tested)   TODAY'S TREATMENT:       12/07/22 Pulleys flexion x 54mn and scaption:  stopped due to trouble relaxing Yellow band row 2x 10 Bil extension yellow 2x 10 Bil ER yellow 2x 10 Flexion 1# bil with mirror to watch shoulder movement  Push up plus x 10  STM with cocoa butter to Lt UT, latissimus, pectoralis, upper  medial arm, axillary borders in supine with any Trigger point release as needed and then in sidelying to the latissimus, rhomboids PROM to tolerance all directions Joint mob: [place] AP and inferior glides grade IV- 4x30" each  11/30/22: Pulleys flexion x 28mn and scaption x 243m with initial cueing and cueing for no pinching Seated UT and LS stretch 2x15"  Yellow band row x 10 Bil extension yellow x 10 Bil ER yellow x 10 STM with cocoa butter to Lt UT, latissimus, pectoralis, upper medial arm, axillary borders in supine with any Trigger point release as needed and then in sidelying to the latissimus, rhomboids PROM to tolerance all directions Joint mob: GH[place]pherdP and inferior glides grade IV- 4x30" each  11/30/22: Pulleys flexion x 52m24mand scaption x 52mi75mith initial cueing and cueing for no pinching Seated UT and LS stretch 2x15"  Isometric straight arm 5" x 5 in/out/up/down Isometrics at 45 deg of abduction 5" x 5 x 2 IR/ER Added isometrics to HEP with performance of each 5" x 5 at the wall with a towel roll STM with cocoa butter to Lt UT, latissimus, pectoralis, upper medial arm, axillary borders in supine with any Trigger point release as needed and then in sidelying to the latissimus, rhomboids PROM to tolerance all directions Joint mob: GH [place]and inferior glides grade IV- 4x30" each      PATIENT EDUCATION:  Education details: meeks decompression and supine dowel shoulder flexion  Person educated: Patient Education method: ExplConsulting civil engineermoMedia plannerd Handouts Education comprehension: verbalized understanding and returned demonstration  HOME EXERCISE PROGRAM: Access Code: [email or web address] URL: [email or web address] Date:  11/30/2022 Prepared by: KaraShan Levansercises - Isometric Shoulder Flexion at Wall  - 1 x daily - 7 x weekly - 1 sets - 5-10 reps - 6-10sec hold - Isometric Shoulder Extension at Wall  - 1 x daily - 7 x weekly - 1 sets - 5-10 reps - 6-10 sec hold - Isometric Shoulder External Rotation at Wall  - 1 x daily - 7 x weekly - 1 sets - 5-10 reps - 6-10 sec hold - Standing Isometric Shoulder Internal Rotation at Doorway  - 1 x daily - 7 x weekly - 1 sets - 5-10 reps - 6-10 sec hold - Wall Push Up with Plus  - 1 x daily - 7 x weekly - 1 sets - 10 reps - no hold  ASSESSMENT: CLINICAL IMPRESSION:  Continued with POC and added band repetitions TE without increased pain.   OBJECTIVE IMPAIRMENTS: decreased activity tolerance, decreased coordination, decreased endurance, decreased knowledge of condition, decreased knowledge of use of DME, decreased ROM, decreased strength, increased fascial restrictions, impaired perceived functional ability, increased muscle spasms, impaired UE functional use, postural dysfunction, and pain.   ACTIVITY LIMITATIONS: reaching behind her and reaching overhead   PARTICIPATION LIMITATIONS: possibly family obligations   PERSONAL FACTORS: recent surgery, radiation, on hormone blocker are also affecting patient's functional outcome.   REHAB POTENTIAL: Good  CLINICAL DECISION MAKING: Stable/uncomplicated  EVALUATION COMPLEXITY: Low  GOALS: Goals reviewed with patient? Yes  LONG  TERM GOALS: Target date: 12/20/2022  Pt will decrease QuickDASH to < 10 indicating improvement in left UE function  Baseline:36.35 Goal status: INITIAL  2.  Pt will reports she can reach to backseat of car without pain  Baseline: painful  Goal status: INITIAL  3.  Pt will reports she is indepenent in home program for shoulder ROM and strengthening  Baseline: needs review  Goal status: INITIAL  4.  Pt will increrase left shoulder abduction to 160 degrees without pain  Baseline: 150 and  pain  Goal status: INITIAL  5.  Pt will be able to hold left arm up at 90 degrees of shoulder abduction without pain  Baseline: unable to hold arm up at 90 to test internal rotation due to pain  Goal status: INITIAL   PLAN:  PT FREQUENCY: 2x/week  PT DURATION: 4 weeks  PLANNED INTERVENTIONS: Therapeutic exercises, Therapeutic activity, Neuromuscular re-education, Patient/Family education, Self Care, Joint mobilization, and Manual lymph drainage. Manual technques, iontophoresis, dry needling if pt does not improve with manual work   PLAN FOR NEXT SESSION: schedule out SOZO, do manual work to tight soft tissue at  pecs, upper traps.lats and scapular area.  PROM and stretching to left shoulder, progress home program.    Stark Bray, PT 12/07/2022, 10:52 AM

## 2022-12-09 ENCOUNTER — Encounter: Payer: Self-pay | Admitting: Rehabilitation

## 2022-12-09 ENCOUNTER — Ambulatory Visit: Payer: PPO | Admitting: Rehabilitation

## 2022-12-09 DIAGNOSIS — R293 Abnormal posture: Secondary | ICD-10-CM

## 2022-12-09 DIAGNOSIS — M25512 Pain in left shoulder: Secondary | ICD-10-CM

## 2022-12-09 DIAGNOSIS — C50512 Malignant neoplasm of lower-outer quadrant of left female breast: Secondary | ICD-10-CM | POA: Diagnosis not present

## 2022-12-09 DIAGNOSIS — M25612 Stiffness of left shoulder, not elsewhere classified: Secondary | ICD-10-CM

## 2022-12-09 DIAGNOSIS — M6281 Muscle weakness (generalized): Secondary | ICD-10-CM

## 2022-12-09 DIAGNOSIS — Z9189 Other specified personal risk factors, not elsewhere classified: Secondary | ICD-10-CM

## 2022-12-09 DIAGNOSIS — Z483 Aftercare following surgery for neoplasm: Secondary | ICD-10-CM

## 2022-12-09 NOTE — Therapy (Signed)
OUTPATIENT PHYSICAL THERAPY ONCOLOGY TREATMENT  Patient Name: Carol Harris MRN: [id number] DOB:Apr 17, 1956, 67 y.o., female Today's Date: 12/09/2022  END OF SESSION:  PT End of Session - 12/09/22 0957     Visit Number 7    Number of Visits 9    Date for PT Re-Evaluation 12/20/22    PT Start Time 1000    PT Stop Time 1050    PT Time Calculation (min) 50 min    Activity Tolerance Patient tolerated treatment well    Behavior During Therapy [facility] for tasks assessed/performed              Past Medical History:  Diagnosis Date   Anxiety    Colon polyp    GERD (gastroesophageal reflux disease)    HTN (hypertension), benign 09/05/2011   Hyperlipidemia, mixed 09/05/2011   Leukopenia 09/05/2011   Sleep apnea    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[place]) 12/19/2013   no defecits   Past Surgical History:  Procedure Laterality Date   BREAST LUMPECTOMY WITH RADIOACTIVE SEED AND SENTINEL LYMPH NODE BIOPSY Left 06/25/2022   Procedure: LEFT BREAST LUMPECTOMY WITH RADIOACTIVE SEED AND SENTINEL LYMPH NODE BIOPSY;  Surgeon: Jovita Kussmaul, MD;  Location: [place];  Service: General;  Laterality: Left;   KNEE ARTHROSCOPY Right 2005 approx   torn meniscus   TUBAL LIGATION     twice (first failed)   uterine ablation     Patient Active Problem List   Diagnosis Date Noted   Malignant neoplasm of lower-outer quadrant of left breast of female, estrogen receptor positive ([place]) 06/16/2022   Genetic testing 06/16/2022   Restless leg syndrome 11/08/2018   Left ventricular hypertrophy 11/08/2018   Depression 11/03/2018   Family history of colon cancer in mother 11/03/2018   History of vitamin D deficiency 11/03/2018   Night terrors, adult 11/03/2018   History of CVA (cerebrovascular accident) 07/12/2014   Obstructive sleep apnea 01/08/2014   GERD (gastroesophageal reflux disease) 12/19/2013   Leukopenia 09/05/2011   HTN (hypertension), benign 09/05/2011   Hyperlipidemia, mixed  09/05/2011    PCP: Rita Ohara, MD  REFERRING PROVIDER: Nicholas Lose, MD  REFERRING DIAG: C50.512,Z17.0 (ICD-10-CM) - Malignant neoplasm of lower-outer quadrant of left breast of female, estrogen receptor positive ([place])  THERAPY DIAG:  Stiffness of left shoulder, not elsewhere classified  Left shoulder pain, unspecified chronicity  Abnormal posture  Malignant neoplasm of lower-outer quadrant of left breast of female, estrogen receptor positive ([place])  Aftercare following surgery for neoplasm  At risk for lymphedema  Muscle weakness (generalized)  ONSET DATE: 06/25/2022  Rationale for Evaluation and Treatment: Rehabilitation  SUBJECTIVE:  SUBJECTIVE STATEMENT:  I was fine after last time. It seems to be worse in the morning.    EVAL: pt states she can/t reach reach around to the back seat. And she can't do the exercises she has always done with her arm. She still has tingling and pain  in her upper arm.  She sometimes has to take a tylenol at night. She had been doing yoga   PERTINENT HISTORY: 06/25/2022: Left lumpectomy: Grade 2 IDC 1.7 cm, margins negative, 0/5 sentinel nodes negative ER 95%, PR 80%, HER2 negative, Ki-67 15%  Adjuvant radiation completed 09/14/2022 No chemo, but is on letrozole, pt did not tolerate anastrozole    PAIN:  Are you having pain? No NPRS scale: 0/10 at rest  PRECAUTIONS: at risk for lymphedemaa   WEIGHT BEARING RESTRICTIONS: No  FALLS:  Has patient fallen in last 6 months? No  LIVING ENVIRONMENT: Lives with: liives with spouse, no pets  Lives in: House/apartment Stairs: Yes; up to husband's study  Has following equipment at home: None  OCCUPATION: retired   Psychologist, clinical: travel, exercise , spend time with family   PATIENT GOALS: to get rid of shoulder pain  and get back to exercising and get movement and strength back    OBJECTIVE: PALPATION:  EVAL: Firmness and pain in pec major with pinpoint tenderness at corocoid process at pec minor insertions. She also has palpable tightness in left upper trap, lat and scapular muscles.   OBSERVATIONS / OTHER ASSESSMENTS:  EVAL: scapular asymmetry in bilateral shoulder elevation .Pt has discoloration in left axilla from radiation and still has some bruising and fullness in left breast medical to nipple and scar.  She is wearing a light compression bra that she got from [employer].   UPPER EXTREMITY AROM/PROM: A/PROM RIGHT   eval   Shoulder extension 55  Shoulder flexion 165  Shoulder abduction 180  Shoulder internal rotation 40  Shoulder external rotation 90    (Blank rows = not tested)  A/PROM LEFT   eval  Shoulder extension 55  Shoulder flexion 140  Shoulder abduction 150  Shoulder internal rotation 20* unable to hold arm up   Shoulder external rotation 80    (Blank rows = not tested)  UPPER EXTREMITY STRENGTH: limited by pain on the left especailly in shoulder abduction   LLYMPHEDEMA ASSESSMENTS:  SURGERY TYPE/DATE: left lumpectomy 06/25/2022 0/5 nodes  NUMBER OF LYMPH NODES REMOVED: 5 CHEMOTHERAPY: no RADIATION:yes  HORMONE TREATMENT: yes INFECTIONS: no  LYMPHEDEMA ASSESSMENTS:   LANDMARK RIGHT  eval  10 cm proximal to olecranon process 28.5  Olecranon process 26  10 cm proximal to ulnar styloid process 19.5  Just proximal to ulnar styloid process 16  Across hand at thumb web space 19.5  At base of 2nd digit 6.2  (Blank rows = not tested)  LANDMARK LEFT  eval  10 cm proximal to olecranon process 28  Olecranon process 25.5  10 cm proximal to ulnar styloid process 19.5  Just proximal to ulnar styloid process 15.5  Across hand at thumb web space `9.5  At base of 2nd digit 6  (Blank rows = not tested)   TODAY'S TREATMENT:  12/09/22 Pulleys flexion x 58mn and  scaption: stopped due to trouble relaxing Yellow band row 2x 10 Bil extension yellow 2x 10 Bil ER yellow 2x 10 Yellow band horizontal abd STM with cocoa butter to Lt UT, latissimus, pectoralis, upper medial arm, axillary borders in supine with any Trigger point release as needed  PROM to tolerance all directions still needing vcs to relax and some pinch with abduction when not relaxed  Joint mob: [place] AP and inferior glides grade IV- 4x30" each       12/07/22 Pulleys flexion x 26mn and scaption: stopped due to trouble relaxing Yellow band row 2x 10 Bil extension yellow 2x 10 Bil ER yellow 2x 10 Flexion 1# bil with mirror to watch shoulder movement  Push up plus x 10  STM with cocoa butter to Lt UT, latissimus, pectoralis, upper medial arm, axillary borders in supine with any Trigger point release as needed and then in sidelying to the latissimus, rhomboids PROM to tolerance all directions Joint mob: GU[place]AP and inferior glides grade IV- 4x30" each  11/30/22: Pulleys flexion x 2876m and scaption x 76m17mwith initial cueing and cueing for no pinching Seated UT and LS stretch 2x15"  Yellow band row x 10 Bil extension yellow x 10 Bil ER yellow x 10 STM with cocoa butter to Lt UT, latissimus, pectoralis, upper medial arm, axillary borders in supine with any Trigger point release as needed and then in sidelying to the latissimus, rhomboids PROM to tolerance all directions Joint mob: GH [place] and inferior glides grade IV- 4x30" each  11/30/22: Pulleys flexion x 76mi18mnd scaption x 76min58mth initial cueing and cueing for no pinching Seated UT and LS stretch 2x15"  Isometric straight arm 5" x 5 in/out/up/down Isometrics at 45 deg of abduction 5" x 5 x 2 IR/ER Added isometrics to HEP with performance of each 5" x 5 at the wall with a towel roll STM with cocoa butter to Lt UT, latissimus, pectoralis, upper medial arm, axillary borders in supine with any Trigger point release as needed and then in  sidelying to the latissimus, rhomboids PROM to tolerance all directions Joint mob: GH APBancroftnd inferior glides grade IV- 4x30" each      PATIENT EDUCATION:  Education details: meeks decompression and supine dowel shoulder flexion  Person educated: Patient Education method: ExplaConsulting civil engineeronMedia planner Handouts Education comprehension: verbalized understanding and returned demonstration  HOME EXERCISE PROGRAM:  Access Code: [email or web address] URL: [email or web address] Date: 12/09/2022 Prepared by: Frankey Botting Shan Levansrcises - Wall Push Up with Plus  - 1 x daily - 7 x weekly - 1 sets - 10 reps - no hold - Standing Row with Anchored Resistance Band with PLB  - 1 x daily - 7 x weekly - 1-3 sets - 10 reps - no hold - Shoulder extension with resistance - Neutral  - 1 x daily - 7 x weekly - 1-3 sets - 10 reps - no hold - Standing Shoulder External Rotation with Resistance  - 1 x daily - 7 x weekly - 1-3 sets - 10 reps - no hold - Standing Shoulder Horizontal Abduction with Resistance  - 1 x daily - 7 x weekly - 1-3 sets - 10 reps - no hold - Standing Pec Stretch at Wall  - 1 x daily - 7 x weekly - 1-3 sets - 10 reps - 20-30 seconds hold ASSESSMENT: CLINICAL IMPRESSION:  Continued with POC and updated HEP. Still having some impingement signs with abduction and reaching behind the body but overall improving.    OBJECTIVE IMPAIRMENTS: decreased activity tolerance, decreased coordination, decreased endurance, decreased knowledge of condition, decreased knowledge of use of DME, decreased ROM, decreased strength, increased fascial restrictions, impaired perceived functional ability, increased muscle spasms, impaired UE functional use, postural dysfunction, and pain.   ACTIVITY LIMITATIONS: reaching  behind her and reaching overhead   PARTICIPATION LIMITATIONS: possibly family obligations   PERSONAL FACTORS: recent surgery, radiation, on hormone blocker are also affecting patient's functional  outcome.   REHAB POTENTIAL: Good  CLINICAL DECISION MAKING: Stable/uncomplicated  EVALUATION COMPLEXITY: Low  GOALS: Goals reviewed with patient? Yes  LONG  TERM GOALS: Target date: 12/20/2022  Pt will decrease QuickDASH to < 10 indicating improvement in left UE function  Baseline:36.35 Goal status: INITIAL  2.  Pt will reports she can reach to backseat of car without pain  Baseline: painful  Goal status: INITIAL  3.  Pt will reports she is indepenent in home program for shoulder ROM and strengthening  Baseline: needs review  Goal status: INITIAL  4.  Pt will increrase left shoulder abduction to 160 degrees without pain  Baseline: 150 and pain  Goal status: INITIAL  5.  Pt will be able to hold left arm up at 90 degrees of shoulder abduction without pain  Baseline: unable to hold arm up at 90 to test internal rotation due to pain  Goal status: INITIAL   PLAN:  PT FREQUENCY: 2x/week  PT DURATION: 4 weeks  PLANNED INTERVENTIONS: Therapeutic exercises, Therapeutic activity, Neuromuscular re-education, Patient/Family education, Self Care, Joint mobilization, and Manual lymph drainage. Manual technques, iontophoresis, dry needling if pt does not improve with manual work   PLAN FOR NEXT SESSION: schedule out SOZO, do manual work to tight soft tissue at  pecs, upper traps.lats and scapular area.  PROM and stretching to left shoulder, progress home program.    Stark Bray, PT 12/09/2022, 10:51 AM

## 2022-12-13 ENCOUNTER — Encounter: Payer: Self-pay | Admitting: Rehabilitation

## 2022-12-13 ENCOUNTER — Ambulatory Visit: Payer: PPO | Attending: Hematology and Oncology | Admitting: Rehabilitation

## 2022-12-13 DIAGNOSIS — C50512 Malignant neoplasm of lower-outer quadrant of left female breast: Secondary | ICD-10-CM | POA: Diagnosis not present

## 2022-12-13 DIAGNOSIS — Z9189 Other specified personal risk factors, not elsewhere classified: Secondary | ICD-10-CM | POA: Diagnosis not present

## 2022-12-13 DIAGNOSIS — M6281 Muscle weakness (generalized): Secondary | ICD-10-CM | POA: Insufficient documentation

## 2022-12-13 DIAGNOSIS — Z483 Aftercare following surgery for neoplasm: Secondary | ICD-10-CM | POA: Diagnosis not present

## 2022-12-13 DIAGNOSIS — M25512 Pain in left shoulder: Secondary | ICD-10-CM | POA: Insufficient documentation

## 2022-12-13 DIAGNOSIS — Z170 Estrogen receptor positive status [ER+]: Secondary | ICD-10-CM | POA: Insufficient documentation

## 2022-12-13 DIAGNOSIS — R293 Abnormal posture: Secondary | ICD-10-CM | POA: Insufficient documentation

## 2022-12-13 DIAGNOSIS — M25612 Stiffness of left shoulder, not elsewhere classified: Secondary | ICD-10-CM | POA: Insufficient documentation

## 2022-12-13 NOTE — Therapy (Signed)
OUTPATIENT PHYSICAL THERAPY ONCOLOGY TREATMENT  Patient Name: Carol Harris MRN: [id number] DOB:10/28/1955, 67 y.o., female Today's Date: 12/14/2022  END OF SESSION:  PT End of Session - 12/13/22 1059     Visit Number 8    Number of Visits 9    Date for PT Re-Evaluation 12/20/22    PT Start Time 1100    PT Stop Time 1150    PT Time Calculation (min) 50 min    Activity Tolerance Patient tolerated treatment well    Behavior During Therapy [facility] [place] for tasks assessed/performed              Past Medical History:  Diagnosis Date   Anxiety    Colon polyp    GERD (gastroesophageal reflux disease)    HTN (hypertension), benign 09/05/2011   Hyperlipidemia, mixed 09/05/2011   Leukopenia 09/05/2011   Sleep apnea    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[place]) 12/19/2013   no defecits   Past Surgical History:  Procedure Laterality Date   BREAST LUMPECTOMY WITH RADIOACTIVE SEED AND SENTINEL LYMPH NODE BIOPSY Left 06/25/2022   Procedure: LEFT BREAST LUMPECTOMY WITH RADIOACTIVE SEED AND SENTINEL LYMPH NODE BIOPSY;  Surgeon: Jovita Kussmaul, MD;  Location: [place];  Service: General;  Laterality: Left;   KNEE ARTHROSCOPY Right 2005 approx   torn meniscus   TUBAL LIGATION     twice (first failed)   uterine ablation     Patient Active Problem List   Diagnosis Date Noted   Malignant neoplasm of lower-outer quadrant of left breast of female, estrogen receptor positive ([place]) 06/16/2022   Genetic testing 06/16/2022   Restless leg syndrome 11/08/2018   Left ventricular hypertrophy 11/08/2018   Depression 11/03/2018   Family history of colon cancer in mother 11/03/2018   History of vitamin D deficiency 11/03/2018   Night terrors, adult 11/03/2018   History of CVA (cerebrovascular accident) 07/12/2014   Obstructive sleep apnea 01/08/2014   GERD (gastroesophageal reflux disease) 12/19/2013   Leukopenia 09/05/2011   HTN (hypertension), benign 09/05/2011   Hyperlipidemia, mixed  09/05/2011    PCP: Rita Ohara, MD  REFERRING PROVIDER: Nicholas Lose, MD  REFERRING DIAG: C50.512,Z17.0 (ICD-10-CM) - Malignant neoplasm of lower-outer quadrant of left breast of female, estrogen receptor positive ([place])  THERAPY DIAG:  Stiffness of left shoulder, not elsewhere classified  Left shoulder pain, unspecified chronicity  Abnormal posture  Malignant neoplasm of lower-outer quadrant of left breast of female, estrogen receptor positive ([place])  Aftercare following surgery for neoplasm  Muscle weakness (generalized)  At risk for lymphedema  ONSET DATE: 06/25/2022  Rationale for Evaluation and Treatment: Rehabilitation  SUBJECTIVE:  SUBJECTIVE STATEMENT:  It feeling really good and then it was hurting again in the morning.    EVAL: pt states she can/t reach reach around to the back seat. And she can't do the exercises she has always done with her arm. She still has tingling and pain  in her upper arm.  She sometimes has to take a tylenol at night. She had been doing yoga   PERTINENT HISTORY: 06/25/2022: Left lumpectomy: Grade 2 IDC 1.7 cm, margins negative, 0/5 sentinel nodes negative ER 95%, PR 80%, HER2 negative, Ki-67 15%  Adjuvant radiation completed 09/14/2022 No chemo, but is on letrozole, pt did not tolerate anastrozole    PAIN:  Are you having pain? No NPRS scale: 0/10 at rest  PRECAUTIONS: at risk for lymphedemaa   WEIGHT BEARING RESTRICTIONS: No  FALLS:  Has patient fallen in last 6 months? No  LIVING ENVIRONMENT: Lives with: liives with spouse, no pets  Lives in: House/apartment Stairs: Yes; up to husband's study  Has following equipment at home: None  OCCUPATION: retired   Psychologist, clinical: travel, exercise , spend time with family   PATIENT GOALS: to get rid of shoulder  pain and get back to exercising and get movement and strength back    OBJECTIVE: PALPATION:  EVAL: Firmness and pain in pec major with pinpoint tenderness at corocoid process at pec minor insertions. She also has palpable tightness in left upper trap, lat and scapular muscles.   OBSERVATIONS / OTHER ASSESSMENTS:  EVAL: scapular asymmetry in bilateral shoulder elevation .Pt has discoloration in left axilla from radiation and still has some bruising and fullness in left breast medical to nipple and scar.  She is wearing a light compression bra that she got from [employer].   UPPER EXTREMITY AROM/PROM: A/PROM RIGHT   eval   Shoulder extension 55  Shoulder flexion 165  Shoulder abduction 180  Shoulder internal rotation 40  Shoulder external rotation 90    (Blank rows = not tested)  A/PROM LEFT   eval  Shoulder extension 55  Shoulder flexion 140  Shoulder abduction 150  Shoulder internal rotation 20* unable to hold arm up   Shoulder external rotation 80    (Blank rows = not tested)  UPPER EXTREMITY STRENGTH: limited by pain on the left especailly in shoulder abduction   LLYMPHEDEMA ASSESSMENTS:  SURGERY TYPE/DATE: left lumpectomy 06/25/2022 0/5 nodes  NUMBER OF LYMPH NODES REMOVED: 5 CHEMOTHERAPY: no RADIATION:yes  HORMONE TREATMENT: yes INFECTIONS: no  LYMPHEDEMA ASSESSMENTS:   LANDMARK RIGHT  eval  10 cm proximal to olecranon process 28.5  Olecranon process 26  10 cm proximal to ulnar styloid process 19.5  Just proximal to ulnar styloid process 16  Across hand at thumb web space 19.5  At base of 2nd digit 6.2  (Blank rows = not tested)  LANDMARK LEFT  eval  10 cm proximal to olecranon process 28  Olecranon process 25.5  10 cm proximal to ulnar styloid process 19.5  Just proximal to ulnar styloid process 15.5  Across hand at thumb web space `9.5  At base of 2nd digit 6  (Blank rows = not tested)   TODAY'S TREATMENT:  12/13/22 Pulleys flexion x 19mn   Yellow band row 2x 10 - still requiring vcs for relaxation Bil extension yellow 2x 10 Bil ER yellow 2x 10 Yellow band horizontal abd 2x10 Push up plus x 15  STM with cocoa butter to Lt UT, latissimus, pectoralis, upper medial arm, axillary borders in supine with  any Trigger point release as needed PROM to tolerance all directions still needing vcs to relax and some pinch with abduction when not relaxed  Joint mob: [place] AP and inferior glides grade IV- 4x30" each  12/09/22 Pulleys flexion x 44mn and scaption: stopped due to trouble relaxing Yellow band row 2x 10 Bil extension yellow 2x 10 Bil ER yellow 2x 10 Yellow band horizontal abd STM with cocoa butter to Lt UT, latissimus, pectoralis, upper medial arm, axillary borders in supine with any Trigger point release as needed PROM to tolerance all directions still needing vcs to relax and some pinch with abduction when not relaxed  Joint mob: [place] and inferior glides grade IV- 4x30" each       12/07/22 Pulleys flexion x 238m and scaption: stopped due to trouble relaxing Yellow band row 2x 10 Bil extension yellow 2x 10 Bil ER yellow 2x 10 Flexion 1# bil with mirror to watch shoulder movement  Push up plus x 10  STM with cocoa butter to Lt UT, latissimus, pectoralis, upper medial arm, axillary borders in supine with any Trigger point release as needed and then in sidelying to the latissimus, rhomboids PROM to tolerance all directions Joint mob: GHMorseP and inferior glides grade IV- 4x30" each  11/30/22: Pulleys flexion x 8m40mand scaption x 8mi61mith initial cueing and cueing for no pinching Seated UT and LS stretch 2x15"  Yellow band row x 10 Bil extension yellow x 10 Bil ER yellow x 10 STM with cocoa butter to Lt UT, latissimus, pectoralis, upper medial arm, axillary borders in supine with any Trigger point release as needed and then in sidelying to the latissimus, rhomboids PROM to tolerance all directions Joint mob: GH [place]  inferior glides grade IV- 4x30" each  11/30/22: Pulleys flexion x 8min66md scaption x 8min 15mh initial cueing and cueing for no pinching Seated UT and LS stretch 2x15"  Isometric straight arm 5" x 5 in/out/up/down Isometrics at 45 deg of abduction 5" x 5 x 2 IR/ER Added isometrics to HEP with performance of each 5" x 5 at the wall with a towel roll STM with cocoa butter to Lt UT, latissimus, pectoralis, upper medial arm, axillary borders in supine with any Trigger point release as needed and then in sidelying to the latissimus, rhomboids PROM to tolerance all directions Joint mob: GH AP [place] inferior glides grade IV- 4x30" each      PATIENT EDUCATION:  Education details: meeks decompression and supine dowel shoulder flexion  Person educated: Patient Education method: ExplanConsulting civil engineernsMedia plannerHandouts Education comprehension: verbalized understanding and returned demonstration  HOME EXERCISE PROGRAM:  Access Code: [email or web address] URL: [email or web address] Date: 12/09/2022 Prepared by: Kyrese Gartman TShan Levanscises - Wall Push Up with Plus  - 1 x daily - 7 x weekly - 1 sets - 10 reps - no hold - Standing Row with Anchored Resistance Band with PLB  - 1 x daily - 7 x weekly - 1-3 sets - 10 reps - no hold - Shoulder extension with resistance - Neutral  - 1 x daily - 7 x weekly - 1-3 sets - 10 reps - no hold - Standing Shoulder External Rotation with Resistance  - 1 x daily - 7 x weekly - 1-3 sets - 10 reps - no hold - Standing Shoulder Horizontal Abduction with Resistance  - 1 x daily - 7 x weekly - 1-3 sets - 10 reps - no hold - Standing Pec Stretch at Wall  -  1 x daily - 7 x weekly - 1-3 sets - 10 reps - 20-30 seconds hold ASSESSMENT: CLINICAL IMPRESSION:  Continued with POC and updated HEP. Still having some impingement signs with abduction and reaching behind the body but overall improving.    OBJECTIVE IMPAIRMENTS: decreased activity tolerance, decreased coordination,  decreased endurance, decreased knowledge of condition, decreased knowledge of use of DME, decreased ROM, decreased strength, increased fascial restrictions, impaired perceived functional ability, increased muscle spasms, impaired UE functional use, postural dysfunction, and pain.   ACTIVITY LIMITATIONS: reaching behind her and reaching overhead   PARTICIPATION LIMITATIONS: possibly family obligations   PERSONAL FACTORS: recent surgery, radiation, on hormone blocker are also affecting patient's functional outcome.   REHAB POTENTIAL: Good  CLINICAL DECISION MAKING: Stable/uncomplicated  EVALUATION COMPLEXITY: Low  GOALS: Goals reviewed with patient? Yes  LONG  TERM GOALS: Target date: 12/20/2022  Pt will decrease QuickDASH to < 10 indicating improvement in left UE function  Baseline:36.35 Goal status: INITIAL  2.  Pt will reports she can reach to backseat of car without pain  Baseline: painful  Goal status: INITIAL  3.  Pt will reports she is indepenent in home program for shoulder ROM and strengthening  Baseline: needs review  Goal status: INITIAL  4.  Pt will increrase left shoulder abduction to 160 degrees without pain  Baseline: 150 and pain  Goal status: INITIAL  5.  Pt will be able to hold left arm up at 90 degrees of shoulder abduction without pain  Baseline: unable to hold arm up at 90 to test internal rotation due to pain  Goal status: INITIAL   PLAN:  PT FREQUENCY: 2x/week  PT DURATION: 4 weeks  PLANNED INTERVENTIONS: Therapeutic exercises, Therapeutic activity, Neuromuscular re-education, Patient/Family education, Self Care, Joint mobilization, and Manual lymph drainage. Manual technques, iontophoresis, dry needling if pt does not improve with manual work   PLAN FOR NEXT SESSION: schedule out SOZO, do manual work to tight soft tissue at  pecs, upper traps.lats and scapular area.  PROM and stretching to left shoulder, progress home program.    Stark Bray,  PT 12/14/2022, 12:57 PM

## 2022-12-15 ENCOUNTER — Ambulatory Visit: Payer: PPO | Admitting: Rehabilitation

## 2022-12-15 ENCOUNTER — Encounter: Payer: Self-pay | Admitting: Rehabilitation

## 2022-12-15 DIAGNOSIS — R293 Abnormal posture: Secondary | ICD-10-CM

## 2022-12-15 DIAGNOSIS — M25612 Stiffness of left shoulder, not elsewhere classified: Secondary | ICD-10-CM

## 2022-12-15 DIAGNOSIS — Z483 Aftercare following surgery for neoplasm: Secondary | ICD-10-CM

## 2022-12-15 DIAGNOSIS — Z9189 Other specified personal risk factors, not elsewhere classified: Secondary | ICD-10-CM

## 2022-12-15 DIAGNOSIS — M25512 Pain in left shoulder: Secondary | ICD-10-CM

## 2022-12-15 DIAGNOSIS — M6281 Muscle weakness (generalized): Secondary | ICD-10-CM

## 2022-12-15 DIAGNOSIS — Z170 Estrogen receptor positive status [ER+]: Secondary | ICD-10-CM

## 2022-12-15 NOTE — Therapy (Signed)
OUTPATIENT PHYSICAL THERAPY ONCOLOGY TREATMENT  Patient Name: Carol Harris MRN: [id number] DOB:[birth year], 67 y.o., female Today's Date: 12/15/2022  END OF SESSION:  PT End of Session - 12/15/22 1000     Visit Number 9    Number of Visits 17    Date for PT Re-Evaluation 01/12/23    PT Start Time 1000    PT Stop Time 1050    PT Time Calculation (min) 50 min    Activity Tolerance Patient tolerated treatment well    Behavior During Therapy [facility] for tasks assessed/performed              Past Medical History:  Diagnosis Date   Anxiety    Colon polyp    GERD (gastroesophageal reflux disease)    HTN (hypertension), benign 09/05/2011   Hyperlipidemia, mixed 09/05/2011   Leukopenia 09/05/2011   Sleep apnea    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/19/2013   no defecits   Past Surgical History:  Procedure Laterality Date   BREAST LUMPECTOMY WITH RADIOACTIVE SEED AND SENTINEL LYMPH NODE BIOPSY Left 06/25/2022   Procedure: LEFT BREAST LUMPECTOMY WITH RADIOACTIVE SEED AND SENTINEL LYMPH NODE BIOPSY;  Surgeon: Jovita Kussmaul, MD;  Location: [place];  Service: General;  Laterality: Left;   KNEE ARTHROSCOPY Right 2005 approx   torn meniscus   TUBAL LIGATION     twice (first failed)   uterine ablation     Patient Active Problem List   Diagnosis Date Noted   Malignant neoplasm of lower-outer quadrant of left breast of female, estrogen receptor positive ([place]) 06/16/2022   Genetic testing 06/16/2022   Restless leg syndrome 11/08/2018   Left ventricular hypertrophy 11/08/2018   Depression 11/03/2018   Family history of colon cancer in mother 11/03/2018   History of vitamin D deficiency 11/03/2018   Night terrors, adult 11/03/2018   History of CVA (cerebrovascular accident) 07/12/2014   Obstructive sleep apnea 01/08/2014   GERD (gastroesophageal reflux disease) 12/19/2013   Leukopenia 09/05/2011   HTN (hypertension), benign 09/05/2011   Hyperlipidemia, mixed  09/05/2011    PCP: Rita Ohara, MD  REFERRING PROVIDER: Nicholas Lose, MD  REFERRING DIAG: C50.512,Z17.0 (ICD-10-CM) - Malignant neoplasm of lower-outer quadrant of left breast of female, estrogen receptor positive ([place])  THERAPY DIAG:  Stiffness of left shoulder, not elsewhere classified  Left shoulder pain, unspecified chronicity  Abnormal posture  Malignant neoplasm of lower-outer quadrant of left breast of female, estrogen receptor positive ([place])  Aftercare following surgery for neoplasm  Muscle weakness (generalized)  At risk for lymphedema  ONSET DATE: 06/25/2022  Rationale for Evaluation and Treatment: Rehabilitation  SUBJECTIVE:  SUBJECTIVE STATEMENT:   I could grab my seatbelt and it was fine, I still can't open the car door all the way.    EVAL: pt states she can/t reach reach around to the back seat. And she can't do the exercises she has always done with her arm. She still has tingling and pain  in her upper arm.  She sometimes has to take a tylenol at night. She had been doing yoga   PERTINENT HISTORY: 06/25/2022: Left lumpectomy: Grade 2 IDC 1.7 cm, margins negative, 0/5 sentinel nodes negative ER 95%, PR 80%, HER2 negative, Ki-67 15%  Adjuvant radiation completed 09/14/2022 No chemo, but is on letrozole, pt did not tolerate anastrozole    PAIN:  Are you having pain? No NPRS scale: 0/10 at rest  PRECAUTIONS: at risk for lymphedemaa   WEIGHT BEARING RESTRICTIONS: No  FALLS:  Has patient fallen in last 6 months? No  LIVING ENVIRONMENT: Lives with: liives with spouse, no pets  Lives in: House/apartment Stairs: Yes; up to husband's study  Has following equipment at home: None  OCCUPATION: retired   Psychologist, clinical: travel, exercise , spend time with family   PATIENT GOALS: to get  rid of shoulder pain and get back to exercising and get movement and strength back    OBJECTIVE: PALPATION:  EVAL: Firmness and pain in pec major with pinpoint tenderness at corocoid process at pec minor insertions. She also has palpable tightness in left upper trap, lat and scapular muscles.   OBSERVATIONS / OTHER ASSESSMENTS:  EVAL: scapular asymmetry in bilateral shoulder elevation .Pt has discoloration in left axilla from radiation and still has some bruising and fullness in left breast medical to nipple and scar.  She is wearing a light compression bra that she got from [employer].   UPPER EXTREMITY AROM/PROM: A/PROM RIGHT   eval   Shoulder extension 55  Shoulder flexion 165  Shoulder abduction 180  Shoulder internal rotation 40  Shoulder external rotation 90    (Blank rows = not tested)  A/PROM LEFT   eval 12/15/22  Shoulder extension 55   Shoulder flexion 140 150  Shoulder abduction 150 160  Shoulder internal rotation 20* unable to hold arm up  Behind the back normal  Shoulder external rotation 80 80    (Blank rows = not tested)  UPPER EXTREMITY STRENGTH:  4+/5 without pain bil  LLYMPHEDEMA ASSESSMENTS:  SURGERY TYPE/DATE: left lumpectomy 06/25/2022 0/5 nodes  NUMBER OF LYMPH NODES REMOVED: 5 CHEMOTHERAPY: no RADIATION:yes  HORMONE TREATMENT: yes INFECTIONS: no  LYMPHEDEMA ASSESSMENTS:   LANDMARK RIGHT  eval  10 cm proximal to olecranon process 28.5  Olecranon process 26  10 cm proximal to ulnar styloid process 19.5  Just proximal to ulnar styloid process 16  Across hand at thumb web space 19.5  At base of 2nd digit 6.2  (Blank rows = not tested)  LANDMARK LEFT  eval  10 cm proximal to olecranon process 28  Olecranon process 25.5  10 cm proximal to ulnar styloid process 19.5  Just proximal to ulnar styloid process 15.5  Across hand at thumb web space `9.5  At base of 2nd digit 6  (Blank rows = not tested)   TODAY'S TREATMENT:   12/13/22 Rechecked AROM and goals x 31mn Pulleys flexion x 273m and abduction x 70m6m(able to do this today) Yellow band row 2x 10 Bil extension yellow 2x 10 Bil ER yellow 2x 10 Yellow band horizontal abd with pinch today Supine Diagonal 2x5  bil with initial cueing  Small amplitude alphabet with 1# Lt only PROM to tolerance all directions still needing vcs to relax and some pinch with abduction when not relaxed  Joint mob: [place] AP and inferior glides grade IV- 4x30" each  12/09/22 Pulleys flexion x 79mn and scaption: stopped due to trouble relaxing Yellow band row 2x 10 Bil extension yellow 2x 10 Bil ER yellow 2x 10 Yellow band horizontal abd STM with cocoa butter to Lt UT, latissimus, pectoralis, upper medial arm, axillary borders in supine with any Trigger point release as needed PROM to tolerance all directions still needing vcs to relax and some pinch with abduction when not relaxed  Joint mob: [place] and inferior glides grade IV- 4x30" each       12/07/22 Pulleys flexion x 212m and scaption: stopped due to trouble relaxing Yellow band row 2x 10 Bil extension yellow 2x 10 Bil ER yellow 2x 10 Flexion 1# bil with mirror to watch shoulder movement  Push up plus x 10  STM with cocoa butter to Lt UT, latissimus, pectoralis, upper medial arm, axillary borders in supine with any Trigger point release as needed and then in sidelying to the latissimus, rhomboids PROM to tolerance all directions Joint mob: [email or web address] and inferior glides grade IV- 4x30" each  PATIENT EDUCATION:  Education details: meeks decompression and supine dowel shoulder flexion  Person educated: Patient Education method: ExConsulting civil engineerDeMedia plannerand Handouts Education comprehension: verbalized understanding and returned demonstration  HOME EXERCISE PROGRAM:  Access Code: [email or web address] URL: [email or web address] Date: 12/09/2022 Prepared by: KaShan LevansExercises - Wall Push Up with Plus  - 1 x daily -  7 x weekly - 1 sets - 10 reps - no hold - Standing Row with Anchored Resistance Band with PLB  - 1 x daily - 7 x weekly - 1-3 sets - 10 reps - no hold - Shoulder extension with resistance - Neutral  - 1 x daily - 7 x weekly - 1-3 sets - 10 reps - no hold - Standing Shoulder External Rotation with Resistance  - 1 x daily - 7 x weekly - 1-3 sets - 10 reps - no hold - Standing Shoulder Horizontal Abduction with Resistance  - 1 x daily - 7 x weekly - 1-3 sets - 10 reps - no hold - Standing Pec Stretch at Wall  - 1 x daily - 7 x weekly - 1-3 sets - 10 reps - 20-30 seconds hold ASSESSMENT: CLINICAL IMPRESSION:  Extended POC.  Pt is pleased with progress so far but she is still limited in opening her car door and reaching in the morning.  She has met some goals and progressing towards the rest.    OBJECTIVE IMPAIRMENTS: decreased activity tolerance, decreased coordination, decreased endurance, decreased knowledge of condition, decreased knowledge of use of DME, decreased ROM, decreased strength, increased fascial restrictions, impaired perceived functional ability, increased muscle spasms, impaired UE functional use, postural dysfunction, and pain.   ACTIVITY LIMITATIONS: reaching behind her and reaching overhead   PARTICIPATION LIMITATIONS: possibly family obligations   PERSONAL FACTORS: recent surgery, radiation, on hormone blocker are also affecting patient's functional outcome.   REHAB POTENTIAL: Good  CLINICAL DECISION MAKING: Stable/uncomplicated  EVALUATION COMPLEXITY: Low  GOALS: Goals reviewed with patient? Yes  LONG  TERM GOALS: Target date: 12/20/2022  Pt will decrease QuickDASH to < 10 indicating improvement in left UE function  Baseline:36.35, 15% Goal status: PROGRESSING  2.  Pt will reports she can reach to backseat  of car without pain  Baseline: painful  Goal status: PROGRESSING  3.  Pt will reports she is indepenent in home program for shoulder ROM and strengthening   Baseline: needs review  Goal status: PROGRESSING  4.  Pt will increrase left shoulder abduction to 160 degrees without pain  Baseline: 150 and pain  Goal status:MET  5.  Pt will be able to hold left arm up at 90 degrees of shoulder abduction without pain  Baseline: unable to hold arm up at 90 to test internal rotation due to pain  Goal status: MET   PLAN:  PT FREQUENCY: 2x/week  PT DURATION: 4 weeks  PLANNED INTERVENTIONS: Therapeutic exercises, Therapeutic activity, Neuromuscular re-education, Patient/Family education, Self Care, Joint mobilization, and Manual lymph drainage. Manual technques, iontophoresis, dry needling if pt does not improve with manual work   PLAN FOR NEXT SESSION: do manual work to tight soft tissue at  pecs, upper traps.lats and scapular area.  PROM and stretching to left shoulder, progress home program.    Stark Bray, PT 12/15/2022, 12:16 PM

## 2022-12-17 ENCOUNTER — Other Ambulatory Visit: Payer: Self-pay

## 2022-12-17 ENCOUNTER — Inpatient Hospital Stay: Payer: PPO | Admitting: Adult Health

## 2022-12-17 ENCOUNTER — Encounter: Payer: Self-pay | Admitting: Adult Health

## 2022-12-17 ENCOUNTER — Inpatient Hospital Stay: Payer: PPO | Attending: Hematology and Oncology | Admitting: Adult Health

## 2022-12-17 VITALS — BP 128/88 | HR 66 | Temp 97.9°F | Resp 16 | Ht 65.0 in | Wt 158.7 lb

## 2022-12-17 DIAGNOSIS — Z818 Family history of other mental and behavioral disorders: Secondary | ICD-10-CM | POA: Insufficient documentation

## 2022-12-17 DIAGNOSIS — Z800 Family history of malignant neoplasm of digestive organs: Secondary | ICD-10-CM | POA: Insufficient documentation

## 2022-12-17 DIAGNOSIS — Z170 Estrogen receptor positive status [ER+]: Secondary | ICD-10-CM | POA: Insufficient documentation

## 2022-12-17 DIAGNOSIS — Z79899 Other long term (current) drug therapy: Secondary | ICD-10-CM | POA: Diagnosis not present

## 2022-12-17 DIAGNOSIS — N6325 Unspecified lump in the left breast, overlapping quadrants: Secondary | ICD-10-CM | POA: Insufficient documentation

## 2022-12-17 DIAGNOSIS — Z825 Family history of asthma and other chronic lower respiratory diseases: Secondary | ICD-10-CM | POA: Insufficient documentation

## 2022-12-17 DIAGNOSIS — G4733 Obstructive sleep apnea (adult) (pediatric): Secondary | ICD-10-CM | POA: Diagnosis not present

## 2022-12-17 DIAGNOSIS — E782 Mixed hyperlipidemia: Secondary | ICD-10-CM | POA: Insufficient documentation

## 2022-12-17 DIAGNOSIS — C50512 Malignant neoplasm of lower-outer quadrant of left female breast: Secondary | ICD-10-CM | POA: Diagnosis not present

## 2022-12-17 DIAGNOSIS — G4730 Sleep apnea, unspecified: Secondary | ICD-10-CM | POA: Diagnosis not present

## 2022-12-17 DIAGNOSIS — Z79811 Long term (current) use of aromatase inhibitors: Secondary | ICD-10-CM | POA: Insufficient documentation

## 2022-12-17 DIAGNOSIS — I10 Essential (primary) hypertension: Secondary | ICD-10-CM | POA: Insufficient documentation

## 2022-12-17 DIAGNOSIS — Z823 Family history of stroke: Secondary | ICD-10-CM | POA: Diagnosis not present

## 2022-12-17 DIAGNOSIS — M85851 Other specified disorders of bone density and structure, right thigh: Secondary | ICD-10-CM | POA: Diagnosis not present

## 2022-12-17 DIAGNOSIS — Z8349 Family history of other endocrine, nutritional and metabolic diseases: Secondary | ICD-10-CM | POA: Diagnosis not present

## 2022-12-17 DIAGNOSIS — Z8049 Family history of malignant neoplasm of other genital organs: Secondary | ICD-10-CM | POA: Diagnosis not present

## 2022-12-17 DIAGNOSIS — Z8249 Family history of ischemic heart disease and other diseases of the circulatory system: Secondary | ICD-10-CM | POA: Diagnosis not present

## 2022-12-17 DIAGNOSIS — Z8719 Personal history of other diseases of the digestive system: Secondary | ICD-10-CM | POA: Insufficient documentation

## 2022-12-17 NOTE — Progress Notes (Signed)
Faxed Mammogram order to [place] at [phone] per Wilber Bihari NP. Fax confirmation received.

## 2022-12-17 NOTE — Progress Notes (Signed)
SURVIVORSHIP VISIT:    BRIEF ONCOLOGIC HISTORY:  Oncology History  Malignant neoplasm of lower-outer quadrant of left breast of female, estrogen receptor positive ([place])  06/16/2022 Initial Diagnosis   Screening mammogram detected left breast mass at 3 o'clock position 0.4 cm, axilla negative, ultrasound-guided biopsy revealed grade 1 IDC with lobular features with DCIS and calcifications ER 95% PR 80% HER2 negative, Ki-67 15%   06/16/2022 Cancer Staging   Staging form: Breast, AJCC 8th Edition - Clinical: Stage IA (cT1a, cN0, cM0, G1, ER+, PR+, HER2-) - Signed by Nicholas Lose, MD on 06/16/2022 Histologic grading system: 3 grade system    Genetic Testing   [facility] Multi-Cancer Panel was Negative. Of note, a variant of uncertain significance was detected in the [facility] gene. Report date is 03/16/2019.  The Multi-Cancer Panel offered by [facility] includes sequencing and/or deletion/duplication analysis of the following 84 genes:  AIP, ALK, APC, ATM, AXIN2, BAP1, BARD1, BLM, BMPR1A, BRCA1, BRCA2, BRIP1, CASR, CDC73, CDH1, CDK4, CDKN1B, CDKN1C, CDKN2A, CEBPA, CHEK2, CTNNA1, DICER1, DIS3L2, EGFR, EPCAM, FH, FLCN, GATA2, GPC3, GREM1, HOXB13, HRAS, KIT, MAX, MEN1, MET, MITF, MLH1, MSH2, MSH3, MSH6, MUTYH, NBN, NF1, NF2, NTHL1, PALB2, PDGFRA, PHOX2B, PMS2, POLD1, POLE, POT1, PRKAR1A, PTCH1, PTEN, RAD50, RAD51C, RAD51D, RB1, RECQL4, RET, RUNX1, SDHA, SDHAF2, SDHB, SDHC, SDHD, SMAD4, SMARCA4, SMARCB1, SMARCE1, STK11, SUFU, TERC, TERT, TMEM127, TP53, TSC1, TSC2, VHL, WRN, and WT1.        06/25/2022 Surgery   Left lumpectomy: Grade 2 IDC 1.7 cm, margins negative, 0/5 sentinel nodes negative ER 95%, PR 80%, HER2 negative, Ki-67 15%    06/25/2022 Oncotype testing   Oncotype DX score: 22: 8% risk of distant recurrence   08/17/2022 - 09/14/2022 Radiation Therapy   Site Technique Total Dose (Gy) Dose per Fx (Gy) Completed Fx Beam Energies  Breast, Left: Breast_L 3D 42.56/42.56 2.66 16/16 6XFFF  Breast, Left:  Breast_L_Bst 3D 8/8 2 4/4 6X     11/2022 -  Anti-estrogen oral therapy   Letrozole     INTERVAL HISTORY:  Ms. Rosenkrantz to review her survivorship care plan detailing her treatment course for breast cancer, as well as monitoring long-term side effects of that treatment, education regarding health maintenance, screening, and overall wellness and health promotion.     Overall, Ms. Rodis reports feeling quite well.  She is taking letrozole to be with good tolerance.  She has some mild twinges in her left breast from time to time and mild swelling but otherwise she says she is feeling well.  REVIEW OF SYSTEMS:  Review of Systems  Constitutional:  Negative for appetite change, chills, fatigue, fever and unexpected weight change.  HENT:   Negative for hearing loss, lump/mass and trouble swallowing.   Eyes:  Negative for eye problems and icterus.  Respiratory:  Negative for chest tightness, cough and shortness of breath.   Cardiovascular:  Negative for chest pain, leg swelling and palpitations.  Gastrointestinal:  Negative for abdominal distention, abdominal pain, constipation, diarrhea, nausea and vomiting.  Endocrine: Negative for hot flashes.  Genitourinary:  Negative for difficulty urinating.   Musculoskeletal:  Negative for arthralgias.  Skin:  Negative for itching and rash.  Neurological:  Negative for dizziness, extremity weakness, headaches and numbness.  Hematological:  Negative for adenopathy. Does not bruise/bleed easily.  Psychiatric/Behavioral:  Negative for depression. The patient is not nervous/anxious.    Breast: Denies any new nodularity, masses, tenderness, nipple changes, or nipple discharge.    PAST MEDICAL/SURGICAL HISTORY:  Past Medical History:  Diagnosis Date  Anxiety    Colon polyp    GERD (gastroesophageal reflux disease)    HTN (hypertension), benign 09/05/2011   Hyperlipidemia, mixed 09/05/2011   Leukopenia 09/05/2011   Sleep apnea    Thalamic infarction  ([place]) 12/19/2013   no defecits   Past Surgical History:  Procedure Laterality Date   BREAST LUMPECTOMY WITH RADIOACTIVE SEED AND SENTINEL LYMPH NODE BIOPSY Left 06/25/2022   Procedure: LEFT BREAST LUMPECTOMY WITH RADIOACTIVE SEED AND SENTINEL LYMPH NODE BIOPSY;  Surgeon: Autumn Messing III, MD;  Location: [place];  Service: General;  Laterality: Left;   KNEE ARTHROSCOPY Right 2005 approx   torn meniscus   TUBAL LIGATION     twice (first failed)   uterine ablation       ALLERGIES:  No Known Allergies   CURRENT MEDICATIONS:  Outpatient Encounter Medications as of 12/17/2022  Medication Sig Note   acetaminophen (TYLENOL) 500 MG tablet Take 500 mg by mouth every 6 (six) hours as needed.    aspirin EC 81 MG tablet Take 81 mg by mouth daily. 06/21/2022: Did not take today   atenolol (TENORMIN) 25 MG tablet TAKE 1 TABLET BY MOUTH TWICE A DAY    atorvastatin (LIPITOR) 40 MG tablet Take 1 tablet (40 mg total) by mouth daily.    buPROPion (WELLBUTRIN XL) 150 MG 24 hr tablet Take 450 mg by mouth every morning. 05/20/2022: Pt takes 2-3 daily   Cholecalciferol (VITAMIN D-3) 125 MCG (5000 UT) TABS Take 1 tablet by mouth 4 (four) times a week. 06/21/2022: Stopped for surgery   cyanocobalamin 100 MCG tablet Take 1,000 mcg by mouth daily. 06/21/2022: Stopped for surgery   gabapentin (NEURONTIN) 100 MG capsule Take 100 mg by mouth at bedtime.    letrozole (FEMARA) 2.5 MG tablet Take 1 tablet (2.5 mg total) by mouth daily.    losartan (COZAAR) 50 MG tablet TAKE 1 TABLET BY MOUTH EVERY DAY    VITAMIN B COMPLEX-C PO  06/21/2022: Stopped for surgery   No facility-administered encounter medications on file as of 12/17/2022.     ONCOLOGIC FAMILY HISTORY:  Family History  Problem Relation Age of Onset   Hypertension Father    Hyperlipidemia Father    Stroke Father        multiple and TIA's   Sleep apnea Father    Hypertension Mother    Uterine cancer Mother    Colon cancer Mother 31    Bipolar disorder Daughter        schizo affective   Uterine cancer Sister    Hyperlipidemia Sister    Acute myelogenous leukemia Brother        s/p stem cell transplant (in 54's)   Endometriosis Sister    Hyperlipidemia Sister      SOCIAL HISTORY:  Social History   Socioeconomic History   Marital status: Married    Spouse name: Bruce   Number of children: 3   Years of education: BS   Highest education level: Not on file  Occupational History    Employer: [employer]    Comment: [employer]   Tobacco Use   Smoking status: Never   Smokeless tobacco: Never  Vaping Use   Vaping Use: Never used  Substance and Sexual Activity   Alcohol use: Yes    Alcohol/week: 1.0 standard drink of alcohol    Types: 1 Glasses of wine per week    Comment: 1   Drug use: No   Sexual activity: Not Currently  Birth control/protection: Surgical    Comment: husband with prostate cancer  Other Topics Concern   Not on file  Social History Narrative   Patient is right handed and consumes 2 caffeinated beverages daily   Married, no pets.   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      Update 05/2022   Social Determinants of Health   Financial Resource Strain: Low Risk  (06/16/2022)   Overall Financial Resource Strain (CARDIA)    Difficulty of Paying Living Expenses: Not hard at all  Food Insecurity: No Food Insecurity (06/16/2022)   Hunger Vital Sign    Worried About Running Out of Food in the Last Year: Never true    Ran Out of Food in the Last Year: Never true  Transportation Needs: No Transportation Needs (06/16/2022)   PRAPARE - Hydrologist (Medical): No    Lack of Transportation (Non-Medical): No  Physical Activity: Not on file  Stress: Not on file  Social Connections: Not on file  Intimate  Partner Violence: Not on file     OBSERVATIONS/OBJECTIVE:  BP 128/88 (BP Location: Left Arm, Patient Position: Sitting)   Pulse 66   Temp 97.9 F (36.6 C) (Temporal)   Resp 16   Ht '5\' 5"'$  (1.651 m)   Wt 158 lb 11.2 oz (72 kg)   SpO2 99%   BMI 26.41 kg/m  GENERAL: Patient is a well appearing female in no acute distress HEENT:  Sclerae anicteric.  Oropharynx clear and moist. No ulcerations or evidence of oropharyngeal candidiasis. Neck is supple.  NODES:  No cervical, supraclavicular, or axillary lymphadenopathy palpated.  BREAST EXAM: Breast status postlumpectomy and radiation no sign of local recurrence right breast is benign. LUNGS:  Clear to auscultation bilaterally.  No wheezes or rhonchi. HEART:  Regular rate and rhythm. No murmur appreciated. ABDOMEN:  Soft, nontender.  Positive, normoactive bowel sounds. No organomegaly palpated. MSK:  No focal spinal tenderness to palpation. Full range of motion bilaterally in the upper extremities. EXTREMITIES:  No peripheral edema.   SKIN:  Clear with no obvious rashes or skin changes. No nail dyscrasia. NEURO:  Nonfocal. Well oriented.  Appropriate affect.  LABORATORY DATA:  None for this visit.  DIAGNOSTIC IMAGING:  None for this visit.      ASSESSMENT AND PLAN:  Ms.. Stiverson is a pleasant 67 y.o. female with Stage IA left breast invasive ductal carcinoma, ER+/PR+/HER2-, diagnosed in 06/2022, treated with lumpectomy, adjuvant radiation therapy, and anti-estrogen therapy with Letrozole beginning in 11/2022.  She presents to the Survivorship Clinic for our initial meeting and routine follow-up post-completion of treatment for breast cancer.    1. Stage IA left breast cancer:  Ms. Mesnard is continuing to recover from definitive treatment for breast cancer. She will follow-up with her medical oncologist, Dr. Lindi Adie in 6 months with history and physical exam per surveillance protocol.  She will continue her anti-estrogen therapy with  Letrozole. Thus far, she is tolerating the Letrozole well, with minimal side effects. She was instructed to make Dr. Lindi Adie or myself aware if she begins to experience any worsening side effects of the medication and I could see her back in clinic to help manage those side effects, as needed. Her mammogram is due 05/2023; orders placed today. Today, a comprehensive survivorship care plan and  treatment summary was reviewed with the patient today detailing her breast cancer diagnosis, treatment course, potential late/long-term effects of treatment, appropriate follow-up care with recommendations for the future, and patient education resources.  A copy of this summary, along with a letter will be sent to the patient's primary care provider via mail/fax/In Basket message after today's visit.    2. Bone health:  Given Ms. Shearn's age/history of breast cancer and her current treatment regimen including anti-estrogen therapy with letrozole, she is at risk for bone demineralization.  Her last DEXA scan was July 19, 2022 demonstrating a T-score of -1.5 in the right femur consistent with osteopenia.  Repeat is recommended to occur in October 2025. She was given education on specific activities to promote bone health.  3. Cancer screening:  Due to Ms. Stan's history and her age, she should receive screening for skin cancers, colon cancer, and gynecologic cancers.  The information and recommendations are listed on the patient's comprehensive care plan/treatment summary and were reviewed in detail with the patient.    4. Health maintenance and wellness promotion: Ms. Dimmick was encouraged to consume 5-7 servings of fruits and vegetables per day. We reviewed the "Nutrition Rainbow" handout.  She was also encouraged to engage in moderate to vigorous exercise for 30 minutes per day most days of the week.  She was instructed to limit her alcohol consumption and continue to abstain from tobacco use.     5. Support  services/counseling: It is not uncommon for this period of the patient's cancer care trajectory to be one of many emotions and stressors. She was given information regarding our available services and encouraged to contact me with any questions or for help enrolling in any of our support group/programs.    Follow up instructions:    -Return to cancer center 6 months for f/u with Dr. Lindi Adie  -Mammogram due in 05/2023 -Bone density testing in 07/2024 -She is welcome to return back to the Survivorship Clinic at any time; no additional follow-up needed at this time.  -Consider referral back to survivorship as a long-term survivor for continued surveillance  The patient was provided an opportunity to ask questions and all were answered. The patient agreed with the plan and demonstrated an understanding of the instructions.   Total encounter time:40 minutes*in face-to-face visit time, chart review, lab review, care coordination, order entry, and documentation of the encounter time.    Wilber Bihari, NP 12/17/22 2:30 PM Medical Oncology and Hematology [facility],  [address] Tel. [phone]    Fax. [phone]  *Total Encounter Time as defined by the Centers for Medicare and Medicaid Services includes, in addition to the face-to-face time of a patient visit (documented in the note above) non-face-to-face time: obtaining and reviewing outside history, ordering and reviewing medications, tests or procedures, care coordination (communications with other health care professionals or caregivers) and documentation in the medical record.

## 2022-12-20 ENCOUNTER — Ambulatory Visit: Payer: PPO | Admitting: Rehabilitation

## 2022-12-20 ENCOUNTER — Encounter: Payer: Self-pay | Admitting: Rehabilitation

## 2022-12-20 DIAGNOSIS — M25612 Stiffness of left shoulder, not elsewhere classified: Secondary | ICD-10-CM

## 2022-12-20 DIAGNOSIS — M25512 Pain in left shoulder: Secondary | ICD-10-CM

## 2022-12-20 DIAGNOSIS — R293 Abnormal posture: Secondary | ICD-10-CM

## 2022-12-20 DIAGNOSIS — Z170 Estrogen receptor positive status [ER+]: Secondary | ICD-10-CM

## 2022-12-20 DIAGNOSIS — Z483 Aftercare following surgery for neoplasm: Secondary | ICD-10-CM

## 2022-12-20 NOTE — Therapy (Signed)
OUTPATIENT PHYSICAL THERAPY ONCOLOGY TREATMENT  Patient Name: Carol Harris MRN: [id number] DOB:12/15/1955, 67 y.o., female Today's Date: 12/20/2022  END OF SESSION:  PT End of Session - 12/20/22 1400     Visit Number 10    Number of Visits 17    Date for PT Re-Evaluation 01/12/23    PT Start Time [id number]    PT Stop Time [id number]    PT Time Calculation (min) 45 min    Activity Tolerance Patient tolerated treatment well    Behavior During Therapy [facility] for tasks assessed/performed              Past Medical History:  Diagnosis Date   Anxiety    Colon polyp    GERD (gastroesophageal reflux disease)    HTN (hypertension), benign 09/05/2011   Hyperlipidemia, mixed 09/05/2011   Leukopenia 09/05/2011   Sleep apnea    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[place]) 12/19/2013   no defecits   Past Surgical History:  Procedure Laterality Date   BREAST LUMPECTOMY WITH RADIOACTIVE SEED AND SENTINEL LYMPH NODE BIOPSY Left 06/25/2022   Procedure: LEFT BREAST LUMPECTOMY WITH RADIOACTIVE SEED AND SENTINEL LYMPH NODE BIOPSY;  Surgeon: Jovita Kussmaul, MD;  Location: [place];  Service: General;  Laterality: Left;   KNEE ARTHROSCOPY Right 2005 approx   torn meniscus   TUBAL LIGATION     twice (first failed)   uterine ablation     Patient Active Problem List   Diagnosis Date Noted   Malignant neoplasm of lower-outer quadrant of left breast of female, estrogen receptor positive ([place]) 06/16/2022   Genetic testing 06/16/2022   Restless leg syndrome 11/08/2018   Left ventricular hypertrophy 11/08/2018   Depression 11/03/2018   Family history of colon cancer in mother 11/03/2018   History of vitamin D deficiency 11/03/2018   Night terrors, adult 11/03/2018   History of CVA (cerebrovascular accident) 07/12/2014   Obstructive sleep apnea 01/08/2014   GERD (gastroesophageal reflux disease) 12/19/2013   HTN (hypertension), benign 09/05/2011   Hyperlipidemia, mixed 09/05/2011    PCP: Rita Ohara, MD  REFERRING PROVIDER: Nicholas Lose, MD  REFERRING DIAG: C50.512,Z17.0 (ICD-10-CM) - Malignant neoplasm of lower-outer quadrant of left breast of female, estrogen receptor positive ([place])  THERAPY DIAG:  Stiffness of left shoulder, not elsewhere classified  Left shoulder pain, unspecified chronicity  Abnormal posture  Malignant neoplasm of lower-outer quadrant of left breast of female, estrogen receptor positive ([place])  Aftercare following surgery for neoplasm  ONSET DATE: 06/25/2022  Rationale for Evaluation and Treatment: Rehabilitation  SUBJECTIVE:  SUBJECTIVE STATEMENT:   My shoulder did well over the weekend. I did try the red band it was too much  EVAL: pt states she can/t reach reach around to the back seat. And she can't do the exercises she has always done with her arm. She still has tingling and pain  in her upper arm.  She sometimes has to take a tylenol at night. She had been doing yoga   PERTINENT HISTORY: 06/25/2022: Left lumpectomy: Grade 2 IDC 1.7 cm, margins negative, 0/5 sentinel nodes negative ER 95%, PR 80%, HER2 negative, Ki-67 15%  Adjuvant radiation completed 09/14/2022 No chemo, but is on letrozole, pt did not tolerate anastrozole    PAIN:  Are you having pain? No NPRS scale: 0/10 at rest  PRECAUTIONS: at risk for lymphedemaa   WEIGHT BEARING RESTRICTIONS: No  FALLS:  Has patient fallen in last 6 months? No  LIVING ENVIRONMENT: Lives with: liives with spouse, no pets  Lives in: House/apartment Stairs: Yes; up to husband's study  Has following equipment at home: None  OCCUPATION: retired   Psychologist, clinical: travel, exercise , spend time with family   PATIENT GOALS: to get rid of shoulder pain and get back to exercising and get movement and strength back     OBJECTIVE: PALPATION:  EVAL: Firmness and pain in pec major with pinpoint tenderness at corocoid process at pec minor insertions. She also has palpable tightness in left upper trap, lat and scapular muscles.   OBSERVATIONS / OTHER ASSESSMENTS:  EVAL: scapular asymmetry in bilateral shoulder elevation .Pt has discoloration in left axilla from radiation and still has some bruising and fullness in left breast medical to nipple and scar.  She is wearing a light compression bra that she got from [employer].   UPPER EXTREMITY AROM/PROM: A/PROM RIGHT   eval   Shoulder extension 55  Shoulder flexion 165  Shoulder abduction 180  Shoulder internal rotation 40  Shoulder external rotation 90    (Blank rows = not tested)  A/PROM LEFT   eval 12/15/22  Shoulder extension 55   Shoulder flexion 140 150  Shoulder abduction 150 160  Shoulder internal rotation 20* unable to hold arm up  Behind the back normal  Shoulder external rotation 80 80    (Blank rows = not tested)  UPPER EXTREMITY STRENGTH:  4+/5 without pain bil  LLYMPHEDEMA ASSESSMENTS:  SURGERY TYPE/DATE: left lumpectomy 06/25/2022 0/5 nodes  NUMBER OF LYMPH NODES REMOVED: 5 CHEMOTHERAPY: no RADIATION:yes  HORMONE TREATMENT: yes INFECTIONS: no  LYMPHEDEMA ASSESSMENTS:   LANDMARK RIGHT  eval  10 cm proximal to olecranon process 28.5  Olecranon process 26  10 cm proximal to ulnar styloid process 19.5  Just proximal to ulnar styloid process 16  Across hand at thumb web space 19.5  At base of 2nd digit 6.2  (Blank rows = not tested)  LANDMARK LEFT  eval  10 cm proximal to olecranon process 28  Olecranon process 25.5  10 cm proximal to ulnar styloid process 19.5  Just proximal to ulnar styloid process 15.5  Across hand at thumb web space `9.5  At base of 2nd digit 6  (Blank rows = not tested)   TODAY'S TREATMENT:  12/20/22 Pulleys flexion x 51mn and abduction x 250m Yellow band row 2x 15 Bil extension yellow  2x 15 Lt ER yellow x10 and then x 8  Yellow flexion with intial cueing 2x5 with cueing to keep shoulder blades relax Small red ball circles on the wall x  10 c/cc and up/down, side to side  PROM to tolerance all directions still needing vcs to relax and some pinch with abduction when not relaxed  Joint mob: [place] AP and inferior glides grade IV- 4x30" each  12/13/22 Rechecked AROM and goals x 38mn Pulleys flexion x 244m and abduction x 7m73m(able to do this today) Yellow band row 2x 10 Bil extension yellow 2x 10 Bil ER yellow 2x 10 Yellow band horizontal abd with pinch today Supine Diagonal 2x5 bil with initial cueing  Small amplitude alphabet with 1# Lt only PROM to tolerance all directions still needing vcs to relax and some pinch with abduction when not relaxed  Joint mob: GH [place] and inferior glides grade IV- 4x30" each  12/09/22 Pulleys flexion x 7mi41mnd scaption: stopped due to trouble relaxing Yellow band row 2x 10 Bil extension yellow 2x 10 Bil ER yellow 2x 10 Yellow band horizontal abd STM with cocoa butter to Lt UT, latissimus, pectoralis, upper medial arm, axillary borders in supine with any Trigger point release as needed PROM to tolerance all directions still needing vcs to relax and some pinch with abduction when not relaxed  Joint mob: GH ANew Berlinand inferior glides grade IV- 4x30" each       12/07/22 Pulleys flexion x 7min59md scaption: stopped due to trouble relaxing Yellow band row 2x 10 Bil extension yellow 2x 10 Bil ER yellow 2x 10 Flexion 1# bil with mirror to watch shoulder movement  Push up plus x 10  STM with cocoa butter to Lt UT, latissimus, pectoralis, upper medial arm, axillary borders in supine with any Trigger point release as needed and then in sidelying to the latissimus, rhomboids PROM to tolerance all directions Joint mob: GH APColemannd inferior glides grade IV- 4x30" each  PATIENT EDUCATION:  Education details: meeks decompression and supine dowel shoulder  flexion  Person educated: Patient Education method: ExplaConsulting civil engineeronMedia planner Handouts Education comprehension: verbalized understanding and returned demonstration  HOME EXERCISE PROGRAM: Access Code: [email or web address] URL: [email or web address] Date: 12/20/2022 Prepared by: Mikisha Roseland Shan Levansrcises - Wall Push Up with Plus  - 1 x daily - 7 x weekly - 1 sets - 10 reps - no hold - Standing Row with Anchored Resistance Band with PLB  - 1 x daily - 2-3 x weekly - 1-3 sets - 10 reps - no hold - Shoulder extension with resistance - Neutral  - 1 x daily - 2-3 x weekly - 1-3 sets - 10 reps - no hold - Standing Shoulder External Rotation with Resistance  - 1 x daily - 2-3 x weekly - 1-3 sets - 10 reps - no hold - Standing Shoulder Single Arm Flexion with Anchored Resistance  - 1 x daily - 2-3 x weekly - 1-3 sets - 10 reps - no hold - Standing Wall Ball [employer] Mini Swiss Ball  - 1 x daily - 2-3 x weekly - 1-3 sets - 10 reps - no hold - Standing Pec Stretch at Wall  - 1 x daily - 7 x weekly - 1-3 sets - 10 reps - 20-30 seconds hold  ASSESSMENT: CLINICAL IMPRESSION:  Added band flexion and wall ball work on the wall with pt noting a slight increase in pain.  Discussed only doing band TE 2x per week.     OBJECTIVE IMPAIRMENTS: decreased activity tolerance, decreased coordination, decreased endurance, decreased knowledge of condition, decreased knowledge of use of DME, decreased ROM, decreased strength, increased fascial restrictions, impaired perceived functional  ability, increased muscle spasms, impaired UE functional use, postural dysfunction, and pain.   ACTIVITY LIMITATIONS: reaching behind her and reaching overhead   PARTICIPATION LIMITATIONS: possibly family obligations   PERSONAL FACTORS: recent surgery, radiation, on hormone blocker are also affecting patient's functional outcome.   REHAB POTENTIAL: Good  CLINICAL DECISION MAKING: Stable/uncomplicated  EVALUATION  COMPLEXITY: Low  GOALS: Goals reviewed with patient? Yes  LONG  TERM GOALS: Target date: 12/20/2022  Pt will decrease QuickDASH to < 10 indicating improvement in left UE function  Baseline:36.35, 15% Goal status: PROGRESSING  2.  Pt will reports she can reach to backseat of car without pain  Baseline: painful  Goal status: PROGRESSING  3.  Pt will reports she is indepenent in home program for shoulder ROM and strengthening  Baseline: needs review  Goal status: PROGRESSING  4.  Pt will increrase left shoulder abduction to 160 degrees without pain  Baseline: 150 and pain  Goal status:MET  5.  Pt will be able to hold left arm up at 90 degrees of shoulder abduction without pain  Baseline: unable to hold arm up at 90 to test internal rotation due to pain  Goal status: MET   PLAN:  PT FREQUENCY: 2x/week  PT DURATION: 4 weeks  PLANNED INTERVENTIONS: Therapeutic exercises, Therapeutic activity, Neuromuscular re-education, Patient/Family education, Self Care, Joint mobilization, and Manual lymph drainage. Manual technques, iontophoresis, dry needling if pt does not improve with manual work   PLAN FOR NEXT SESSION: do manual work to tight soft tissue at  pecs, upper traps.lats and scapular area.  PROM and stretching to left shoulder, progress home program.    Stark Bray, PT 12/20/2022, 2:52 PM

## 2022-12-22 ENCOUNTER — Encounter: Payer: PPO | Admitting: Rehabilitation

## 2022-12-27 ENCOUNTER — Encounter: Payer: Self-pay | Admitting: Rehabilitation

## 2022-12-27 ENCOUNTER — Ambulatory Visit: Payer: PPO | Admitting: Rehabilitation

## 2022-12-27 DIAGNOSIS — Z170 Estrogen receptor positive status [ER+]: Secondary | ICD-10-CM

## 2022-12-27 DIAGNOSIS — Z483 Aftercare following surgery for neoplasm: Secondary | ICD-10-CM

## 2022-12-27 DIAGNOSIS — M25612 Stiffness of left shoulder, not elsewhere classified: Secondary | ICD-10-CM | POA: Diagnosis not present

## 2022-12-27 DIAGNOSIS — M25512 Pain in left shoulder: Secondary | ICD-10-CM

## 2022-12-27 DIAGNOSIS — R293 Abnormal posture: Secondary | ICD-10-CM

## 2022-12-27 NOTE — Therapy (Signed)
OUTPATIENT PHYSICAL THERAPY ONCOLOGY TREATMENT  Patient Name: Carol Harris MRN: [id number] DOB:04-02-1956, 67 y.o., female Today's Date: 12/27/2022  END OF SESSION:  PT End of Session - 12/27/22 0957     Visit Number 11    Number of Visits 17    PT Start Time 1000    PT Stop Time [id number]    PT Time Calculation (min) 42 min    Activity Tolerance Patient tolerated treatment well    Behavior During Therapy WFL for tasks assessed/performed              Past Medical History:  Diagnosis Date   Anxiety    Colon polyp    GERD (gastroesophageal reflux disease)    HTN (hypertension), benign 09/05/2011   Hyperlipidemia, mixed 09/05/2011   Leukopenia 09/05/2011   Sleep apnea    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   no defecits   Past Surgical History:  Procedure Laterality Date   BREAST LUMPECTOMY WITH RADIOACTIVE SEED AND SENTINEL LYMPH NODE BIOPSY Left 06/25/2022   Procedure: LEFT BREAST LUMPECTOMY WITH RADIOACTIVE SEED AND SENTINEL LYMPH NODE BIOPSY;  Surgeon: Jovita Kussmaul, MD;  Location: [facility];  Service: General;  Laterality: Left;   KNEE ARTHROSCOPY Right 2005 approx   torn meniscus   TUBAL LIGATION     twice (first failed)   uterine ablation     Patient Active Problem List   Diagnosis Date Noted   Malignant neoplasm of lower-outer quadrant of left breast of female, estrogen receptor positive ([place]) 06/16/2022   Genetic testing 06/16/2022   Restless leg syndrome 11/08/2018   Left ventricular hypertrophy 11/08/2018   Depression 11/03/2018   Family history of colon cancer in mother 11/03/2018   History of vitamin D deficiency 11/03/2018   Night terrors, adult 11/03/2018   History of CVA (cerebrovascular accident) 07/12/2014   Obstructive sleep apnea 01/08/2014   GERD (gastroesophageal reflux disease) 12/19/2013   HTN (hypertension), benign 09/05/2011   Hyperlipidemia, mixed 09/05/2011    PCP: Rita Ohara, MD  REFERRING PROVIDER: Nicholas Lose, MD  REFERRING DIAG: C50.512,Z17.0 (ICD-10-CM) - Malignant neoplasm of lower-outer quadrant of left breast of female, estrogen receptor positive ([place])  THERAPY DIAG:  Stiffness of left shoulder, not elsewhere classified  Left shoulder pain, unspecified chronicity  Abnormal posture  Malignant neoplasm of lower-outer quadrant of left breast of female, estrogen receptor positive ([place])  Aftercare following surgery for neoplasm  ONSET DATE: 06/25/2022  Rationale for Evaluation and Treatment: Rehabilitation  SUBJECTIVE:  SUBJECTIVE STATEMENT:   I fell over the weekend and my arm is okay but I bruised my left ribs/anterior hip   EVAL: pt states she can/t reach reach around to the back seat. And she can't do the exercises she has always done with her arm. She still has tingling and pain  in her upper arm.  She sometimes has to take a tylenol at night. She had been doing yoga   PERTINENT HISTORY: 06/25/2022: Left lumpectomy: Grade 2 IDC 1.7 cm, margins negative, 0/5 sentinel nodes negative ER 95%, PR 80%, HER2 negative, Ki-67 15%  Adjuvant radiation completed 09/14/2022 No chemo, but is on letrozole, pt did not tolerate anastrozole    PAIN:  Are you having pain? No NPRS scale: 0/10 at rest  PRECAUTIONS: at risk for lymphedemaa   WEIGHT BEARING RESTRICTIONS: No  FALLS:  Has patient fallen in last 6 months? No  LIVING ENVIRONMENT: Lives with: liives with spouse, no pets  Lives in: House/apartment Stairs: Yes; up to husband's study  Has following equipment at home: None  OCCUPATION: retired   Psychologist, clinical: travel, exercise , spend time with family   PATIENT GOALS: to get rid of shoulder pain and get back to exercising and get movement and strength back    OBJECTIVE: PALPATION:  EVAL: Firmness  and pain in pec major with pinpoint tenderness at corocoid process at pec minor insertions. She also has palpable tightness in left upper trap, lat and scapular muscles.   OBSERVATIONS / OTHER ASSESSMENTS:  EVAL: scapular asymmetry in bilateral shoulder elevation .Pt has discoloration in left axilla from radiation and still has some bruising and fullness in left breast medical to nipple and scar.  She is wearing a light compression bra that she got from [employer].   UPPER EXTREMITY AROM/PROM: A/PROM RIGHT   eval   Shoulder extension 55  Shoulder flexion 165  Shoulder abduction 180  Shoulder internal rotation 40  Shoulder external rotation 90    (Blank rows = not tested)  A/PROM LEFT   eval 12/15/22  Shoulder extension 55   Shoulder flexion 140 150  Shoulder abduction 150 160  Shoulder internal rotation 20* unable to hold arm up  Behind the back normal  Shoulder external rotation 80 80    (Blank rows = not tested)  UPPER EXTREMITY STRENGTH:  4+/5 without pain bil  LLYMPHEDEMA ASSESSMENTS:  SURGERY TYPE/DATE: left lumpectomy 06/25/2022 0/5 nodes  NUMBER OF LYMPH NODES REMOVED: 5 CHEMOTHERAPY: no RADIATION:yes  HORMONE TREATMENT: yes INFECTIONS: no  LYMPHEDEMA ASSESSMENTS:   LANDMARK RIGHT  eval  10 cm proximal to olecranon process 28.5  Olecranon process 26  10 cm proximal to ulnar styloid process 19.5  Just proximal to ulnar styloid process 16  Across hand at thumb web space 19.5  At base of 2nd digit 6.2  (Blank rows = not tested)  LANDMARK LEFT  eval  10 cm proximal to olecranon process 28  Olecranon process 25.5  10 cm proximal to ulnar styloid process 19.5  Just proximal to ulnar styloid process 15.5  Across hand at thumb web space `9.5  At base of 2nd digit 6  (Blank rows = not tested)   TODAY'S TREATMENT:  12/27/22 Pulleys flexion x 67min and abduction x 44min Yellow band row 2x 15 Bil extension yellow 2x 15 bil ER yellow 2x12 2# flexion with  2x10 Small red ball circles on the wall x 10 c/cc and up/down, side to side  PROM to tolerance  Joint  mob: [place] AP and inferior glides grade IV- 4x30" each  12/20/22 Pulleys flexion x 75min and abduction x 54min Yellow band row 2x 15 Bil extension yellow 2x 15 Lt ER yellow x10 and then x 8  Yellow flexion with intial cueing 2x5 with cueing to keep shoulder blades relax Small red ball circles on the wall x 10 c/cc and up/down, side to side  PROM to tolerance all directions still needing vcs to relax and some pinch with abduction when not relaxed  Joint mob: [place] AP and inferior glides grade IV- 4x30" each  12/13/22 Rechecked AROM and goals x 69min Pulleys flexion x 2min and abduction x 50min (able to do this today) Yellow band row 2x 10 Bil extension yellow 2x 10 Bil ER yellow 2x 10 Yellow band horizontal abd with pinch today Supine Diagonal 2x5 bil with initial cueing  Small amplitude alphabet with 1# Lt only PROM to tolerance all directions still needing vcs to relax and some pinch with abduction when not relaxed  Joint mob: [place] AP and inferior glides grade IV- 4x30" each  12/09/22 Pulleys flexion x 108min and scaption: stopped due to trouble relaxing Yellow band row 2x 10 Bil extension yellow 2x 10 Bil ER yellow 2x 10 Yellow band horizontal abd STM with cocoa butter to Lt UT, latissimus, pectoralis, upper medial arm, axillary borders in supine with any Trigger point release as needed PROM to tolerance all directions still needing vcs to relax and some pinch with abduction when not relaxed  Joint mob: [place] AP and inferior glides grade IV- 4x30" each       12/07/22 Pulleys flexion x 73min and scaption: stopped due to trouble relaxing Yellow band row 2x 10 Bil extension yellow 2x 10 Bil ER yellow 2x 10 Flexion 1# bil with mirror to watch shoulder movement  Push up plus x 10  STM with cocoa butter to Lt UT, latissimus, pectoralis, upper medial arm, axillary borders in supine with any Trigger  point release as needed and then in sidelying to the latissimus, rhomboids PROM to tolerance all directions Joint mob: [place] AP and inferior glides grade IV- 4x30" each  PATIENT EDUCATION:  Education details: meeks decompression and supine dowel shoulder flexion  Person educated: Patient Education method: Consulting civil engineer, Media planner, and Handouts Education comprehension: verbalized understanding and returned demonstration  HOME EXERCISE PROGRAM: Access Code: [email or web address] URL: [email or web address] Date: 12/20/2022 Prepared by: Shan Levans  Exercises - Wall Push Up with Plus  - 1 x daily - 7 x weekly - 1 sets - 10 reps - no hold - Standing Row with Anchored Resistance Band with PLB  - 1 x daily - 2-3 x weekly - 1-3 sets - 10 reps - no hold - Shoulder extension with resistance - Neutral  - 1 x daily - 2-3 x weekly - 1-3 sets - 10 reps - no hold - Standing Shoulder External Rotation with Resistance  - 1 x daily - 2-3 x weekly - 1-3 sets - 10 reps - no hold - Standing Shoulder Single Arm Flexion with Anchored Resistance  - 1 x daily - 2-3 x weekly - 1-3 sets - 10 reps - no hold - Standing Wall [employer] with Mini Swiss Ball  - 1 x daily - 2-3 x weekly - 1-3 sets - 10 reps - no hold - Standing Pec Stretch at Wall  - 1 x daily - 7 x weekly - 1-3 sets - 10 reps - 20-30 seconds hold  ASSESSMENT: CLINICAL IMPRESSION:  Pt is doing very well.  She needed almost no MT today due to improved PROM and no guarding.  We will do 1x per week x 2 more weeks and then hopeful for Dc with HEP.   OBJECTIVE IMPAIRMENTS: decreased activity tolerance, decreased coordination, decreased endurance, decreased knowledge of condition, decreased knowledge of use of DME, decreased ROM, decreased strength, increased fascial restrictions, impaired perceived functional ability, increased muscle spasms, impaired UE functional use, postural dysfunction, and pain.   ACTIVITY LIMITATIONS: reaching behind her and  reaching overhead   PARTICIPATION LIMITATIONS: possibly family obligations   PERSONAL FACTORS: recent surgery, radiation, on hormone blocker are also affecting patient's functional outcome.   REHAB POTENTIAL: Good  CLINICAL DECISION MAKING: Stable/uncomplicated  EVALUATION COMPLEXITY: Low  GOALS: Goals reviewed with patient? Yes  LONG  TERM GOALS: Target date: 12/20/2022  Pt will decrease QuickDASH to < 10 indicating improvement in left UE function  Baseline:36.35, 15% Goal status: PROGRESSING  2.  Pt will reports she can reach to backseat of car without pain  Baseline: painful  Goal status: PROGRESSING  3.  Pt will reports she is indepenent in home program for shoulder ROM and strengthening  Baseline: needs review  Goal status: PROGRESSING  4.  Pt will increrase left shoulder abduction to 160 degrees without pain  Baseline: 150 and pain  Goal status:MET  5.  Pt will be able to hold left arm up at 90 degrees of shoulder abduction without pain  Baseline: unable to hold arm up at 90 to test internal rotation due to pain  Goal status: MET   PLAN:  PT FREQUENCY: 2x/week  PT DURATION: 4 weeks  PLANNED INTERVENTIONS: Therapeutic exercises, Therapeutic activity, Neuromuscular re-education, Patient/Family education, Self Care, Joint mobilization, and Manual lymph drainage. Manual technques, iontophoresis, dry needling if pt does not improve with manual work   PLAN FOR NEXT SESSION: do manual work to tight soft tissue at  pecs, upper traps.lats and scapular area.  PROM and stretching to left shoulder, progress home program.    Stark Bray, PT 12/27/2022, 10:42 AM

## 2022-12-28 DIAGNOSIS — F332 Major depressive disorder, recurrent severe without psychotic features: Secondary | ICD-10-CM | POA: Diagnosis not present

## 2022-12-28 DIAGNOSIS — F900 Attention-deficit hyperactivity disorder, predominantly inattentive type: Secondary | ICD-10-CM | POA: Diagnosis not present

## 2022-12-28 DIAGNOSIS — F411 Generalized anxiety disorder: Secondary | ICD-10-CM | POA: Diagnosis not present

## 2023-01-05 ENCOUNTER — Ambulatory Visit: Payer: PPO | Admitting: Rehabilitation

## 2023-01-05 ENCOUNTER — Encounter: Payer: Self-pay | Admitting: Rehabilitation

## 2023-01-05 DIAGNOSIS — R293 Abnormal posture: Secondary | ICD-10-CM

## 2023-01-05 DIAGNOSIS — M25612 Stiffness of left shoulder, not elsewhere classified: Secondary | ICD-10-CM | POA: Diagnosis not present

## 2023-01-05 DIAGNOSIS — Z170 Estrogen receptor positive status [ER+]: Secondary | ICD-10-CM

## 2023-01-05 DIAGNOSIS — Z483 Aftercare following surgery for neoplasm: Secondary | ICD-10-CM

## 2023-01-05 DIAGNOSIS — M25512 Pain in left shoulder: Secondary | ICD-10-CM

## 2023-01-05 DIAGNOSIS — Z9189 Other specified personal risk factors, not elsewhere classified: Secondary | ICD-10-CM

## 2023-01-05 DIAGNOSIS — M6281 Muscle weakness (generalized): Secondary | ICD-10-CM

## 2023-01-05 DIAGNOSIS — C50512 Malignant neoplasm of lower-outer quadrant of left female breast: Secondary | ICD-10-CM

## 2023-01-05 NOTE — Therapy (Signed)
OUTPATIENT PHYSICAL THERAPY ONCOLOGY TREATMENT  Patient Name: Carol Harris MRN: [id number] DOB:1955-11-05, 67 y.o., female Today's Date: 01/05/2023  END OF SESSION:  PT End of Session - 01/05/23 0959     Visit Number 12    Number of Visits 17    Date for PT Re-Evaluation 01/12/23    PT Start Time 1000    PT Stop Time 1053    PT Time Calculation (min) 53 min    Activity Tolerance Patient tolerated treatment well    Behavior During Therapy [facility] for tasks assessed/performed               Past Medical History:  Diagnosis Date   Anxiety    Colon polyp    GERD (gastroesophageal reflux disease)    HTN (hypertension), benign 09/05/2011   Hyperlipidemia, mixed 09/05/2011   Leukopenia 09/05/2011   Sleep apnea    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/19/2013   no defecits   Past Surgical History:  Procedure Laterality Date   BREAST LUMPECTOMY WITH RADIOACTIVE SEED AND SENTINEL LYMPH NODE BIOPSY Left 06/25/2022   Procedure: LEFT BREAST LUMPECTOMY WITH RADIOACTIVE SEED AND SENTINEL LYMPH NODE BIOPSY;  Surgeon: Jovita Kussmaul, MD;  Location: [facility];  Service: General;  Laterality: Left;   KNEE ARTHROSCOPY Right 2005 approx   torn meniscus   TUBAL LIGATION     twice (first failed)   uterine ablation     Patient Active Problem List   Diagnosis Date Noted   Malignant neoplasm of lower-outer quadrant of left breast of female, estrogen receptor positive ([name]) 06/16/2022   Genetic testing 06/16/2022   Restless leg syndrome 11/08/2018   Left ventricular hypertrophy 11/08/2018   Depression 11/03/2018   Family history of colon cancer in mother 11/03/2018   History of vitamin D deficiency 11/03/2018   Night terrors, adult 11/03/2018   History of CVA (cerebrovascular accident) 07/12/2014   Obstructive sleep apnea 01/08/2014   GERD (gastroesophageal reflux disease) 12/19/2013   HTN (hypertension), benign 09/05/2011   Hyperlipidemia, mixed 09/05/2011    PCP: Rita Ohara, MD  REFERRING PROVIDER: Nicholas Lose, MD  REFERRING DIAG: C50.512,Z17.0 (ICD-10-CM) - Malignant neoplasm of lower-outer quadrant of left breast of female, estrogen receptor positive ([name])  THERAPY DIAG:  Stiffness of left shoulder, not elsewhere classified  Left shoulder pain, unspecified chronicity  Abnormal posture  Malignant neoplasm of lower-outer quadrant of left breast of female, estrogen receptor positive ([name])  Aftercare following surgery for neoplasm  Muscle weakness (generalized)  At risk for lymphedema  ONSET DATE: 06/25/2022  Rationale for Evaluation and Treatment: Rehabilitation  SUBJECTIVE:  SUBJECTIVE STATEMENT:   The shoulder is doing well   EVAL: pt states she can/t reach reach around to the back seat. And she can't do the exercises she has always done with her arm. She still has tingling and pain  in her upper arm.  She sometimes has to take a tylenol at night. She had been doing yoga   PERTINENT HISTORY: 06/25/2022: Left lumpectomy: Grade 2 IDC 1.7 cm, margins negative, 0/5 sentinel nodes negative ER 95%, PR 80%, HER2 negative, Ki-67 15%  Adjuvant radiation completed 09/14/2022 No chemo, but is on letrozole, pt did not tolerate anastrozole    PAIN:  Are you having pain? No NPRS scale: 0/10 at rest  PRECAUTIONS: at risk for lymphedemaa   WEIGHT BEARING RESTRICTIONS: No  FALLS:  Has patient fallen in last 6 months? No  LIVING ENVIRONMENT: Lives with: liives with spouse, no pets  Lives in: House/apartment Stairs: Yes; up to husband's study  Has following equipment at home: None  OCCUPATION: retired   Psychologist, clinical: travel, exercise , spend time with family   PATIENT GOALS: to get rid of shoulder pain and get back to exercising and get movement and strength back     OBJECTIVE: PALPATION:  EVAL: Firmness and pain in pec major with pinpoint tenderness at corocoid process at pec minor insertions. She also has palpable tightness in left upper trap, lat and scapular muscles.   OBSERVATIONS / OTHER ASSESSMENTS:  EVAL: scapular asymmetry in bilateral shoulder elevation .Pt has discoloration in left axilla from radiation and still has some bruising and fullness in left breast medical to nipple and scar.  She is wearing a light compression bra that she got from [employer].   UPPER EXTREMITY AROM/PROM: A/PROM RIGHT   eval   Shoulder extension 55  Shoulder flexion 165  Shoulder abduction 180  Shoulder internal rotation 40  Shoulder external rotation 90    (Blank rows = not tested)  A/PROM LEFT   eval 12/15/22  Shoulder extension 55   Shoulder flexion 140 150  Shoulder abduction 150 160  Shoulder internal rotation 20* unable to hold arm up  Behind the back normal  Shoulder external rotation 80 80    (Blank rows = not tested)  UPPER EXTREMITY STRENGTH:  4+/5 without pain bil  LLYMPHEDEMA ASSESSMENTS:  SURGERY TYPE/DATE: left lumpectomy 06/25/2022 0/5 nodes  NUMBER OF LYMPH NODES REMOVED: 5 CHEMOTHERAPY: no RADIATION:yes  HORMONE TREATMENT: yes INFECTIONS: no  LYMPHEDEMA ASSESSMENTS:   LANDMARK RIGHT  eval  10 cm proximal to olecranon process 28.5  Olecranon process 26  10 cm proximal to ulnar styloid process 19.5  Just proximal to ulnar styloid process 16  Across hand at thumb web space 19.5  At base of 2nd digit 6.2  (Blank rows = not tested)  LANDMARK LEFT  eval  10 cm proximal to olecranon process 28  Olecranon process 25.5  10 cm proximal to ulnar styloid process 19.5  Just proximal to ulnar styloid process 15.5  Across hand at thumb web space `9.5  At base of 2nd digit 6  (Blank rows = not tested)   TODAY'S TREATMENT:  01/05/23 Pulleys flexion x 20min and abduction x 43min Yellow band row 2x 15 Bil extension yellow  2x 15 bil ER yellow 2x12 2# flexion with 2x10, scaption 2x10, bicep curl 2x10 Wiggle stick bil f/b and side to side x 30" each (unable really on the left side)  Small purple ball circles on the wall x 10 c/cc  and up/down, side to side  PROM to tolerance  Joint mob: [place] AP and inferior glides grade IV- 4x30" each  12/27/22 Pulleys flexion x 43min and abduction x 35min Yellow band row 2x 15 Bil extension yellow 2x 15 bil ER yellow 2x12 2# flexion with 2x10 Small red ball circles on the wall x 10 c/cc and up/down, side to side  PROM to tolerance  Joint mob: [place] AP and inferior glides grade IV- 4x30" each  12/20/22 Pulleys flexion x 29min and abduction x 89min Yellow band row 2x 15 Bil extension yellow 2x 15 Lt ER yellow x10 and then x 8  Yellow flexion with intial cueing 2x5 with cueing to keep shoulder blades relax Small red ball circles on the wall x 10 c/cc and up/down, side to side  PROM to tolerance all directions still needing vcs to relax and some pinch with abduction when not relaxed  Joint mob: [place] AP and inferior glides grade IV- 4x30" each  PATIENT EDUCATION:  Education details: meeks decompression and supine dowel shoulder flexion  Person educated: Patient Education method: Consulting civil engineer, Media planner, and Handouts Education comprehension: verbalized understanding and returned demonstration  HOME EXERCISE PROGRAM: Access Code: [email or web address] URL: [email or web address] Date: 12/20/2022 Prepared by: Shan Levans  Exercises - Wall Push Up with Plus  - 1 x daily - 7 x weekly - 1 sets - 10 reps - no hold - Standing Row with Anchored Resistance Band with PLB  - 1 x daily - 2-3 x weekly - 1-3 sets - 10 reps - no hold - Shoulder extension with resistance - Neutral  - 1 x daily - 2-3 x weekly - 1-3 sets - 10 reps - no hold - Standing Shoulder External Rotation with Resistance  - 1 x daily - 2-3 x weekly - 1-3 sets - 10 reps - no hold - Standing Shoulder Single Arm Flexion  with Anchored Resistance  - 1 x daily - 2-3 x weekly - 1-3 sets - 10 reps - no hold - Standing Wall [employer] with Mini Swiss Ball  - 1 x daily - 2-3 x weekly - 1-3 sets - 10 reps - no hold - Standing Pec Stretch at Wall  - 1 x daily - 7 x weekly - 1-3 sets - 10 reps - 20-30 seconds hold  ASSESSMENT: CLINICAL IMPRESSION:   1 more visit as planned.  Pt is doing very well.   OBJECTIVE IMPAIRMENTS: decreased activity tolerance, decreased coordination, decreased endurance, decreased knowledge of condition, decreased knowledge of use of DME, decreased ROM, decreased strength, increased fascial restrictions, impaired perceived functional ability, increased muscle spasms, impaired UE functional use, postural dysfunction, and pain.   ACTIVITY LIMITATIONS: reaching behind her and reaching overhead   PARTICIPATION LIMITATIONS: possibly family obligations   PERSONAL FACTORS: recent surgery, radiation, on hormone blocker are also affecting patient's functional outcome.   REHAB POTENTIAL: Good  CLINICAL DECISION MAKING: Stable/uncomplicated  EVALUATION COMPLEXITY: Low  GOALS: Goals reviewed with patient? Yes  LONG  TERM GOALS: Target date: 12/20/2022  Pt will decrease QuickDASH to < 10 indicating improvement in left UE function  Baseline:36.35, 15% Goal status: PROGRESSING  2.  Pt will reports she can reach to backseat of car without pain  Baseline: painful  Goal status: PROGRESSING  3.  Pt will reports she is indepenent in home program for shoulder ROM and strengthening  Baseline: needs review  Goal status: PROGRESSING  4.  Pt will increrase left shoulder abduction to 160 degrees without pain  Baseline: 150 and pain  Goal status:MET  5.  Pt will be able to hold left arm up at 90 degrees of shoulder abduction without pain  Baseline: unable to hold arm up at 90 to test internal rotation due to pain  Goal status: MET   PLAN:  PT FREQUENCY: 2x/week  PT DURATION: 4  weeks  PLANNED INTERVENTIONS: Therapeutic exercises, Therapeutic activity, Neuromuscular re-education, Patient/Family education, Self Care, Joint mobilization, and Manual lymph drainage. Manual technques, iontophoresis, dry needling if pt does not improve with manual work   PLAN FOR NEXT SESSION: do manual work to tight soft tissue at  pecs, upper traps.lats and scapular area.  PROM and stretching to left shoulder, progress home program.    Stark Bray, PT 01/05/2023, 10:55 AM

## 2023-01-10 ENCOUNTER — Encounter: Payer: Self-pay | Admitting: Rehabilitation

## 2023-01-10 ENCOUNTER — Ambulatory Visit: Payer: PPO | Attending: Hematology and Oncology | Admitting: Rehabilitation

## 2023-01-10 DIAGNOSIS — M6281 Muscle weakness (generalized): Secondary | ICD-10-CM | POA: Insufficient documentation

## 2023-01-10 DIAGNOSIS — M25612 Stiffness of left shoulder, not elsewhere classified: Secondary | ICD-10-CM | POA: Diagnosis not present

## 2023-01-10 DIAGNOSIS — M25512 Pain in left shoulder: Secondary | ICD-10-CM | POA: Diagnosis not present

## 2023-01-10 DIAGNOSIS — C50512 Malignant neoplasm of lower-outer quadrant of left female breast: Secondary | ICD-10-CM | POA: Insufficient documentation

## 2023-01-10 DIAGNOSIS — R293 Abnormal posture: Secondary | ICD-10-CM | POA: Diagnosis not present

## 2023-01-10 DIAGNOSIS — Z9189 Other specified personal risk factors, not elsewhere classified: Secondary | ICD-10-CM | POA: Diagnosis not present

## 2023-01-10 DIAGNOSIS — Z170 Estrogen receptor positive status [ER+]: Secondary | ICD-10-CM | POA: Insufficient documentation

## 2023-01-10 DIAGNOSIS — Z483 Aftercare following surgery for neoplasm: Secondary | ICD-10-CM | POA: Diagnosis not present

## 2023-01-10 NOTE — Therapy (Signed)
OUTPATIENT PHYSICAL THERAPY ONCOLOGY TREATMENT  Patient Name: Carol Harris MRN: [id number] DOB:[birth year], 67 y.o., female Today's Date: 01/10/2023  END OF SESSION:  PT End of Session - 01/10/23 1104     Visit Number 13    Number of Visits 17    Date for PT Re-Evaluation 01/12/23    PT Start Time 1105    PT Stop Time 1143    PT Time Calculation (min) 38 min    Activity Tolerance Patient tolerated treatment well    Behavior During Therapy [facility] for tasks assessed/performed               Past Medical History:  Diagnosis Date   Anxiety    Colon polyp    GERD (gastroesophageal reflux disease)    HTN (hypertension), benign 09/05/2011   Hyperlipidemia, mixed 09/05/2011   Leukopenia 09/05/2011   Sleep apnea    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/19/2013   no defecits   Past Surgical History:  Procedure Laterality Date   BREAST LUMPECTOMY WITH RADIOACTIVE SEED AND SENTINEL LYMPH NODE BIOPSY Left 06/25/2022   Procedure: LEFT BREAST LUMPECTOMY WITH RADIOACTIVE SEED AND SENTINEL LYMPH NODE BIOPSY;  Surgeon: Jovita Kussmaul, MD;  Location: [place];  Service: General;  Laterality: Left;   KNEE ARTHROSCOPY Right 2005 approx   torn meniscus   TUBAL LIGATION     twice (first failed)   uterine ablation     Patient Active Problem List   Diagnosis Date Noted   Malignant neoplasm of lower-outer quadrant of left breast of female, estrogen receptor positive 06/16/2022   Genetic testing 06/16/2022   Restless leg syndrome 11/08/2018   Left ventricular hypertrophy 11/08/2018   Depression 11/03/2018   Family history of colon cancer in mother 11/03/2018   History of vitamin D deficiency 11/03/2018   Night terrors, adult 11/03/2018   History of CVA (cerebrovascular accident) 07/12/2014   Obstructive sleep apnea 01/08/2014   GERD (gastroesophageal reflux disease) 12/19/2013   HTN (hypertension), benign 09/05/2011   Hyperlipidemia, mixed 09/05/2011    PCP: Rita Ohara,  MD  REFERRING PROVIDER: Nicholas Lose, MD  REFERRING DIAG: C50.512,Z17.0 (ICD-10-CM) - Malignant neoplasm of lower-outer quadrant of left breast of female, estrogen receptor positive ([place])  THERAPY DIAG:  Stiffness of left shoulder, not elsewhere classified  Left shoulder pain, unspecified chronicity  Abnormal posture  Malignant neoplasm of lower-outer quadrant of left breast of female, estrogen receptor positive  Aftercare following surgery for neoplasm  Muscle weakness (generalized)  At risk for lymphedema  ONSET DATE: 06/25/2022  Rationale for Evaluation and Treatment: Rehabilitation  SUBJECTIVE:  SUBJECTIVE STATEMENT:  I feel ready to be done  EVAL: pt states she can/t reach reach around to the back seat. And she can't do the exercises she has always done with her arm. She still has tingling and pain  in her upper arm.  She sometimes has to take a tylenol at night. She had been doing yoga   PERTINENT HISTORY: 06/25/2022: Left lumpectomy: Grade 2 IDC 1.7 cm, margins negative, 0/5 sentinel nodes negative ER 95%, PR 80%, HER2 negative, Ki-67 15%  Adjuvant radiation completed 09/14/2022 No chemo, but is on letrozole, pt did not tolerate anastrozole    PAIN:  Are you having pain? No NPRS scale: 0/10 at rest  PRECAUTIONS: at risk for lymphedemaa   WEIGHT BEARING RESTRICTIONS: No  FALLS:  Has patient fallen in last 6 months? No  LIVING ENVIRONMENT: Lives with: liives with spouse, no pets  Lives in: House/apartment Stairs: Yes; up to husband's study  Has following equipment at home: None  OCCUPATION: retired   Psychologist, clinical: travel, exercise , spend time with family   PATIENT GOALS: to get rid of shoulder pain and get back to exercising and get movement and strength back     OBJECTIVE: PALPATION:  EVAL: Firmness and pain in pec major with pinpoint tenderness at corocoid process at pec minor insertions. She also has palpable tightness in left upper trap, lat and scapular muscles.   OBSERVATIONS / OTHER ASSESSMENTS:  EVAL: scapular asymmetry in bilateral shoulder elevation .Pt has discoloration in left axilla from radiation and still has some bruising and fullness in left breast medical to nipple and scar.  She is wearing a light compression bra that she got from [employer].   UPPER EXTREMITY AROM/PROM: A/PROM RIGHT   eval   Shoulder extension 55  Shoulder flexion 165  Shoulder abduction 180  Shoulder internal rotation 40  Shoulder external rotation 90    (Blank rows = not tested)  A/PROM LEFT   eval 12/15/22  Shoulder extension 55   Shoulder flexion 140 150  Shoulder abduction 150 160  Shoulder internal rotation 20* unable to hold arm up  Behind the back normal  Shoulder external rotation 80 80    (Blank rows = not tested)  UPPER EXTREMITY STRENGTH:  4+/5 without pain bil  LLYMPHEDEMA ASSESSMENTS:  SURGERY TYPE/DATE: left lumpectomy 06/25/2022 0/5 nodes  NUMBER OF LYMPH NODES REMOVED: 5 CHEMOTHERAPY: no RADIATION:yes  HORMONE TREATMENT: yes INFECTIONS: no  LYMPHEDEMA ASSESSMENTS:   LANDMARK RIGHT  eval  10 cm proximal to olecranon process 28.5  Olecranon process 26  10 cm proximal to ulnar styloid process 19.5  Just proximal to ulnar styloid process 16  Across hand at thumb web space 19.5  At base of 2nd digit 6.2  (Blank rows = not tested)  LANDMARK LEFT  eval  10 cm proximal to olecranon process 28  Olecranon process 25.5  10 cm proximal to ulnar styloid process 19.5  Just proximal to ulnar styloid process 15.5  Across hand at thumb web space `9.5  At base of 2nd digit 6  (Blank rows = not tested)   TODAY'S TREATMENT:  01/10/23 Reviewed final HEP with performance of each  Pulleys flexion x 31min and abduction x  69min Yellow band row 3x10 Bil extension yellow 3x10 bil ER yellow 3x10 3# flexion with 2x10 small purple ball circles on the wall x 10 c/cc x 3  01/05/23 Pulleys flexion x 49min and abduction x 61min Yellow band row 2x 15 Bil  extension yellow 2x 15 bil ER yellow 2x12 2# flexion with 2x10, scaption 2x10, bicep curl 2x10 Wiggle stick bil f/b and side to side x 30" each (unable really on the left side)  Small purple ball circles on the wall x 10 c/cc and up/down, side to side  PROM to tolerance  Joint mob: [place] AP and inferior glides grade IV- 4x30" each  12/27/22 Pulleys flexion x 34min and abduction x 40min Yellow band row 2x 15 Bil extension yellow 2x 15 bil ER yellow 2x12 2# flexion with 2x10 Small red ball circles on the wall x 10 c/cc and up/down, side to side  PROM to tolerance  Joint mob: [place] AP and inferior glides grade IV- 4x30" each  12/20/22 Pulleys flexion x 65min and abduction x 77min Yellow band row 2x 15 Bil extension yellow 2x 15 Lt ER yellow x10 and then x 8  Yellow flexion with intial cueing 2x5 with cueing to keep shoulder blades relax Small red ball circles on the wall x 10 c/cc and up/down, side to side  PROM to tolerance all directions still needing vcs to relax and some pinch with abduction when not relaxed  Joint mob: [place] AP and inferior glides grade IV- 4x30" each  PATIENT EDUCATION:  Education details: meeks decompression and supine dowel shoulder flexion  Person educated: Patient Education method: Consulting civil engineer, Media planner, and Handouts Education comprehension: verbalized understanding and returned demonstration  HOME EXERCISE PROGRAM: Access Code: [email or web address] URL: [email or web address] Date: 12/20/2022 Prepared by: Shan Levans  Exercises - Wall Push Up with Plus  - 1 x daily - 7 x weekly - 1 sets - 10 reps - no hold - Standing Row with Anchored Resistance Band with PLB  - 1 x daily - 2-3 x weekly - 1-3 sets - 10 reps - no hold - Shoulder  extension with resistance - Neutral  - 1 x daily - 2-3 x weekly - 1-3 sets - 10 reps - no hold - Standing Shoulder External Rotation with Resistance  - 1 x daily - 2-3 x weekly - 1-3 sets - 10 reps - no hold - Standing Shoulder Single Arm Flexion with Anchored Resistance  - 1 x daily - 2-3 x weekly - 1-3 sets - 10 reps - no hold - Standing Wall [employer] with Mini Swiss Ball  - 1 x daily - 2-3 x weekly - 1-3 sets - 10 reps - no hold - Standing Pec Stretch at Wall  - 1 x daily - 7 x weekly - 1-3 sets - 10 reps - 20-30 seconds hold  ASSESSMENT: CLINICAL IMPRESSION: All goals have been met.  Will continue SOZO screenings.    OBJECTIVE IMPAIRMENTS: decreased activity tolerance, decreased coordination, decreased endurance, decreased knowledge of condition, decreased knowledge of use of DME, decreased ROM, decreased strength, increased fascial restrictions, impaired perceived functional ability, increased muscle spasms, impaired UE functional use, postural dysfunction, and pain.   ACTIVITY LIMITATIONS: reaching behind her and reaching overhead   PARTICIPATION LIMITATIONS: possibly family obligations   PERSONAL FACTORS: recent surgery, radiation, on hormone blocker are also affecting patient's functional outcome.   REHAB POTENTIAL: Good  CLINICAL DECISION MAKING: Stable/uncomplicated  EVALUATION COMPLEXITY: Low  GOALS: Goals reviewed with patient? Yes  LONG  TERM GOALS: Target date: 12/20/2022  Pt will decrease QuickDASH to < 10 indicating improvement in left UE function  Baseline:36.35, 15%, 7% Goal status: MET  2.  Pt will reports she can reach to backseat of car without pain  Baseline: painful  Goal status: MET  3.  Pt will reports she is indepenent in home program for shoulder ROM and strengthening  Baseline: needs review  Goal status: MET  4.  Pt will increrase left shoulder abduction to 160 degrees without pain  Baseline: 150 and pain  Goal status:MET  5.  Pt will be  able to hold left arm up at 90 degrees of shoulder abduction without pain  Baseline: unable to hold arm up at 90 to test internal rotation due to pain  Goal status: MET   PLAN:  PT FREQUENCY: 2x/week  PT DURATION: 4 weeks  PLANNED INTERVENTIONS: Therapeutic exercises, Therapeutic activity, Neuromuscular re-education, Patient/Family education, Self Care, Joint mobilization, and Manual lymph drainage. Manual technques, iontophoresis, dry needling if pt does not improve with manual work   PLAN FOR NEXT SESSION: do manual work to tight soft tissue at  pecs, upper traps.lats and scapular area.  PROM and stretching to left shoulder, progress home program.   PHYSICAL THERAPY DISCHARGE SUMMARY  Visits from Start of Care: 13  Current functional level related to goals / functional outcomes: All goals met   Remaining deficits: Lymphedema risk   Education / Equipment: Final HEP  Plan: Patient agrees to discharge.   Patient is being discharged due to meeting the stated rehab goals.        Stark Bray, PT 01/10/2023, 12:31 PM

## 2023-01-11 DIAGNOSIS — C50912 Malignant neoplasm of unspecified site of left female breast: Secondary | ICD-10-CM | POA: Diagnosis not present

## 2023-01-17 DIAGNOSIS — G4733 Obstructive sleep apnea (adult) (pediatric): Secondary | ICD-10-CM | POA: Diagnosis not present

## 2023-01-25 ENCOUNTER — Encounter: Payer: Self-pay | Admitting: *Deleted

## 2023-01-25 NOTE — Progress Notes (Signed)
Received fax from [facility] requesting dental clearance for upcoming dental surgery.  RN successfully faxed signed letter to proceed with dental surgery.  No recommendations to hold letrozole per MD.

## 2023-02-21 ENCOUNTER — Ambulatory Visit: Payer: PPO | Attending: Hematology and Oncology

## 2023-02-21 VITALS — Wt 160.1 lb

## 2023-02-21 DIAGNOSIS — Z483 Aftercare following surgery for neoplasm: Secondary | ICD-10-CM | POA: Insufficient documentation

## 2023-02-21 NOTE — Therapy (Signed)
OUTPATIENT PHYSICAL THERAPY SOZO SCREENING NOTE   Patient Name: Carol Harris MRN: [id number] DOB:06/24/1956, 67 y.o., female Today's Date: 02/21/2023  PCP: Joselyn Arrow, MD REFERRING PROVIDER: Serena Croissant, MD   PT End of Session - 02/21/23 1050     Visit Number 13   # unchanged due to screen only   PT Start Time 1048    PT Stop Time 1052    PT Time Calculation (min) 4 min    Activity Tolerance Patient tolerated treatment well    Behavior During Therapy [facility] for tasks assessed/performed             Past Medical History:  Diagnosis Date   Anxiety    Colon polyp    GERD (gastroesophageal reflux disease)    HTN (hypertension), benign 09/05/2011   Hyperlipidemia, mixed 09/05/2011   Leukopenia 09/05/2011   Sleep apnea    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   no defecits   Past Surgical History:  Procedure Laterality Date   BREAST LUMPECTOMY WITH RADIOACTIVE SEED AND SENTINEL LYMPH NODE BIOPSY Left 06/25/2022   Procedure: LEFT BREAST LUMPECTOMY WITH RADIOACTIVE SEED AND SENTINEL LYMPH NODE BIOPSY;  Surgeon: Griselda Miner, MD;  Location: [facility];  Service: General;  Laterality: Left;   KNEE ARTHROSCOPY Right 2005 approx   torn meniscus   TUBAL LIGATION     twice (first failed)   uterine ablation     Patient Active Problem List   Diagnosis Date Noted   Malignant neoplasm of lower-outer quadrant of left breast of female, estrogen receptor positive ([facility]) 06/16/2022   Genetic testing 06/16/2022   Restless leg syndrome 11/08/2018   Left ventricular hypertrophy 11/08/2018   Depression 11/03/2018   Family history of colon cancer in mother 11/03/2018   History of vitamin D deficiency 11/03/2018   Night terrors, adult 11/03/2018   History of CVA (cerebrovascular accident) 07/12/2014   Obstructive sleep apnea 01/08/2014   GERD (gastroesophageal reflux disease) 12/19/2013   HTN (hypertension), benign 09/05/2011   Hyperlipidemia, mixed 09/05/2011     REFERRING DIAG: left breast cancer at risk for lymphedema  THERAPY DIAG:  Aftercare following surgery for neoplasm  PERTINENT HISTORY: 06/25/2022: Left lumpectomy: Grade 2 IDC 1.7 cm, margins negative, 0/5 sentinel nodes negative ER 95%, PR 80%, HER2 negative, Ki-67 15%  Adjuvant radiation completed 09/14/2022 No chemo, but is on letrozole, pt did not tolerate anastrozole   PRECAUTIONS: left UE Lymphedema risk, None  SUBJECTIVE: Pt returns for her 3 month L-Dex screen.   PAIN:  Are you having pain? No  SOZO SCREENING: Patient was assessed today using the SOZO machine to determine the lymphedema index score. This was compared to her baseline score. It was determined that she is within the recommended range when compared to her baseline and no further action is needed at this time. She will continue SOZO screenings. These are done every 3 months for 2 years post operatively followed by every 6 months for 2 years, and then annually.   L-DEX FLOWSHEETS - 02/21/23 1000       L-DEX LYMPHEDEMA SCREENING   Measurement Type Unilateral    L-DEX MEASUREMENT EXTREMITY Upper Extremity    POSITION  Standing    DOMINANT SIDE Right    At Risk Side Left    BASELINE SCORE (UNILATERAL) 1.2    L-DEX SCORE (UNILATERAL) 6.2    VALUE CHANGE (UNILAT) 5               Berna Spare  [name], PTA 02/21/2023, 10:53 AM

## 2023-03-22 DIAGNOSIS — G4733 Obstructive sleep apnea (adult) (pediatric): Secondary | ICD-10-CM | POA: Diagnosis not present

## 2023-03-28 DIAGNOSIS — G4733 Obstructive sleep apnea (adult) (pediatric): Secondary | ICD-10-CM | POA: Diagnosis not present

## 2023-04-26 DIAGNOSIS — C50919 Malignant neoplasm of unspecified site of unspecified female breast: Secondary | ICD-10-CM | POA: Diagnosis not present

## 2023-04-26 DIAGNOSIS — E559 Vitamin D deficiency, unspecified: Secondary | ICD-10-CM | POA: Diagnosis not present

## 2023-04-26 DIAGNOSIS — I10 Essential (primary) hypertension: Secondary | ICD-10-CM | POA: Diagnosis not present

## 2023-04-26 DIAGNOSIS — E785 Hyperlipidemia, unspecified: Secondary | ICD-10-CM | POA: Diagnosis not present

## 2023-04-26 DIAGNOSIS — H259 Unspecified age-related cataract: Secondary | ICD-10-CM | POA: Diagnosis not present

## 2023-04-26 DIAGNOSIS — F332 Major depressive disorder, recurrent severe without psychotic features: Secondary | ICD-10-CM | POA: Diagnosis not present

## 2023-04-26 DIAGNOSIS — F3342 Major depressive disorder, recurrent, in full remission: Secondary | ICD-10-CM | POA: Diagnosis not present

## 2023-04-26 DIAGNOSIS — E663 Overweight: Secondary | ICD-10-CM | POA: Diagnosis not present

## 2023-04-26 DIAGNOSIS — M8580 Other specified disorders of bone density and structure, unspecified site: Secondary | ICD-10-CM | POA: Diagnosis not present

## 2023-04-26 DIAGNOSIS — F900 Attention-deficit hyperactivity disorder, predominantly inattentive type: Secondary | ICD-10-CM | POA: Diagnosis not present

## 2023-04-26 DIAGNOSIS — G4733 Obstructive sleep apnea (adult) (pediatric): Secondary | ICD-10-CM | POA: Diagnosis not present

## 2023-04-26 DIAGNOSIS — Z8673 Personal history of transient ischemic attack (TIA), and cerebral infarction without residual deficits: Secondary | ICD-10-CM | POA: Diagnosis not present

## 2023-04-26 DIAGNOSIS — F411 Generalized anxiety disorder: Secondary | ICD-10-CM | POA: Diagnosis not present

## 2023-04-26 DIAGNOSIS — Z923 Personal history of irradiation: Secondary | ICD-10-CM | POA: Diagnosis not present

## 2023-04-26 DIAGNOSIS — Z7982 Long term (current) use of aspirin: Secondary | ICD-10-CM | POA: Diagnosis not present

## 2023-04-27 DIAGNOSIS — G4733 Obstructive sleep apnea (adult) (pediatric): Secondary | ICD-10-CM | POA: Diagnosis not present

## 2023-04-30 ENCOUNTER — Other Ambulatory Visit: Payer: Self-pay | Admitting: Family Medicine

## 2023-04-30 DIAGNOSIS — I10 Essential (primary) hypertension: Secondary | ICD-10-CM

## 2023-05-24 DIAGNOSIS — G4733 Obstructive sleep apnea (adult) (pediatric): Secondary | ICD-10-CM | POA: Diagnosis not present

## 2023-05-24 DIAGNOSIS — N63 Unspecified lump in unspecified breast: Secondary | ICD-10-CM | POA: Diagnosis not present

## 2023-05-24 DIAGNOSIS — Z853 Personal history of malignant neoplasm of breast: Secondary | ICD-10-CM | POA: Diagnosis not present

## 2023-05-26 ENCOUNTER — Encounter: Payer: Self-pay | Admitting: Internal Medicine

## 2023-05-26 ENCOUNTER — Other Ambulatory Visit: Payer: Self-pay | Admitting: Family Medicine

## 2023-05-26 ENCOUNTER — Other Ambulatory Visit: Payer: Self-pay | Admitting: *Deleted

## 2023-05-26 DIAGNOSIS — E782 Mixed hyperlipidemia: Secondary | ICD-10-CM

## 2023-05-26 DIAGNOSIS — N63 Unspecified lump in unspecified breast: Secondary | ICD-10-CM

## 2023-05-26 DIAGNOSIS — Z170 Estrogen receptor positive status [ER+]: Secondary | ICD-10-CM

## 2023-05-26 NOTE — Telephone Encounter (Signed)
Pt does not need a  refill on this

## 2023-05-27 ENCOUNTER — Encounter: Payer: Self-pay | Admitting: Hematology and Oncology

## 2023-05-31 ENCOUNTER — Encounter: Payer: Self-pay | Admitting: *Deleted

## 2023-05-31 DIAGNOSIS — E559 Vitamin D deficiency, unspecified: Secondary | ICD-10-CM | POA: Insufficient documentation

## 2023-05-31 DIAGNOSIS — R7301 Impaired fasting glucose: Secondary | ICD-10-CM | POA: Insufficient documentation

## 2023-05-31 NOTE — Progress Notes (Unsigned)
No chief complaint on file.   Carol Harris is a 67 y.o. female who presents for annual physical exam, Medicare wellness visit and follow-up on chronic medical conditions.    Hypertension:  She is compliant with taking Losartan 50mg  daily and atenolol 25mg  BID. BP's have been running  Her BP monitor was verified as accurate in 06/2022. She denies headaches, dizziness, chest pain, palpitations, edema or side effects to medications.    BP Readings from Last 3 Encounters:  12/17/22 128/88  11/12/22 117/79  10/15/22 (!) 128/90    Hyperlipidemia: patient is compliant with taking atorvastatin and following low cholesterol diet. She denies side effects. She eats fish and some poultry, no red meat. Lots of vegetables and vegetable protein. +cheese, butter. 3 eggs/week She is due for recheck.  Lipids were at goal on current regimen last year.  Lab Results  Component Value Date   CHOL 165 05/20/2022   HDL 76 05/20/2022   LDLCALC 73 05/20/2022   TRIG 90 05/20/2022   CHOLHDL 2.2 05/20/2022   Impaired fasting glucose--Fasting sugar was 92 last year, with A1c of 5.7.   She tries to limit sweets and carbs in her diet.   OSA: compliant with CPAP. She reports doing well, feeling refreshed in the mornings, and no daytime somnolence.  She is continuing to benefit from CPAP use.  Restless legs--relieved by OTC Mg supplement.  Depression and anxiety:  She is taking Wellbutrin, and sees therapist regularly. Wellbutrin dose was increased to 450mg  last year (when had increased stressors.) Stressors last year included daughter with mental illness, husband has metastatic prostate cancer. His cancer metastasized to the bone, is in his spine, planning for more radiation.  Daughter quit her job; saw new psych, started new med, and was doing a little better. Pt then also diagnosed with breast cancer. Currently she reports ***  Breast Cancer, left:  diagnosed 06/2022 with stage 1A invasive ductal  carcinoma, ER+/PR+/HER2-. She was treated with lumpectomy, adjuvant radiation therapy, and anti-estrogen therapy with Letrozole which was started in 11/2022. She is tolerating this well.  She underwent mammogram and ultrasound earlier this month (felt to have a palpable abnormality related to scarring).   Vitamin D deficiency: Last level was normal at 33.9 in 11/2019. She takes 5000 IU 4x/week, and has been doing so for quite a while.  H/o CVA. MRI in 12/2013 showed acute L thalamic infarct without hemorrhage.  She takes aspirin and statin.  She denies any neurologic symptoms. Just the tremor as reported above, stable.  She has a remote history of leukopenia that was evaluated by Dr. Cyndie Chime with no apparent follow-up needed.  Lab Results  Component Value Date   WBC 4.8 06/16/2022   HGB 14.1 06/16/2022   HCT 40.5 06/16/2022   MCV 91.0 06/16/2022   PLT 234 06/16/2022    Immunization History  Administered Date(s) Administered   DTaP 06/11/2001   Fluad Quad(high Dose 65+) 07/11/2019, 08/13/2021, 06/21/2022   Influenza,inj,Quad PF,6+ Mos 11/03/2018   PFIZER Comirnaty(Gray Top)Covid-19 Tri-Sucrose Vaccine 01/21/2021   PFIZER(Purple Top)SARS-COV-2 Vaccination 11/17/2019, 12/12/2019, 07/13/2020   PNEUMOCOCCAL CONJUGATE-20 05/20/2021   Pfizer Covid-19 Vaccine Bivalent Booster 53yrs & up 10/14/2021   Respiratory Syncytial Virus Vaccine,Recomb Aduvanted(Arexvy) 12/30/2022   Tdap 04/21/2011   Unspecified SARS-COV-2 Vaccination 07/14/2022   Zoster Recombinant(Shingrix) 11/03/2018, 01/15/2019   One side of her body swelled up after a pneumonia vaccine in the late 90's. Last Pap smear: per gynecologist, 02/2019 Last mammogram: 05/2023 (and [place], post-surgical scarring) Last  colonoscopy: 08/2021 normal, internal hemorrhoids, Dr. Kinnie Scales; had been getting q5 years due to FHx Last DEXA:  07/2022 T -1.5 at R fem neck  Dentist: every 6 months Ophtho: yearly Exercise:   walks 1-2 miles daily, about  30-40 minutes.  Does push-ups. Does all the lifting at home (due to husband's spinal mets).   Patient Care Team: Joselyn Arrow, MD as PCP - General (Family Medicine) Griselda Miner, MD as Consulting Physician (General Surgery) Serena Croissant, MD as Consulting Physician (Hematology and Oncology) Dorothy Puffer, MD as Consulting Physician (Radiation Oncology) GI: Dr. Kinnie Scales (retired) Counselor:  Zara Chess (not currently) Psych: Zella Ball (NP) at [facility]. Dentist: Dr. Carola Frost Ophtho: Dr. Blima Ledger GYN: Dr. Robin SearingTerri Piedra in the past  Depression Screening: Flowsheet Row Office Visit from 05/20/2022 in [facility]  PHQ-2 Total Score 0       Falls screen:     05/20/2022    1:32 PM 01/21/2021    2:29 PM 04/29/2020   11:07 AM 09/05/2019    9:47 AM  Fall Risk   Falls in the past year? 0 0 0 0  Comment    Emmi Telephone Survey: data to providers prior to load  Number falls in past yr: 0 0    Injury with Fall? 0 0    Risk for fall due to : No Fall Risks No Fall Risks    Follow up Falls evaluation completed Falls evaluation completed       Functional Status Survey:        End of Life Discussion:  Patient has a living will and medical power of attorney We do not have a copy on file   PMH, PSH, SH and FH were reviewed and updated    ROS:  Patient denies anorexia, fever, weight changes, headaches, vision changes, decreased hearing, URI symptoms, breast concerns, chest pain, palpitations, dizziness, syncope, dyspnea on exertion, cough, swelling, nausea, vomiting, diarrhea, constipation, abdominal pain, melena, hematochezia, hematuria, incontinence, dysuria, vaginal bleeding, discharge, odor or itch, genital lesions, joint pains, numbness, tingling, weakness, tremor, suspicious skin lesions, abnormal bleeding/bruising, or enlarged lymph nodes.  Mild intermittent constipation, occ hemorrhoid bleed if straining. No longer has heartburn (just with  major stress) Anxiety and depression are controlled Slight worsening of L hand tremor with stress.    PHYSICAL EXAM:  There were no vitals taken for this visit.  Wt Readings from Last 3 Encounters:  02/21/23 160 lb 2 oz (72.6 kg)  12/17/22 158 lb 11.2 oz (72 kg)  11/12/22 159 lb 2 oz (72.2 kg)    General Appearance: Alert, cooperative, no distress, appears stated age Head: Normocephalic, without obvious abnormality, atraumatic Eyes: PERRL, conjunctiva/corneas clear, EOM's intact, fundi benign Ears: Normal TM's and external ear canals Nose: Nares normal, mucosa normal, no drainage or sinus tenderness Throat: Lips, mucosa, and tongue normal; teeth and gums normal Neck: Supple, no lymphadenopathy;  thyroid:  no enlargement/ tenderness/nodules; no carotid bruit or JVD Lungs: Clear to auscultation bilaterally without wheezes, rales or ronchi; respirations unlabored Heart: Regular rate and rhythm, S1 and S2 normal, no murmur, rub or gallop Abdomen: Soft, non-tender, nondistended, normoactive bowel sounds, no masses, no hepatosplenomegaly Skin:  Skin color, texture, turgor normal, no rashes or lesions Lymph nodes: Cervical, supraclavicular, and axillary nodes normal Neurologic:  Normal strength, sensation and gait; reflexes 2+ and symmetric throughout. No tremor noted Psych: Normal mood, affect, hygiene and grooming.  Breast, pelvic and rectal exams were deferred to her GYN.  ASSESSMENT/PLAN:  Did she get TdaP from pharmacy??  Consider TSH (never done; needs sx to check)  Tremor, per pt--not appreciable today. Whether she truly has essential tremor, vs is tremulous with stress/anxiety isn't clear. She takes beta blocker, which would be helpful for tremor.  Clearly has no interference with her daily activities, treatment not indicated. Patient was reassured.  Discussed monthly self breast exams and yearly mammograms; at least 30 minutes of aerobic activity at least 5 days/week and  weight-bearing exercise 2x/week; proper sunscreen use reviewed; healthy diet, including goals of calcium and vitamin D intake and alcohol recommendations (less than or equal to 1 drink/day) reviewed; regular seatbelt use; changing batteries in smoke detectors, use of carbon monoxide detectors.  Immunization recommendations discussed--continue yearly high dose flu shots.  To get Tdap from the pharmacy. Updated COVID booster recommended when available in the Fall. Colonoscopy recommendations--UTD  MOST form completed, full code, full care Need copies of living will and healthcare POA, discussed.   Medicare Attestation I have personally reviewed: The patient's medical and social history Their use of alcohol, tobacco or illicit drugs Their current medications and supplements The patient's functional ability including ADLs,fall risks, home safety risks, cognitive, and hearing and visual impairment Diet and physical activities Evidence for depression or mood disorders  The patient's weight, height, BMI have been recorded in the chart.  I have made referrals, counseling, and provided education to the patient based on review of the above and I have provided the patient with a written personalized care plan for preventive services.     Lavonda Jumbo, MD

## 2023-05-31 NOTE — Patient Instructions (Incomplete)
HEALTH MAINTENANCE RECOMMENDATIONS:  It is recommended that you get at least 30 minutes of aerobic exercise at least 5 days/week (for weight loss, you may need as much as 60-90 minutes). This can be any activity that gets your heart rate up. This can be divided in 10-15 minute intervals if needed, but try and build up your endurance at least once a week.  Weight bearing exercise is also recommended twice weekly.  Eat a healthy diet with lots of vegetables, fruits and fiber.  "Colorful" foods have a lot of vitamins (ie green vegetables, tomatoes, red peppers, etc).  Limit sweet tea, regular sodas and alcoholic beverages, all of which has a lot of calories and sugar.  Up to 1 alcoholic drink daily may be beneficial for women (unless trying to lose weight, watch sugars).  Drink a lot of water.  Calcium recommendations are 1200-1500 mg daily (1500 mg for postmenopausal women or women without ovaries), and vitamin D 1000 IU daily.  This should be obtained from diet and/or supplements (vitamins), and calcium should not be taken all at once, but in divided doses.  Monthly self breast exams and yearly mammograms for women over the age of 64 is recommended.  Sunscreen of at least SPF 30 should be used on all sun-exposed parts of the skin when outside between the hours of 10 am and 4 pm (not just when at beach or pool, but even with exercise, golf, tennis, and yard work!)  Use a sunscreen that says "broad spectrum" so it covers both UVA and UVB rays, and make sure to reapply every 1-2 hours.  Remember to change the batteries in your smoke detectors when changing your clock times in the spring and fall. Carbon monoxide detectors are recommended for your home.  Use your seat belt every time you are in a car, and please drive safely and not be distracted with cell phones and texting while driving.   Carol Harris , Thank you for taking time to come for your Medicare Wellness Visit. I appreciate your ongoing  commitment to your health goals. Please review the following plan we discussed and let me know if I can assist you in the future.   This is a list of the screening recommended for you and due dates:  Health Maintenance  Topic Date Due   DTaP/Tdap/Td vaccine (3 - Td or Tdap) 04/20/2021   COVID-19 Vaccine (7 - 2023-24 season) 09/08/2022   Flu Shot  05/12/2023   Mammogram  05/23/2024   Medicare Annual Wellness Visit  05/31/2024   DEXA scan (bone density measurement)  07/19/2024   Colon Cancer Screening  08/28/2031   Pneumonia Vaccine  Completed   Hepatitis C Screening  Completed   Zoster (Shingles) Vaccine  Completed   HPV Vaccine  Aged Out    Please get tetanus booster (TdaP) from the pharmacy. Please get the updated COVID booster when available.  Please bring [place] copies of your Living Will and Healthcare Power of Attorney once completed and notarized so that it can be scanned into your medical chart.  Please drink more water (goal is for your urine to be very pale). You can take a stool softener daily such as colace (docusate sodium). Continue the fiber supplement daily. Try and eat a high fiber diet. If these measures aren't helping, then you should add Miralax. Miralax is an osmotic laxative.  It works slower, and won't cause the cramping that the dulcolax causes. It is safe to use longterm, but you  need to adjust the dose based on the frequency and consistency of the stools. Start out at 1 cap full daily, and cut back to adjust (some people do fine with 1/2 capful daily, other use a full cap every other day or less, and some people do fine taking the full dose daily.

## 2023-06-01 ENCOUNTER — Encounter: Payer: Self-pay | Admitting: Family Medicine

## 2023-06-01 ENCOUNTER — Ambulatory Visit (INDEPENDENT_AMBULATORY_CARE_PROVIDER_SITE_OTHER): Payer: PPO | Admitting: Family Medicine

## 2023-06-01 VITALS — BP 132/80 | HR 60 | Ht 64.0 in | Wt 153.2 lb

## 2023-06-01 DIAGNOSIS — K5900 Constipation, unspecified: Secondary | ICD-10-CM | POA: Diagnosis not present

## 2023-06-01 DIAGNOSIS — Z5181 Encounter for therapeutic drug level monitoring: Secondary | ICD-10-CM | POA: Diagnosis not present

## 2023-06-01 DIAGNOSIS — R7301 Impaired fasting glucose: Secondary | ICD-10-CM

## 2023-06-01 DIAGNOSIS — L853 Xerosis cutis: Secondary | ICD-10-CM | POA: Diagnosis not present

## 2023-06-01 DIAGNOSIS — C50512 Malignant neoplasm of lower-outer quadrant of left female breast: Secondary | ICD-10-CM | POA: Diagnosis not present

## 2023-06-01 DIAGNOSIS — I10 Essential (primary) hypertension: Secondary | ICD-10-CM | POA: Diagnosis not present

## 2023-06-01 DIAGNOSIS — R5383 Other fatigue: Secondary | ICD-10-CM

## 2023-06-01 DIAGNOSIS — L659 Nonscarring hair loss, unspecified: Secondary | ICD-10-CM | POA: Diagnosis not present

## 2023-06-01 DIAGNOSIS — Z170 Estrogen receptor positive status [ER+]: Secondary | ICD-10-CM

## 2023-06-01 DIAGNOSIS — G4733 Obstructive sleep apnea (adult) (pediatric): Secondary | ICD-10-CM

## 2023-06-01 DIAGNOSIS — Z0000 Encounter for general adult medical examination without abnormal findings: Secondary | ICD-10-CM

## 2023-06-01 DIAGNOSIS — E559 Vitamin D deficiency, unspecified: Secondary | ICD-10-CM | POA: Diagnosis not present

## 2023-06-01 DIAGNOSIS — E782 Mixed hyperlipidemia: Secondary | ICD-10-CM

## 2023-06-01 LAB — POCT GLYCOSYLATED HEMOGLOBIN (HGB A1C): Hemoglobin A1C: 5.5 % (ref 4.0–5.6)

## 2023-06-01 MED ORDER — ATENOLOL 25 MG PO TABS
25.0000 mg | ORAL_TABLET | Freq: Two times a day (BID) | ORAL | 3 refills | Status: DC
Start: 2023-06-01 — End: 2024-05-30

## 2023-06-02 ENCOUNTER — Other Ambulatory Visit: Payer: Self-pay | Admitting: Family Medicine

## 2023-06-02 DIAGNOSIS — E782 Mixed hyperlipidemia: Secondary | ICD-10-CM

## 2023-06-02 LAB — CBC WITH DIFFERENTIAL/PLATELET
Basophils Absolute: 0 10*3/uL (ref 0.0–0.2)
Basos: 1 %
EOS (ABSOLUTE): 0.1 10*3/uL (ref 0.0–0.4)
Eos: 1 %
Hematocrit: 41.9 % (ref 34.0–46.6)
Hemoglobin: 14.1 g/dL (ref 11.1–15.9)
Immature Grans (Abs): 0 10*3/uL (ref 0.0–0.1)
Immature Granulocytes: 0 %
Lymphocytes Absolute: 1.2 10*3/uL (ref 0.7–3.1)
Lymphs: 25 %
MCH: 31.9 pg (ref 26.6–33.0)
MCHC: 33.7 g/dL (ref 31.5–35.7)
MCV: 95 fL (ref 79–97)
Monocytes Absolute: 0.5 10*3/uL (ref 0.1–0.9)
Monocytes: 11 %
Neutrophils Absolute: 2.8 10*3/uL (ref 1.4–7.0)
Neutrophils: 62 %
Platelets: 267 10*3/uL (ref 150–450)
RBC: 4.42 x10E6/uL (ref 3.77–5.28)
RDW: 13.2 % (ref 11.7–15.4)
WBC: 4.5 10*3/uL (ref 3.4–10.8)

## 2023-06-02 LAB — CMP14+EGFR
ALT: 12 IU/L (ref 0–32)
AST: 16 IU/L (ref 0–40)
Albumin: 4.6 g/dL (ref 3.9–4.9)
Alkaline Phosphatase: 76 IU/L (ref 44–121)
BUN/Creatinine Ratio: 9 — ABNORMAL LOW (ref 12–28)
BUN: 7 mg/dL — ABNORMAL LOW (ref 8–27)
Bilirubin Total: 0.6 mg/dL (ref 0.0–1.2)
CO2: 24 mmol/L (ref 20–29)
Calcium: 9.9 mg/dL (ref 8.7–10.3)
Chloride: 97 mmol/L (ref 96–106)
Creatinine, Ser: 0.75 mg/dL (ref 0.57–1.00)
Globulin, Total: 2.4 g/dL (ref 1.5–4.5)
Glucose: 97 mg/dL (ref 70–99)
Potassium: 4.6 mmol/L (ref 3.5–5.2)
Sodium: 134 mmol/L (ref 134–144)
Total Protein: 7 g/dL (ref 6.0–8.5)
eGFR: 87 mL/min/{1.73_m2} (ref >59)

## 2023-06-02 LAB — LIPID PANEL
Chol/HDL Ratio: 2.7 ratio (ref 0.0–4.4)
Cholesterol, Total: 172 mg/dL (ref 100–199)
HDL: 64 mg/dL (ref >39)
LDL Chol Calc (NIH): 93 mg/dL (ref 0–99)
Triglycerides: 82 mg/dL (ref 0–149)
VLDL Cholesterol Cal: 15 mg/dL (ref 5–40)

## 2023-06-02 LAB — TSH: TSH: 0.66 u[IU]/mL (ref 0.450–4.500)

## 2023-06-02 MED ORDER — ATORVASTATIN CALCIUM 40 MG PO TABS
40.0000 mg | ORAL_TABLET | Freq: Every day | ORAL | 3 refills | Status: DC
Start: 2023-06-02 — End: 2024-06-22

## 2023-06-06 ENCOUNTER — Ambulatory Visit: Payer: PPO | Attending: Hematology and Oncology

## 2023-06-06 VITALS — Wt 154.4 lb

## 2023-06-06 DIAGNOSIS — Z483 Aftercare following surgery for neoplasm: Secondary | ICD-10-CM | POA: Insufficient documentation

## 2023-06-06 NOTE — Therapy (Signed)
OUTPATIENT PHYSICAL THERAPY SOZO SCREENING NOTE   Patient Name: Carol Harris MRN: [id number] DOB:[birth year], 67 y.o., female Today's Date: 06/06/2023  PCP: Joselyn Arrow, MD REFERRING PROVIDER: Serena Croissant, MD   PT End of Session - 06/06/23 1006     Visit Number 13   # unchanged due to screen only   PT Start Time 1004    PT Stop Time 1008    PT Time Calculation (min) 4 min    Behavior During Therapy [facility] for tasks assessed/performed             Past Medical History:  Diagnosis Date   Anxiety    Colon polyp    GERD (gastroesophageal reflux disease)    HTN (hypertension), benign 09/05/2011   Hyperlipidemia, mixed 09/05/2011   Leukopenia 09/05/2011   Sleep apnea    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   no defecits   Past Surgical History:  Procedure Laterality Date   BREAST LUMPECTOMY WITH RADIOACTIVE SEED AND SENTINEL LYMPH NODE BIOPSY Left 06/25/2022   Procedure: LEFT BREAST LUMPECTOMY WITH RADIOACTIVE SEED AND SENTINEL LYMPH NODE BIOPSY;  Surgeon: Griselda Miner, MD;  Location: [facility];  Service: General;  Laterality: Left;   KNEE ARTHROSCOPY Right 2005 approx   torn meniscus   TUBAL LIGATION     twice (first failed)   uterine ablation     Patient Active Problem List   Diagnosis Date Noted   Vitamin D deficiency 05/31/2023   Impaired fasting glucose 05/31/2023   Malignant neoplasm of lower-outer quadrant of left breast of female, estrogen receptor positive (HCC) 06/16/2022   Genetic testing 06/16/2022   Restless leg syndrome 11/08/2018   Left ventricular hypertrophy 11/08/2018   Depression 11/03/2018   Family history of colon cancer in mother 11/03/2018   History of vitamin D deficiency 11/03/2018   Night terrors, adult 11/03/2018   History of CVA (cerebrovascular accident) 07/12/2014   Obstructive sleep apnea 01/08/2014   GERD (gastroesophageal reflux disease) 12/19/2013   Essential hypertension 09/05/2011   Hyperlipidemia, mixed  09/05/2011    REFERRING DIAG: left breast cancer at risk for lymphedema  THERAPY DIAG:  Aftercare following surgery for neoplasm  PERTINENT HISTORY: 06/25/2022: Left lumpectomy: Grade 2 IDC 1.7 cm, margins negative, 0/5 sentinel nodes negative ER 95%, PR 80%, HER2 negative, Ki-67 15%  Adjuvant radiation completed 09/14/2022 No chemo, but is on letrozole, pt did not tolerate anastrozole   PRECAUTIONS: left UE Lymphedema risk, None  SUBJECTIVE: Pt returns for her 3 month L-Dex screen.   PAIN:  Are you having pain? No  SOZO SCREENING: Patient was assessed today using the SOZO machine to determine the lymphedema index score. This was compared to her baseline score. It was determined that she is within the recommended range when compared to her baseline and no further action is needed at this time. She will continue SOZO screenings. These are done every 3 months for 2 years post operatively followed by every 6 months for 2 years, and then annually.   L-DEX FLOWSHEETS - 06/06/23 1000       L-DEX LYMPHEDEMA SCREENING   Measurement Type Unilateral    L-DEX MEASUREMENT EXTREMITY Upper Extremity    POSITION  Standing    DOMINANT SIDE Right    At Risk Side Left    BASELINE SCORE (UNILATERAL) 1.2    L-DEX SCORE (UNILATERAL) 6.3    VALUE CHANGE (UNILAT) 5.1  Hermenia Bers, PTA 06/06/2023, 10:10 AM

## 2023-06-21 ENCOUNTER — Inpatient Hospital Stay: Payer: PPO | Attending: Hematology and Oncology | Admitting: Hematology and Oncology

## 2023-06-21 VITALS — BP 127/82 | HR 61 | Temp 97.7°F | Resp 18 | Ht 64.0 in | Wt 153.5 lb

## 2023-06-21 DIAGNOSIS — Z79899 Other long term (current) drug therapy: Secondary | ICD-10-CM | POA: Insufficient documentation

## 2023-06-21 DIAGNOSIS — C50512 Malignant neoplasm of lower-outer quadrant of left female breast: Secondary | ICD-10-CM | POA: Insufficient documentation

## 2023-06-21 DIAGNOSIS — Z79811 Long term (current) use of aromatase inhibitors: Secondary | ICD-10-CM | POA: Insufficient documentation

## 2023-06-21 DIAGNOSIS — Z170 Estrogen receptor positive status [ER+]: Secondary | ICD-10-CM | POA: Diagnosis not present

## 2023-06-21 DIAGNOSIS — Z923 Personal history of irradiation: Secondary | ICD-10-CM | POA: Insufficient documentation

## 2023-06-21 DIAGNOSIS — K5903 Drug induced constipation: Secondary | ICD-10-CM | POA: Diagnosis not present

## 2023-06-21 DIAGNOSIS — N6325 Unspecified lump in the left breast, overlapping quadrants: Secondary | ICD-10-CM | POA: Diagnosis not present

## 2023-06-21 DIAGNOSIS — T451X5A Adverse effect of antineoplastic and immunosuppressive drugs, initial encounter: Secondary | ICD-10-CM | POA: Diagnosis not present

## 2023-06-21 NOTE — Assessment & Plan Note (Addendum)
Screening mammogram detected left breast mass at 3 o'clock position 0.4 cm, axilla negative, ultrasound-guided biopsy revealed grade 1 IDC with lobular features with DCIS and calcifications ER 95% PR 80% HER2 negative, Ki-67 15% 06/25/2022: Left lumpectomy: Grade 2 IDC 1.7 cm, margins negative, 0/5 sentinel nodes negative ER 95%, PR 80%, HER2 negative, Ki-67 15% Oncotype DX score: 22: 8% risk of distant recurrence Adjuvant radiation completed 09/14/2022   Treatment plan: Adjuvant antiestrogen therapy with anastrozole 1 mg daily started 09/17/2022 discontinued 09/29/2022 (severe jaw pain), switched to letrozole   Letrozole toxicities: Constipation: Patient had it previously.   Denies any jaw pain or bone pain.   Breast cancer surveillance: Breast exam 10/15/2022: Benign Mammogram and ultrasound 05/24/2023: Benign postsurgical changes Her husband is going through chemotherapy for prostate cancer.  Is getting it with Dr. Myrle Sheng  Return to clinic in 1 year for follow-up

## 2023-06-21 NOTE — Progress Notes (Signed)
Patient Care Team: Joselyn Arrow, MD as PCP - General (Family Medicine) Griselda Miner, MD as Consulting Physician (General Surgery) Serena Croissant, MD as Consulting Physician (Hematology and Oncology) Dorothy Puffer, MD as Consulting Physician (Radiation Oncology) Blima Ledger, OD (Optometry) Noland Fordyce, MD as Consulting Physician (Obstetrics and Gynecology) Cherlyn Roberts, MD as Referring Physician (Dermatology)  DIAGNOSIS:  Encounter Diagnosis  Name Primary?   Malignant neoplasm of lower-outer quadrant of left breast of female, estrogen receptor positive (HCC) Yes    SUMMARY OF ONCOLOGIC HISTORY: Oncology History  Malignant neoplasm of lower-outer quadrant of left breast of female, estrogen receptor positive (HCC)  06/16/2022 Initial Diagnosis   Screening mammogram detected left breast mass at 3 o'clock position 0.4 cm, axilla negative, ultrasound-guided biopsy revealed grade 1 IDC with lobular features with DCIS and calcifications ER 95% PR 80% HER2 negative, Ki-67 15%   06/16/2022 Cancer Staging   Staging form: Breast, AJCC 8th Edition - Clinical: Stage IA (cT1a, cN0, cM0, G1, ER+, PR+, HER2-) - Signed by Serena Croissant, MD on 06/16/2022 Histologic grading system: 3 grade system    Genetic Testing   [facility] Multi-Cancer Panel was Negative. Of note, a variant of uncertain significance was detected in the [facility] gene. Report date is 03/16/2019.  The Multi-Cancer Panel offered by [employer] includes sequencing and/or deletion/duplication analysis of the following 84 genes:  AIP, ALK, APC, ATM, AXIN2, BAP1, BARD1, BLM, BMPR1A, BRCA1, BRCA2, BRIP1, CASR, CDC73, CDH1, CDK4, CDKN1B, CDKN1C, CDKN2A, CEBPA, CHEK2, CTNNA1, DICER1, DIS3L2, EGFR, EPCAM, FH, FLCN, GATA2, GPC3, GREM1, HOXB13, HRAS, KIT, MAX, MEN1, MET, MITF, MLH1, MSH2, MSH3, MSH6, MUTYH, NBN, NF1, NF2, NTHL1, PALB2, PDGFRA, PHOX2B, PMS2, POLD1, POLE, POT1, PRKAR1A, PTCH1, PTEN, RAD50, RAD51C, RAD51D, RB1, RECQL4, RET, RUNX1, SDHA,  SDHAF2, SDHB, SDHC, SDHD, SMAD4, SMARCA4, SMARCB1, SMARCE1, STK11, SUFU, TERC, TERT, TMEM127, TP53, TSC1, TSC2, VHL, WRN, and WT1.        06/25/2022 Surgery   Left lumpectomy: Grade 2 IDC 1.7 cm, margins negative, 0/5 sentinel nodes negative ER 95%, PR 80%, HER2 negative, Ki-67 15%    06/25/2022 Oncotype testing   Oncotype DX score: 22: 8% risk of distant recurrence   08/17/2022 - 09/14/2022 Radiation Therapy   Site Technique Total Dose (Gy) Dose per Fx (Gy) Completed Fx Beam Energies  Breast, Left: Breast_L 3D 42.56/42.56 2.66 16/16 6XFFF  Breast, Left: Breast_L_Bst 3D 8/8 2 4/4 6X     11/2022 -  Anti-estrogen oral therapy   Letrozole     CHIEF COMPLIANT: Follow-up letrozole   INTERVAL HISTORY: Carol Harris is a  67 y.o. with the above-mentioned left breast cancer. Currently on letrozole. She presents to the clinic for a follow-up. Patient reports that she is tolerating the letrozole extremely well. She denies jaw pain and does have some mild constipation.   ALLERGIES:  has No Known Allergies.  MEDICATIONS:  Current Outpatient Medications  Medication Sig Dispense Refill   aspirin EC 81 MG tablet Take 81 mg by mouth daily.     atenolol (TENORMIN) 25 MG tablet Take 1 tablet (25 mg total) by mouth 2 (two) times daily. 180 tablet 3   atorvastatin (LIPITOR) 40 MG tablet Take 1 tablet (40 mg total) by mouth daily. 90 tablet 3   buPROPion (WELLBUTRIN XL) 150 MG 24 hr tablet Take 450 mg by mouth every morning.     Calcium Carbonate (CALCIUM 500 PO) Take 1 tablet by mouth daily.     Cholecalciferol (VITAMIN D-3) 125 MCG (5000 UT) TABS Take 1  tablet by mouth 4 (four) times a week.     cyanocobalamin 100 MCG tablet Take 1,000 mcg by mouth daily.     letrozole (FEMARA) 2.5 MG tablet Take 1 tablet (2.5 mg total) by mouth daily. 90 tablet 3   losartan (COZAAR) 50 MG tablet TAKE 1 TABLET BY MOUTH EVERY DAY 90 tablet 0   NON FORMULARY Take 1 capsule by mouth once a week.     No current  facility-administered medications for this visit.    PHYSICAL EXAMINATION: ECOG PERFORMANCE STATUS: 1 - Symptomatic but completely ambulatory  Vitals:   06/21/23 1000  BP: 127/82  Pulse: 61  Resp: 18  Temp: 97.7 F (36.5 C)  SpO2: 100%   Filed Weights   06/21/23 1000  Weight: 153 lb 8 oz (69.6 kg)      LABORATORY DATA:  I have reviewed the data as listed    Latest Ref Rng & Units 06/01/2023   10:50 AM 06/16/2022    8:28 AM 05/20/2022    2:33 PM  CMP  Glucose 70 - 99 mg/dL 97  94  92   BUN 8 - 27 mg/dL 7  10  8    Creatinine 0.57 - 1.00 mg/dL 6.29  5.28  4.13   Sodium 134 - 144 mmol/L 134  135  133   Potassium 3.5 - 5.2 mmol/L 4.6  3.9  4.3   Chloride 96 - 106 mmol/L 97  101  94   CO2 20 - 29 mmol/L 24  28  24    Calcium 8.7 - 10.3 mg/dL 9.9  9.7  9.9   Total Protein 6.0 - 8.5 g/dL 7.0  7.6  8.1   Total Bilirubin 0.0 - 1.2 mg/dL 0.6  0.7  0.6   Alkaline Phos 44 - 121 IU/L 76  63  82   AST 0 - 40 IU/L 16  17  21    ALT 0 - 32 IU/L 12  17  22      Lab Results  Component Value Date   WBC 4.5 06/01/2023   HGB 14.1 06/01/2023   HCT 41.9 06/01/2023   MCV 95 06/01/2023   PLT 267 06/01/2023   NEUTROABS 2.8 06/01/2023    ASSESSMENT & PLAN:  Malignant neoplasm of lower-outer quadrant of left breast of female, estrogen receptor positive (HCC) Screening mammogram detected left breast mass at 3 o'clock position 0.4 cm, axilla negative, ultrasound-guided biopsy revealed grade 1 IDC with lobular features with DCIS and calcifications ER 95% PR 80% HER2 negative, Ki-67 15% 06/25/2022: Left lumpectomy: Grade 2 IDC 1.7 cm, margins negative, 0/5 sentinel nodes negative ER 95%, PR 80%, HER2 negative, Ki-67 15% Oncotype DX score: 22: 8% risk of distant recurrence Adjuvant radiation completed 09/14/2022   Treatment plan: Adjuvant antiestrogen therapy with anastrozole 1 mg daily started 09/17/2022 discontinued 09/29/2022 (severe jaw pain), switched to letrozole   Letrozole  toxicities: Constipation: Patient had it previously.   Denies any jaw pain or bone pain.   Breast cancer surveillance: Breast exam 10/15/2022: Benign Mammogram and ultrasound 05/24/2023: Benign postsurgical changes Her husband is going through chemotherapy for prostate cancer.  Is getting it with Dr. Myrle Sheng  Return to clinic in 1 year for follow-up   No orders of the defined types were placed in this encounter.  The patient has a good understanding of the overall plan. she agrees with it. she will call with any problems that may develop before the next visit here. Total time spent: 30 mins including face  to face time and time spent for planning, charting and co-ordination of care   Tamsen Meek, MD 06/21/23    I Janan Ridge am acting as a Neurosurgeon for Dr.Hye Trawick  I have reviewed the above documentation for accuracy and completeness, and I agree with the above.

## 2023-06-22 ENCOUNTER — Other Ambulatory Visit (HOSPITAL_BASED_OUTPATIENT_CLINIC_OR_DEPARTMENT_OTHER): Payer: Self-pay

## 2023-06-22 MED ORDER — FLUAD 0.5 ML IM SUSY
0.5000 mL | PREFILLED_SYRINGE | Freq: Once | INTRAMUSCULAR | 0 refills | Status: AC
  Filled 2023-06-22: qty 0.5, 1d supply, fill #0

## 2023-06-24 DIAGNOSIS — G4733 Obstructive sleep apnea (adult) (pediatric): Secondary | ICD-10-CM | POA: Diagnosis not present

## 2023-07-14 DIAGNOSIS — C50912 Malignant neoplasm of unspecified site of left female breast: Secondary | ICD-10-CM | POA: Diagnosis not present

## 2023-07-18 ENCOUNTER — Other Ambulatory Visit (HOSPITAL_BASED_OUTPATIENT_CLINIC_OR_DEPARTMENT_OTHER): Payer: Self-pay

## 2023-07-18 MED ORDER — COVID-19 MRNA VAC-TRIS(PFIZER) 30 MCG/0.3ML IM SUSY
0.3000 mL | PREFILLED_SYRINGE | Freq: Once | INTRAMUSCULAR | 0 refills | Status: AC
  Filled 2023-07-18: qty 0.3, 1d supply, fill #0

## 2023-07-20 NOTE — Progress Notes (Unsigned)
No chief complaint on file.    Recenty lost husband Has h/o Depression and anxiety:  She is taking Wellbutrin, and sees psych regularly. Wellbutrin dose was increased to 450mg  last year when had increased stressors, related to her daughter's mental illness and husband's metastatic prostate CA (mets to bone).  Stressors last year included daughter with mental illness, husband has metastatic prostate cancer. His cancer metastasized to the bone/spine.   Arthralgia and bone pain are SE of letrozole   PMH, PSH, SH reviewed   ROS:    PHYSICAL EXAM:  There were no vitals taken for this visit.      ASSESSMENT/PLAN:   Phq9 if seems depressed

## 2023-07-21 ENCOUNTER — Encounter: Payer: Self-pay | Admitting: Family Medicine

## 2023-07-21 ENCOUNTER — Ambulatory Visit: Payer: PPO | Admitting: Family Medicine

## 2023-07-21 VITALS — BP 148/86 | HR 88 | Ht 64.0 in | Wt 152.6 lb

## 2023-07-21 DIAGNOSIS — I10 Essential (primary) hypertension: Secondary | ICD-10-CM | POA: Diagnosis not present

## 2023-07-21 DIAGNOSIS — F32A Depression, unspecified: Secondary | ICD-10-CM | POA: Diagnosis not present

## 2023-07-21 DIAGNOSIS — Z5181 Encounter for therapeutic drug level monitoring: Secondary | ICD-10-CM | POA: Diagnosis not present

## 2023-07-21 DIAGNOSIS — F4321 Adjustment disorder with depressed mood: Secondary | ICD-10-CM

## 2023-07-21 DIAGNOSIS — R531 Weakness: Secondary | ICD-10-CM

## 2023-07-21 NOTE — Patient Instructions (Addendum)
Look into grief counseling through [facility] (formerly [facility]  Consider therapy and/or following up with your psychiatrist if worsening anxiety.  Pay attention to your blood pressure and pulse. If the pulse is consistently 50 or lower then we should adjust the atenolol. BP was elevated today, but sounds like it has been running fine at home.  Continue to periodically monitor. Goal is <130/80. Continue low sodium diet.

## 2023-07-22 LAB — CBC WITH DIFFERENTIAL/PLATELET
Basophils Absolute: 0 10*3/uL (ref 0.0–0.2)
Basos: 1 %
EOS (ABSOLUTE): 0.1 10*3/uL (ref 0.0–0.4)
Eos: 2 %
Hematocrit: 40.1 % (ref 34.0–46.6)
Hemoglobin: 13.7 g/dL (ref 11.1–15.9)
Immature Grans (Abs): 0 10*3/uL (ref 0.0–0.1)
Immature Granulocytes: 0 %
Lymphocytes Absolute: 0.9 10*3/uL (ref 0.7–3.1)
Lymphs: 25 %
MCH: 32.5 pg (ref 26.6–33.0)
MCHC: 34.2 g/dL (ref 31.5–35.7)
MCV: 95 fL (ref 79–97)
Monocytes Absolute: 0.4 10*3/uL (ref 0.1–0.9)
Monocytes: 12 %
Neutrophils Absolute: 2.3 10*3/uL (ref 1.4–7.0)
Neutrophils: 60 %
Platelets: 228 10*3/uL (ref 150–450)
RBC: 4.22 x10E6/uL (ref 3.77–5.28)
RDW: 12.8 % (ref 11.7–15.4)
WBC: 3.7 10*3/uL (ref 3.4–10.8)

## 2023-07-22 LAB — COMPREHENSIVE METABOLIC PANEL
ALT: 20 [IU]/L (ref 0–32)
AST: 21 [IU]/L (ref 0–40)
Albumin: 4.3 g/dL (ref 3.9–4.9)
Alkaline Phosphatase: 72 [IU]/L (ref 44–121)
BUN/Creatinine Ratio: 13 (ref 12–28)
BUN: 10 mg/dL (ref 8–27)
Bilirubin Total: 0.2 mg/dL (ref 0.0–1.2)
CO2: 21 mmol/L (ref 20–29)
Calcium: 9.6 mg/dL (ref 8.7–10.3)
Chloride: 99 mmol/L (ref 96–106)
Creatinine, Ser: 0.75 mg/dL (ref 0.57–1.00)
Globulin, Total: 2.6 g/dL (ref 1.5–4.5)
Glucose: 100 mg/dL — ABNORMAL HIGH (ref 70–99)
Potassium: 4.2 mmol/L (ref 3.5–5.2)
Sodium: 135 mmol/L (ref 134–144)
Total Protein: 6.9 g/dL (ref 6.0–8.5)
eGFR: 86 mL/min/{1.73_m2} (ref >59)

## 2023-07-24 DIAGNOSIS — G4733 Obstructive sleep apnea (adult) (pediatric): Secondary | ICD-10-CM | POA: Diagnosis not present

## 2023-07-29 ENCOUNTER — Other Ambulatory Visit: Payer: Self-pay | Admitting: Family Medicine

## 2023-07-29 DIAGNOSIS — I10 Essential (primary) hypertension: Secondary | ICD-10-CM

## 2023-08-11 DIAGNOSIS — Z634 Disappearance and death of family member: Secondary | ICD-10-CM | POA: Diagnosis not present

## 2023-08-11 DIAGNOSIS — F4320 Adjustment disorder, unspecified: Secondary | ICD-10-CM | POA: Diagnosis not present

## 2023-08-22 DIAGNOSIS — G4733 Obstructive sleep apnea (adult) (pediatric): Secondary | ICD-10-CM | POA: Diagnosis not present

## 2023-08-23 DIAGNOSIS — F332 Major depressive disorder, recurrent severe without psychotic features: Secondary | ICD-10-CM | POA: Diagnosis not present

## 2023-08-23 DIAGNOSIS — F411 Generalized anxiety disorder: Secondary | ICD-10-CM | POA: Diagnosis not present

## 2023-08-23 DIAGNOSIS — F900 Attention-deficit hyperactivity disorder, predominantly inattentive type: Secondary | ICD-10-CM | POA: Diagnosis not present

## 2023-08-24 DIAGNOSIS — G4733 Obstructive sleep apnea (adult) (pediatric): Secondary | ICD-10-CM | POA: Diagnosis not present

## 2023-09-05 ENCOUNTER — Other Ambulatory Visit (HOSPITAL_BASED_OUTPATIENT_CLINIC_OR_DEPARTMENT_OTHER): Payer: Self-pay

## 2023-09-05 ENCOUNTER — Ambulatory Visit: Payer: PPO | Attending: Hematology and Oncology

## 2023-09-05 VITALS — Wt 151.5 lb

## 2023-09-05 DIAGNOSIS — Z483 Aftercare following surgery for neoplasm: Secondary | ICD-10-CM | POA: Insufficient documentation

## 2023-09-05 MED ORDER — BOOSTRIX 5-2.5-18.5 LF-MCG/0.5 IM SUSY
PREFILLED_SYRINGE | INTRAMUSCULAR | 0 refills | Status: DC
  Filled 2023-09-05: qty 0.5, 1d supply, fill #0

## 2023-09-05 NOTE — Therapy (Signed)
OUTPATIENT PHYSICAL THERAPY SOZO SCREENING NOTE   Patient Name: Carol Harris MRN: [id number] DOB:1955/11/19, 67 y.o., female Today's Date: 09/05/2023  PCP: Joselyn Arrow, MD REFERRING PROVIDER: Serena Croissant, MD   PT End of Session - 09/05/23 1003     Visit Number 13   # unchanged due to screen only   PT Start Time 1001    PT Stop Time 1005    PT Time Calculation (min) 4 min    Activity Tolerance Patient tolerated treatment well    Behavior During Therapy [facility] for tasks assessed/performed             Past Medical History:  Diagnosis Date   Anxiety    Colon polyp    GERD (gastroesophageal reflux disease)    HTN (hypertension), benign 09/05/2011   Hyperlipidemia, mixed 09/05/2011   Leukopenia 09/05/2011   Sleep apnea    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   no defecits   Past Surgical History:  Procedure Laterality Date   BREAST LUMPECTOMY WITH RADIOACTIVE SEED AND SENTINEL LYMPH NODE BIOPSY Left 06/25/2022   Procedure: LEFT BREAST LUMPECTOMY WITH RADIOACTIVE SEED AND SENTINEL LYMPH NODE BIOPSY;  Surgeon: Griselda Miner, MD;  Location: [facility];  Service: General;  Laterality: Left;   KNEE ARTHROSCOPY Right 2005 approx   torn meniscus   TUBAL LIGATION     twice (first failed)   uterine ablation     Patient Active Problem List   Diagnosis Date Noted   Vitamin D deficiency 05/31/2023   Impaired fasting glucose 05/31/2023   Malignant neoplasm of lower-outer quadrant of left breast of female, estrogen receptor positive (HCC) 06/16/2022   Genetic testing 06/16/2022   Restless leg syndrome 11/08/2018   Left ventricular hypertrophy 11/08/2018   Depression 11/03/2018   Family history of colon cancer in mother 11/03/2018   History of vitamin D deficiency 11/03/2018   Night terrors, adult 11/03/2018   History of CVA (cerebrovascular accident) 07/12/2014   Obstructive sleep apnea 01/08/2014   GERD (gastroesophageal reflux disease) 12/19/2013    Essential hypertension 09/05/2011   Hyperlipidemia, mixed 09/05/2011    REFERRING DIAG: left breast cancer at risk for lymphedema  THERAPY DIAG:  Aftercare following surgery for neoplasm  PERTINENT HISTORY: 06/25/2022: Left lumpectomy: Grade 2 IDC 1.7 cm, margins negative, 0/5 sentinel nodes negative ER 95%, PR 80%, HER2 negative, Ki-67 15%  Adjuvant radiation completed 09/14/2022 No chemo, but is on letrozole, pt did not tolerate anastrozole   PRECAUTIONS: left UE Lymphedema risk, None  SUBJECTIVE: Pt returns for her 3 month L-Dex screen. "I lost my husband last month. He had been sick for a while."  PAIN:  Are you having pain? No  SOZO SCREENING: Patient was assessed today using the SOZO machine to determine the lymphedema index score. This was compared to her baseline score. It was determined that she is within the recommended range when compared to her baseline and no further action is needed at this time. She will continue SOZO screenings. These are done every 3 months for 2 years post operatively followed by every 6 months for 2 years, and then annually.   L-DEX FLOWSHEETS - 09/05/23 1000       L-DEX LYMPHEDEMA SCREENING   Measurement Type Unilateral    L-DEX MEASUREMENT EXTREMITY Upper Extremity    POSITION  Standing    DOMINANT SIDE Right    At Risk Side Left    BASELINE SCORE (UNILATERAL) 1.2    L-DEX SCORE (UNILATERAL) 3.1  VALUE CHANGE (UNILAT) 1.9               Hermenia Bers, PTA 09/05/2023, 10:04 AM

## 2023-09-23 DIAGNOSIS — G4733 Obstructive sleep apnea (adult) (pediatric): Secondary | ICD-10-CM | POA: Diagnosis not present

## 2023-09-28 DIAGNOSIS — F332 Major depressive disorder, recurrent severe without psychotic features: Secondary | ICD-10-CM | POA: Diagnosis not present

## 2023-09-28 DIAGNOSIS — F900 Attention-deficit hyperactivity disorder, predominantly inattentive type: Secondary | ICD-10-CM | POA: Diagnosis not present

## 2023-09-28 DIAGNOSIS — F411 Generalized anxiety disorder: Secondary | ICD-10-CM | POA: Diagnosis not present

## 2023-10-11 DIAGNOSIS — Z634 Disappearance and death of family member: Secondary | ICD-10-CM | POA: Diagnosis not present

## 2023-10-11 DIAGNOSIS — F4320 Adjustment disorder, unspecified: Secondary | ICD-10-CM | POA: Diagnosis not present

## 2023-10-12 DIAGNOSIS — G4733 Obstructive sleep apnea (adult) (pediatric): Secondary | ICD-10-CM | POA: Diagnosis not present

## 2023-10-13 DIAGNOSIS — C50912 Malignant neoplasm of unspecified site of left female breast: Secondary | ICD-10-CM | POA: Diagnosis not present

## 2023-10-24 DIAGNOSIS — G4733 Obstructive sleep apnea (adult) (pediatric): Secondary | ICD-10-CM | POA: Diagnosis not present

## 2023-10-26 ENCOUNTER — Ambulatory Visit: Payer: PPO | Admitting: Family Medicine

## 2023-10-26 ENCOUNTER — Encounter: Payer: Self-pay | Admitting: Family Medicine

## 2023-10-26 VITALS — BP 130/80 | HR 64 | Temp 99.0°F | Ht 64.0 in | Wt 148.4 lb

## 2023-10-26 DIAGNOSIS — J069 Acute upper respiratory infection, unspecified: Secondary | ICD-10-CM | POA: Diagnosis not present

## 2023-10-26 DIAGNOSIS — R509 Fever, unspecified: Secondary | ICD-10-CM

## 2023-10-26 DIAGNOSIS — R0981 Nasal congestion: Secondary | ICD-10-CM

## 2023-10-26 DIAGNOSIS — R6883 Chills (without fever): Secondary | ICD-10-CM

## 2023-10-26 LAB — POCT INFLUENZA A/B
Influenza A, POC: NEGATIVE
Influenza B, POC: NEGATIVE

## 2023-10-26 LAB — POC COVID19 BINAXNOW: SARS Coronavirus 2 Ag: NEGATIVE

## 2023-10-26 NOTE — Patient Instructions (Signed)
 Stay well hydrated. Drink plenty of water (with goal of very pale urine). Use the mucinex round the clock (twice daily if it is a 12 hour version). You can use the chlorpheniramine if it is helping. If the mucus is still thick, you might want to cut back on this (this really treats allergies more than a cold).  Please use sinus rinses once or twice daily. You can use with the [employer] sinus rinse kit, or a Neti-pot. Be sure to use boiled or distilled water (not tap water).  If you have persistent fever, or persistent/worsening discolored mucus or sinus pain, then you may be developing a sinus infection. Contact us  so that we can send in a prescription. Currently, it is affecting all of your sinsuses, so less likely due to a bacterial infection, and also the color is starting to improve, which is a good sign that this is a viral infection and may start to turn the corner and improve in the next few days.  Continue tylenol  as needed for pain or fever.

## 2023-10-26 NOTE — Progress Notes (Signed)
 Chief Complaint  Patient presents with   Facial Pain    Started last Monday with stuff nose. Lots of yellow mucus that started Friday, ST started also Friday. Did a home covid test last Monday. (Day symptoms started).Had fever last Monday. Also had chills.    9 days ago she started with chills, stuffy nose and headache. She had been around her granddaughter with a runny nose, and son-in-law with chills/URI symptoms. No other symptoms developed until Friday, when she started with thick, yellow nasal drainage and worsening sinus headaches. Pain is above and below eyes, bilaterally. It was bright yellow, has been getting a little lighter today. Denies cough (very slight). Denies chest congestion. Sore throat started today.  Using chloraseptic spray, Listermint gargles, which helped with sore throat. She has been taking chlorpheniramine TID, and also taking mucinex 12 hour just at night.  She felt like her fevers have resolved (not aware of LG fever today). No further chills.   PMH, PSH, SH reviewed  Outpatient Encounter Medications as of 10/26/2023  Medication Sig Note   aspirin  EC 81 MG tablet Take 81 mg by mouth daily.    atenolol  (TENORMIN ) 25 MG tablet Take 1 tablet (25 mg total) by mouth 2 (two) times daily.    atorvastatin  (LIPITOR) 40 MG tablet Take 1 tablet (40 mg total) by mouth daily.    buPROPion  (WELLBUTRIN  XL) 150 MG 24 hr tablet Take 450 mg by mouth every morning.    Calcium  Carbonate (CALCIUM  500 PO) Take 1 tablet by mouth daily. 06/01/2023: Plant based   chlorpheniramine (CHLOR-TRIMETON) 4 MG tablet Take 4 mg by mouth 2 (two) times daily as needed for allergies. 10/26/2023: Last dose 10am   Cholecalciferol  (VITAMIN D -3) 125 MCG (5000 UT) TABS Take 1 tablet by mouth 4 (four) times a week.    cyanocobalamin 100 MCG tablet Take 1,000 mcg by mouth daily.    guaiFENesin (MUCINEX) 600 MG 12 hr tablet Take 600 mg by mouth 2 (two) times daily. 10/26/2023: Last dose last night    letrozole  (FEMARA ) 2.5 MG tablet Take 1 tablet (2.5 mg total) by mouth daily.    losartan  (COZAAR ) 50 MG tablet TAKE 1 TABLET BY MOUTH EVERY DAY    NON FORMULARY Take 1 capsule by mouth once a week. (Patient not taking: Reported on 10/26/2023) 06/01/2023: Beautiful Legs   [DISCONTINUED] Tdap (BOOSTRIX ) 5-2.5-18.5 LF-MCG/0.5 injection Inject into the muscle.    No facility-administered encounter medications on file as of 10/26/2023.   No Known Allergies   ROS: URI symptoms per HPI. No n/v/d No cough, shortness of breath or chest pain. No bleeding, bruising, rashes     PHYSICAL EXAM:  BP 130/80   Pulse 64   Temp 99 F (37.2 C) (Tympanic)   Ht 5\' 4"  (1.626 m)   Wt 148 lb 6.4 oz (67.3 kg)   SpO2 99%   BMI 25.47 kg/m   Well-appearing female, mildly congestion, with some throat-clearing. Speaking comfortably during visit, in no distress HEENT: conjunctiva and sclera are clear, EOMI. TM's and EAC's normal. OP is clear.  Nasal mucosa --septal deviation with narrowing on the left.  There is erythematous mucosa bilaterally, with clear-white mucus present on the right. Mildly tender in sinuses x 4. Neck: no lymphadenopathy or mass Heart: regular rate and rhythm, no murmur Lungs: clear bilaterally Skin: normal turgor, no rash Psych: normal mood, affect, hygiene and grooming Neuro: alert and oriented, cranial nerves grossly intact, normal gait.   Influenza A&B negative COVID  negative  ASSESSMENT/PLAN:  Viral upper respiratory illness - discussed poss of early sinus infection--to contact us  for ABX if sx persist/worsen despite sinus rinses and mucinex BID in the next few days  Stuffy nose - Plan: POC COVID-19, Influenza A/B  Fever, unspecified fever cause - Plan: POC COVID-19, Influenza A/B  Chills - Plan: POC COVID-19, Influenza A/B  Stay well hydrated. Drink plenty of water (with goal of very pale urine). Use the mucinex round the clock (twice daily if it is a 12 hour  version). You can use the chlorpheniramine if it is helping. If the mucus is still thick, you might want to cut back on this (this really treats allergies more than a cold).  Please use sinus rinses once or twice daily. You can use with the [employer]Med sinus rinse kit, or a Neti-pot. Be sure to use boiled or distilled water (not tap water).  If you have persistent fever, or persistent/worsening discolored mucus or sinus pain, then you may be developing a sinus infection. Contact us  so that we can send in a prescription. Currently, it is affecting all of your sinsuses, so less likely due to a bacterial infection, and also the color is starting to improve, which is a good sign that this is a viral infection and may start to turn the corner and improve in the next few days.

## 2023-10-27 DIAGNOSIS — F4320 Adjustment disorder, unspecified: Secondary | ICD-10-CM | POA: Diagnosis not present

## 2023-10-27 DIAGNOSIS — Z634 Disappearance and death of family member: Secondary | ICD-10-CM | POA: Diagnosis not present

## 2023-11-04 ENCOUNTER — Other Ambulatory Visit: Payer: Self-pay | Admitting: Family Medicine

## 2023-11-04 DIAGNOSIS — I10 Essential (primary) hypertension: Secondary | ICD-10-CM

## 2023-11-08 DIAGNOSIS — Z634 Disappearance and death of family member: Secondary | ICD-10-CM | POA: Diagnosis not present

## 2023-11-08 DIAGNOSIS — F4320 Adjustment disorder, unspecified: Secondary | ICD-10-CM | POA: Diagnosis not present

## 2023-11-23 NOTE — Progress Notes (Unsigned)
No chief complaint on file.  Last seen in January for a viral illness. Prior to that she was seen in October to discuss episodes of weakness.  Per October visit: 10/5--she got out of the truck at [employer], felt dizzy, knees felt rubbery as she was walking. She sat down and rested for a bit and felt better, not completely fine, but a little better.  While on line inside, she had a hot flash and needed to sit down.  Having more hot flashes since on letrozole, but tolerable. She recalls that it wasn't too hot out that day.  Admits that she hasn't been eating very well, may have had a little something that day.  Felt better once she was home--sat, ate/drank.   Daughter reports this occurred multiple times--BP's were normal, 120's-130's/60, pulse 55-60, which is her normal. She is drained and tired from grieving, but is also having "weakness" episodes. Only felt LH the one time. Other times she just feels weak overall, mainly in the knees. She hasn't felt strong enough to walk with her friend. "Weak in the knees" feeling is constant. Denies joint pains.  She had normal CBC and c-met in October. Had low-normal TSH in August Lab Results  Component Value Date   TSH 0.660 06/01/2023     She had been sleeping only 6 hours.   She has OSA, and reports compliance with use of CPAP machine.     Depression and anxiety:  She is taking Wellbutrin, and sees psych regularly. Doesn't currently see a therapist. Wellbutrin dose was increased to 450mg  last year when had increased stressors, related to her daughter's mental illness and husband's metastatic prostate CA (mets to bone).      PMH, PSH, SH reviewed      PMH, PSH, SH reviewed   ROS:    PHYSICAL EXAM:  There were no vitals taken for this visit.      ASSESSMENT/PLAN:

## 2023-11-24 ENCOUNTER — Ambulatory Visit: Payer: PPO | Admitting: Family Medicine

## 2023-11-24 VITALS — BP 132/86 | HR 60 | Ht 64.0 in | Wt 146.4 lb

## 2023-11-24 DIAGNOSIS — F32A Depression, unspecified: Secondary | ICD-10-CM

## 2023-11-24 DIAGNOSIS — R531 Weakness: Secondary | ICD-10-CM

## 2023-11-24 DIAGNOSIS — I10 Essential (primary) hypertension: Secondary | ICD-10-CM

## 2023-11-24 DIAGNOSIS — R001 Bradycardia, unspecified: Secondary | ICD-10-CM

## 2023-11-24 DIAGNOSIS — R42 Dizziness and giddiness: Secondary | ICD-10-CM | POA: Diagnosis not present

## 2023-11-24 DIAGNOSIS — G4733 Obstructive sleep apnea (adult) (pediatric): Secondary | ICD-10-CM | POA: Diagnosis not present

## 2023-11-24 NOTE — Patient Instructions (Addendum)
We are checking EKG and labs today. Your exam was normal. Be sure to stay well hydrated. Dehydration and stress/anxiety can contribute to your symptoms.   We discussed doing a heart monitor to look for abnormal heart rhythm that might contribute to these episodes. Expect information about this next week.  If your tests are fine, we should mention that this could possibly be related to anxiety (?panic attack)

## 2023-11-25 ENCOUNTER — Encounter: Payer: Self-pay | Admitting: Family Medicine

## 2023-11-25 ENCOUNTER — Ambulatory Visit: Payer: PPO | Attending: Family Medicine

## 2023-11-25 DIAGNOSIS — R001 Bradycardia, unspecified: Secondary | ICD-10-CM

## 2023-11-25 DIAGNOSIS — R42 Dizziness and giddiness: Secondary | ICD-10-CM

## 2023-11-25 DIAGNOSIS — R531 Weakness: Secondary | ICD-10-CM

## 2023-11-25 LAB — COMPREHENSIVE METABOLIC PANEL
ALT: 11 IU/L (ref 0–32)
AST: 18 IU/L (ref 0–40)
Albumin: 4.4 g/dL (ref 3.9–4.9)
Alkaline Phosphatase: 84 IU/L (ref 44–121)
BUN/Creatinine Ratio: 12 (ref 12–28)
BUN: 9 mg/dL (ref 8–27)
Bilirubin Total: 0.3 mg/dL (ref 0.0–1.2)
CO2: 25 mmol/L (ref 20–29)
Calcium: 9.5 mg/dL (ref 8.7–10.3)
Chloride: 98 mmol/L (ref 96–106)
Creatinine, Ser: 0.74 mg/dL (ref 0.57–1.00)
Globulin, Total: 2.8 g/dL (ref 1.5–4.5)
Glucose: 89 mg/dL (ref 70–99)
Potassium: 4.4 mmol/L (ref 3.5–5.2)
Sodium: 136 mmol/L (ref 134–144)
Total Protein: 7.2 g/dL (ref 6.0–8.5)
eGFR: 88 mL/min/{1.73_m2} (ref >59)

## 2023-11-25 LAB — CBC WITH DIFFERENTIAL/PLATELET
Basophils Absolute: 0 10*3/uL (ref 0.0–0.2)
Basos: 1 %
EOS (ABSOLUTE): 0.1 10*3/uL (ref 0.0–0.4)
Eos: 1 %
Hematocrit: 41.7 % (ref 34.0–46.6)
Hemoglobin: 14 g/dL (ref 11.1–15.9)
Immature Grans (Abs): 0 10*3/uL (ref 0.0–0.1)
Immature Granulocytes: 0 %
Lymphocytes Absolute: 0.8 10*3/uL (ref 0.7–3.1)
Lymphs: 18 %
MCH: 31.7 pg (ref 26.6–33.0)
MCHC: 33.6 g/dL (ref 31.5–35.7)
MCV: 95 fL (ref 79–97)
Monocytes Absolute: 0.5 10*3/uL (ref 0.1–0.9)
Monocytes: 11 %
Neutrophils Absolute: 2.9 10*3/uL (ref 1.4–7.0)
Neutrophils: 69 %
Platelets: 237 10*3/uL (ref 150–450)
RBC: 4.41 x10E6/uL (ref 3.77–5.28)
RDW: 12.9 % (ref 11.7–15.4)
WBC: 4.2 10*3/uL (ref 3.4–10.8)

## 2023-11-28 ENCOUNTER — Other Ambulatory Visit: Payer: Self-pay | Admitting: Family Medicine

## 2023-11-28 DIAGNOSIS — R42 Dizziness and giddiness: Secondary | ICD-10-CM

## 2023-11-28 DIAGNOSIS — I10 Essential (primary) hypertension: Secondary | ICD-10-CM

## 2023-11-28 DIAGNOSIS — R531 Weakness: Secondary | ICD-10-CM

## 2023-11-28 DIAGNOSIS — R001 Bradycardia, unspecified: Secondary | ICD-10-CM

## 2023-11-28 NOTE — Progress Notes (Signed)
Patient is being seen right now to get monitor.

## 2023-11-28 NOTE — Progress Notes (Unsigned)
 EP to read.

## 2023-11-29 ENCOUNTER — Other Ambulatory Visit: Payer: Self-pay | Admitting: Hematology and Oncology

## 2023-11-29 DIAGNOSIS — Z634 Disappearance and death of family member: Secondary | ICD-10-CM | POA: Diagnosis not present

## 2023-11-29 DIAGNOSIS — F4320 Adjustment disorder, unspecified: Secondary | ICD-10-CM | POA: Diagnosis not present

## 2023-11-30 ENCOUNTER — Telehealth: Payer: Self-pay

## 2023-11-30 DIAGNOSIS — G4733 Obstructive sleep apnea (adult) (pediatric): Secondary | ICD-10-CM | POA: Diagnosis not present

## 2023-11-30 NOTE — Telephone Encounter (Signed)
Copied from CRM [id number]. Topic: General - Other >> Nov 30, 2023  1:49 PM Geroge Baseman wrote: Reason for CRM: Patient is calling in to see what she needs to do in regards to a heart monitor she was supposed to be getting. Please call to advise on next steps.

## 2023-11-30 NOTE — Telephone Encounter (Signed)
Veronica's message said she was getting her monitor on 2/17.  She did not get it?  It should be self explanatory once she gets it.  It is the [facility] monitor.  Did she not get the monitor?

## 2023-11-30 NOTE — Telephone Encounter (Signed)
Is the instruction and education handled by the company? Please advise.

## 2023-12-01 NOTE — Telephone Encounter (Signed)
Spoke with Cardiology and let patient know, Carol Harris should receive her monitor by [employer] by Friday, 12/02/23 9:00PM. Tracking # [id number].

## 2023-12-02 DIAGNOSIS — R42 Dizziness and giddiness: Secondary | ICD-10-CM | POA: Diagnosis not present

## 2023-12-02 DIAGNOSIS — R531 Weakness: Secondary | ICD-10-CM | POA: Diagnosis not present

## 2023-12-02 DIAGNOSIS — R001 Bradycardia, unspecified: Secondary | ICD-10-CM | POA: Diagnosis not present

## 2023-12-08 DIAGNOSIS — F332 Major depressive disorder, recurrent severe without psychotic features: Secondary | ICD-10-CM | POA: Diagnosis not present

## 2023-12-08 DIAGNOSIS — F411 Generalized anxiety disorder: Secondary | ICD-10-CM | POA: Diagnosis not present

## 2023-12-08 DIAGNOSIS — F900 Attention-deficit hyperactivity disorder, predominantly inattentive type: Secondary | ICD-10-CM | POA: Diagnosis not present

## 2023-12-12 ENCOUNTER — Ambulatory Visit: Payer: PPO | Attending: Hematology and Oncology

## 2023-12-12 VITALS — Wt 146.1 lb

## 2023-12-12 DIAGNOSIS — Z483 Aftercare following surgery for neoplasm: Secondary | ICD-10-CM | POA: Insufficient documentation

## 2023-12-12 NOTE — Therapy (Signed)
 OUTPATIENT PHYSICAL THERAPY SOZO SCREENING NOTE   Patient Name: Carol Harris MRN: [id number] DOB:[birth year], 68 y.o., female Today's Date: 12/12/2023  PCP: Joselyn Arrow, MD REFERRING PROVIDER: Serena Croissant, MD   PT End of Session - 12/12/23 [phone]     Visit Number 13   # unchanged due to screen only   PT Start Time 0928    PT Stop Time 0932    PT Time Calculation (min) 4 min    Activity Tolerance Patient tolerated treatment well    Behavior During Therapy [facility] for tasks assessed/performed             Past Medical History:  Diagnosis Date   Anxiety    Colon polyp    GERD (gastroesophageal reflux disease)    HTN (hypertension), benign 09/05/2011   Hyperlipidemia, mixed 09/05/2011   Leukopenia 09/05/2011   Sleep apnea    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   no defecits   Past Surgical History:  Procedure Laterality Date   BREAST LUMPECTOMY WITH RADIOACTIVE SEED AND SENTINEL LYMPH NODE BIOPSY Left 06/25/2022   Procedure: LEFT BREAST LUMPECTOMY WITH RADIOACTIVE SEED AND SENTINEL LYMPH NODE BIOPSY;  Surgeon: Griselda Miner, MD;  Location: [facility];  Service: General;  Laterality: Left;   KNEE ARTHROSCOPY Right 2005 approx   torn meniscus   TUBAL LIGATION     twice (first failed)   uterine ablation     Patient Active Problem List   Diagnosis Date Noted   Vitamin D deficiency 05/31/2023   Impaired fasting glucose 05/31/2023   Malignant neoplasm of lower-outer quadrant of left breast of female, estrogen receptor positive (HCC) 06/16/2022   Genetic testing 06/16/2022   Restless leg syndrome 11/08/2018   Left ventricular hypertrophy 11/08/2018   Depression 11/03/2018   Family history of colon cancer in mother 11/03/2018   History of vitamin D deficiency 11/03/2018   Night terrors, adult 11/03/2018   History of CVA (cerebrovascular accident) 07/12/2014   Obstructive sleep apnea 01/08/2014   GERD (gastroesophageal reflux disease) 12/19/2013    Essential hypertension 09/05/2011   Hyperlipidemia, mixed 09/05/2011    REFERRING DIAG: left breast cancer at risk for lymphedema  THERAPY DIAG:  Aftercare following surgery for neoplasm  PERTINENT HISTORY: 06/25/2022: Left lumpectomy: Grade 2 IDC 1.7 cm, margins negative, 0/5 sentinel nodes negative ER 95%, PR 80%, HER2 negative, Ki-67 15%  Adjuvant radiation completed 09/14/2022 No chemo, but is on letrozole, pt did not tolerate anastrozole   PRECAUTIONS: left UE Lymphedema risk, None  SUBJECTIVE: Pt returns for her 3 month L-Dex screen. "I've been doing ok since my husband passed in Sept. I've been packing a lot of boxes as I've been getting around to clearing some of his things out."  PAIN:  Are you having pain? No  SOZO SCREENING: Patient was assessed today using the SOZO machine to determine the lymphedema index score. This was compared to her baseline score. It was determined that she is within the recommended range when compared to her baseline and no further action is needed at this time. She will continue SOZO screenings. These are done every 3 months for 2 years post operatively followed by every 6 months for 2 years, and then annually.   L-DEX FLOWSHEETS - 12/12/23 0900       L-DEX LYMPHEDEMA SCREENING   Measurement Type Unilateral    L-DEX MEASUREMENT EXTREMITY Upper Extremity    POSITION  Standing    DOMINANT SIDE Right    At Risk  Side Left    BASELINE SCORE (UNILATERAL) 1.2    L-DEX SCORE (UNILATERAL) 2.9    VALUE CHANGE (UNILAT) 1.7               Hermenia Bers, PTA 12/12/2023, 9:32 AM

## 2023-12-27 DIAGNOSIS — R001 Bradycardia, unspecified: Secondary | ICD-10-CM | POA: Diagnosis not present

## 2023-12-27 DIAGNOSIS — R42 Dizziness and giddiness: Secondary | ICD-10-CM | POA: Diagnosis not present

## 2024-01-02 DIAGNOSIS — Z01411 Encounter for gynecological examination (general) (routine) with abnormal findings: Secondary | ICD-10-CM | POA: Diagnosis not present

## 2024-01-02 DIAGNOSIS — Z124 Encounter for screening for malignant neoplasm of cervix: Secondary | ICD-10-CM | POA: Diagnosis not present

## 2024-01-02 DIAGNOSIS — Z1331 Encounter for screening for depression: Secondary | ICD-10-CM | POA: Diagnosis not present

## 2024-01-02 DIAGNOSIS — Z01419 Encounter for gynecological examination (general) (routine) without abnormal findings: Secondary | ICD-10-CM | POA: Diagnosis not present

## 2024-01-03 DIAGNOSIS — Z634 Disappearance and death of family member: Secondary | ICD-10-CM | POA: Diagnosis not present

## 2024-01-03 DIAGNOSIS — F4320 Adjustment disorder, unspecified: Secondary | ICD-10-CM | POA: Diagnosis not present

## 2024-01-04 NOTE — Progress Notes (Unsigned)
 No chief complaint on file.  Patient presents to f/u on recent Zio heart monitor.  She was seen last month with episodes of weakness. She had normal c-met and CBC EKG notable for sinus bradycardia, rate of 55, otherwise normal.  Zio heart monitor Results: HR 46 - 188, average 66 bpm. 6 nonsustained SVT, longest 17 beats. Rhythm strip suggests AT. Rare supraventricular and ventricular ectopy. No atrial fibrillation. Symptom trigger episodes correspond to sinus rhythm +/- AT.   She has OSA, and reports compliance with use of CPAP machine.   Depression and anxiety:  She is taking Wellbutrin, and sees psych regularly.  Doesn't currently see a therapist, but getting grief counseling through hospice.    PMH, PSH, SH reviewed   ROS: No headaches (just very mild, intermittent), URI symptoms, f/c. No n/v/d, abdominal pain, urinary complaints. No back pain, edema. Moods are okay.    PHYSICAL EXAM:  There were no vitals taken for this visit.  Wt Readings from Last 3 Encounters:  12/12/23 146 lb 2 oz (66.3 kg)  11/24/23 146 lb 6.4 oz (66.4 kg)  10/26/23 148 lb 6.4 oz (67.3 kg)   Well-appearing, pleasant female, in no distress HEENT: conjunctiva and sclera are clear, EOMI.  No nasal drainage or sinus tenderness. OP clear.  Neck: no lymphadenopathy, thyromegaly or carotid bruit Heart: regular rate and rhythm, mild bradycardia. No murmur or ectopy noted. Lungs: clear bilaterally Back: no spinal or CVA tenderness Chest: nontender Abdomen: soft, nontender, no organomegaly or mass Extremities: no edema Psych: normal mood, affect, hygiene and grooming     ASSESSMENT/PLAN:   [facility] had wrong cell phone for her ([phone] instead of [phone]) documented in recent phone call. They left message on home phone as well. She should return their call to try and schedule colonoscopy at [facility] location, per review of messages.

## 2024-01-05 ENCOUNTER — Encounter: Payer: Self-pay | Admitting: Family Medicine

## 2024-01-05 ENCOUNTER — Ambulatory Visit (INDEPENDENT_AMBULATORY_CARE_PROVIDER_SITE_OTHER): Admitting: Family Medicine

## 2024-01-05 VITALS — BP 132/78 | HR 60 | Ht 64.0 in | Wt 146.6 lb

## 2024-01-05 DIAGNOSIS — R531 Weakness: Secondary | ICD-10-CM | POA: Diagnosis not present

## 2024-01-05 DIAGNOSIS — R001 Bradycardia, unspecified: Secondary | ICD-10-CM

## 2024-01-05 DIAGNOSIS — I10 Essential (primary) hypertension: Secondary | ICD-10-CM

## 2024-01-05 DIAGNOSIS — I471 Supraventricular tachycardia, unspecified: Secondary | ICD-10-CM

## 2024-01-05 NOTE — Patient Instructions (Addendum)
 We are referring you to a cardiologist for further evaluation of your weak spells. Continue to stay well hydrated, and ensure that you are eating well, not skipping meals. Continue try and keep track of your symptoms--keep a journal and bring this to the cardiologist. Check your blood pressure if you are home when these episodes occur, and your pulse.  Since you do have some particular stressors, consider counseling (if those needs aren't being adequately by the grief counseling).

## 2024-01-26 DIAGNOSIS — Z634 Disappearance and death of family member: Secondary | ICD-10-CM | POA: Diagnosis not present

## 2024-01-26 DIAGNOSIS — F4320 Adjustment disorder, unspecified: Secondary | ICD-10-CM | POA: Diagnosis not present

## 2024-03-06 DIAGNOSIS — F4320 Adjustment disorder, unspecified: Secondary | ICD-10-CM | POA: Diagnosis not present

## 2024-03-06 DIAGNOSIS — Z634 Disappearance and death of family member: Secondary | ICD-10-CM | POA: Diagnosis not present

## 2024-03-07 DIAGNOSIS — F331 Major depressive disorder, recurrent, moderate: Secondary | ICD-10-CM | POA: Diagnosis not present

## 2024-03-19 ENCOUNTER — Ambulatory Visit

## 2024-03-27 DIAGNOSIS — Z634 Disappearance and death of family member: Secondary | ICD-10-CM | POA: Diagnosis not present

## 2024-03-27 DIAGNOSIS — F4320 Adjustment disorder, unspecified: Secondary | ICD-10-CM | POA: Diagnosis not present

## 2024-03-30 ENCOUNTER — Ambulatory Visit: Payer: Self-pay

## 2024-03-30 NOTE — Telephone Encounter (Signed)
 FYI Only or Action Required?: FYI only for provider.  Patient was last seen in primary care on 01/05/2024 by Roosvelt Colla, MD. Called Nurse Triage reporting Rectal Bleeding. Symptoms began several days ago. Interventions attempted: Rest, hydration, or home remedies and Dietary changes, stool softener and fiber supplements. Symptoms are: constipation, rectal bleeding (bright red blood in toilet about 1ml) stable.  Triage Disposition: See PCP When Office is Open (Within 3 Days)  Patient/caregiver understands and will follow disposition?: No                              Copied from CRM [id number]. Topic: Clinical - Red Word Triage >> Mar 30, 2024 11:43 AM Leory Rands wrote: Red Word that prompted transfer to Nurse Triage: Patient is calling to report rectal bleeding for year or two. Patient reporting that it has gotten worse at time - more regular with constipation. Patient is reporting that she has hemmorids. GI doctor has retired a couple of years ago. In need of a new GI doctor. Please advise Reason for Disposition  MILD rectal bleeding (more than just a few drops or streaks)  Answer Assessment - Initial Assessment Questions Patient states her GI doctor has retired. She called her previous GI office at [facility] and she states they can not fit her in until the end of August (due to providers are unavailable). Patient asking for referral to new GI office. Patient is eating prunes, drinking prune juice, added stool softener and fiber as well as increased her fluids.   1. APPEARANCE of BLOOD: What color is it? Is it passed separately, on the surface of the stool, or mixed in with the stool?      Bright red blood with no stool.  2. AMOUNT: How much blood was passed?      1ml estimated.  3. FREQUENCY: How many times has blood been passed with the stools?      Once every couple of weeks or less frequent than that.  4. ONSET: When was the blood first seen in the  stools? (Days or weeks)      X 1-2 years. She states she noticed the other day there was bleeding unrelated to hard stool.  5. DIARRHEA: Is there also some diarrhea? If Yes, ask: How many diarrhea stools in the past 24 hours?      No.  6. CONSTIPATION: Do you have constipation? If Yes, ask: How bad is it?     Yes, she states the constipation is down to a stool every 2-3 days.  7. RECURRENT SYMPTOMS: Have you had blood in your stools before? If Yes, ask: When was the last time? and What happened that time?      Yes, last time was a couple weeks ago. She states she has had bleeding hemorrhoids banded before.  8. BLOOD THINNERS: Do you take any blood thinners? (e.g., Coumadin/warfarin, Pradaxa/dabigatran, aspirin )     Aspirin  81mg .  9. OTHER SYMPTOMS: Do you have any other symptoms?  (e.g., abdomen pain, vomiting, dizziness, fever)     Hemorrhoids. Patient denies abdominal pain, fever, nausea, vomiting, dizziness, lightheaded.  10. PREGNANCY: Is there any chance you are pregnant? When was your last menstrual period?       N/A.  Protocols used: Rectal Bleeding-A-AH

## 2024-04-04 NOTE — Progress Notes (Unsigned)
 No chief complaint on file.  Patient presents with complaint of rectal bleeding x 1 week. She has had constipation. Noted BRBPR, in toilet.  Last colonoscopy was 08/2021, which showed moderate internal hemorrhoids.   PMH, PSH, SH reviewed   ROS:  ?any SVT/palpitations?   PHYSICAL EXAM:  There were no vitals taken for this visit.  Wt Readings from Last 3 Encounters:  01/05/24 146 lb 9.6 oz (66.5 kg)  12/12/23 146 lb 2 oz (66.3 kg)  11/24/23 146 lb 6.4 oz (66.4 kg)      ASSESSMENT/PLAN:  Refer for banding?--[place]uster, Shila Eloy Federico [place]  She never read last MyChart message about [facility] having the wrong number. Looks like she has appt with [facility] 8/22  Cardiology visit 8/18

## 2024-04-05 ENCOUNTER — Encounter: Payer: Self-pay | Admitting: Family Medicine

## 2024-04-05 ENCOUNTER — Ambulatory Visit (INDEPENDENT_AMBULATORY_CARE_PROVIDER_SITE_OTHER): Admitting: Family Medicine

## 2024-04-05 VITALS — BP 128/82 | HR 64 | Ht 64.0 in | Wt 144.2 lb

## 2024-04-05 DIAGNOSIS — K648 Other hemorrhoids: Secondary | ICD-10-CM

## 2024-04-05 DIAGNOSIS — K5900 Constipation, unspecified: Secondary | ICD-10-CM

## 2024-04-05 DIAGNOSIS — K625 Hemorrhage of anus and rectum: Secondary | ICD-10-CM

## 2024-04-05 DIAGNOSIS — Z800 Family history of malignant neoplasm of digestive organs: Secondary | ICD-10-CM

## 2024-04-05 DIAGNOSIS — I471 Supraventricular tachycardia, unspecified: Secondary | ICD-10-CM

## 2024-04-05 DIAGNOSIS — F4321 Adjustment disorder with depressed mood: Secondary | ICD-10-CM

## 2024-04-05 MED ORDER — HYDROCORTISONE ACETATE 25 MG RE SUPP
25.0000 mg | Freq: Two times a day (BID) | RECTAL | 1 refills | Status: DC
Start: 2024-04-05 — End: 2024-05-29

## 2024-04-05 NOTE — Patient Instructions (Signed)
 Stay well hydrated. Continue to try and eat a high fiber diet, including your prunes and prune juice. I would recommend using Miralax daily/regularly, rather than just when needed for constipation.  You can use a full cap daily, and adjust the dose or frequency based on the consistency and frequency of bowel movement (you may only need it a few times/week, or 1/2 cap; you may need a full cap every day, and that is safe to use longterm). You can consider a daily probiotic (such as Align).  If these measures aren't helping, you can discuss alternative with the gastroenterologist (ie medications for chronic constipation such as Linzess, Amitiza, etc).  We are referring you to a provider at [facility] that does hemorrhoidal banding. Use the Anusol HC suppositories twice daily as needed for inflamed hemorrhoids.  If you have abdominal pain, ongoing blood in the stool, or other issues (such as unintentional weight loss), you may need to be seen more urgently (especially if you feel weak, possibly getting anemic from ongoing bleeding).

## 2024-04-22 ENCOUNTER — Other Ambulatory Visit: Payer: Self-pay | Admitting: Nurse Practitioner

## 2024-04-22 DIAGNOSIS — I10 Essential (primary) hypertension: Secondary | ICD-10-CM

## 2024-04-23 ENCOUNTER — Ambulatory Visit: Attending: Hematology and Oncology

## 2024-04-23 VITALS — Wt 142.5 lb

## 2024-04-23 DIAGNOSIS — Z483 Aftercare following surgery for neoplasm: Secondary | ICD-10-CM | POA: Insufficient documentation

## 2024-04-23 NOTE — Therapy (Signed)
 OUTPATIENT PHYSICAL THERAPY SOZO SCREENING NOTE   Patient Name: Carol Harris MRN: [id number] DOB:[birth year], 68 y.o., female Today's Date: 04/23/2024  PCP: Randol Dawes, MD REFERRING PROVIDER: Odean Potts, MD   PT End of Session - 04/23/24 0840     Visit Number 13   # unchanged due to screen only   PT Start Time 0839    PT Stop Time 0843    PT Time Calculation (min) 4 min    Activity Tolerance Patient tolerated treatment well    Behavior During Therapy [facility] for tasks assessed/performed          Past Medical History:  Diagnosis Date   Anxiety    Colon polyp    GERD (gastroesophageal reflux disease)    HTN (hypertension), benign 09/05/2011   Hyperlipidemia, mixed 09/05/2011   Leukopenia 09/05/2011   Sleep apnea    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   no defecits   Past Surgical History:  Procedure Laterality Date   BREAST LUMPECTOMY WITH RADIOACTIVE SEED AND SENTINEL LYMPH NODE BIOPSY Left 06/25/2022   Procedure: LEFT BREAST LUMPECTOMY WITH RADIOACTIVE SEED AND SENTINEL LYMPH NODE BIOPSY;  Surgeon: Curvin Deward MOULD, MD;  Location: [facility];  Service: General;  Laterality: Left;   KNEE ARTHROSCOPY Right 2005 approx   torn meniscus   TUBAL LIGATION     twice (first failed)   uterine ablation     Patient Active Problem List   Diagnosis Date Noted   Vitamin D  deficiency 05/31/2023   Impaired fasting glucose 05/31/2023   Malignant neoplasm of lower-outer quadrant of left breast of female, estrogen receptor positive (HCC) 06/16/2022   Genetic testing 06/16/2022   Restless leg syndrome 11/08/2018   Left ventricular hypertrophy 11/08/2018   Depression 11/03/2018   Family history of colon cancer in mother 11/03/2018   History of vitamin D  deficiency 11/03/2018   Night terrors, adult 11/03/2018   History of CVA (cerebrovascular accident) 07/12/2014   Obstructive sleep apnea 01/08/2014   GERD (gastroesophageal reflux disease) 12/19/2013    Essential hypertension 09/05/2011   Hyperlipidemia, mixed 09/05/2011    REFERRING DIAG: left breast cancer at risk for lymphedema  THERAPY DIAG: Aftercare following surgery for neoplasm  PERTINENT HISTORY: 06/25/2022: Left lumpectomy: Grade 2 IDC 1.7 cm, margins negative, 0/5 sentinel nodes negative ER 95%, PR 80%, HER2 negative, Ki-67 15%  Adjuvant radiation completed 09/14/2022 No chemo, but is on letrozole , pt did not tolerate anastrozole    PRECAUTIONS: left UE Lymphedema risk, None  SUBJECTIVE: Pt returns for her 3 month L-Dex screen.   PAIN:  Are you having pain? No  SOZO SCREENING: Patient was assessed today using the SOZO machine to determine the lymphedema index score. This was compared to her baseline score. It was determined that she is within the recommended range when compared to her baseline and no further action is needed at this time. She will continue SOZO screenings. These are done every 3 months for 2 years post operatively followed by every 6 months for 2 years, and then annually.   L-DEX FLOWSHEETS - 04/23/24 0800       L-DEX LYMPHEDEMA SCREENING   Measurement Type Unilateral    L-DEX MEASUREMENT EXTREMITY Upper Extremity    POSITION  Standing    DOMINANT SIDE Right    At Risk Side Left    BASELINE SCORE (UNILATERAL) 1.2    L-DEX SCORE (UNILATERAL) 1.8    VALUE CHANGE (UNILAT) 0.6  Aden Berwyn Caldron, PTA 04/23/2024, 8:42 AM

## 2024-05-04 ENCOUNTER — Ambulatory Visit: Payer: Self-pay

## 2024-05-04 NOTE — Telephone Encounter (Signed)
 FYI Only or Action Required?: Action required by provider: patient is seeking recommendation from provider in regard to symptoms and BP.  Patient was last seen in primary care on 04/05/2024 by Randol Dawes, MD.  Called Nurse Triage reporting low blood pressure.  Symptoms began today.  Interventions attempted: Rest, hydration, or home remedies.  Symptoms are: stable.  Triage Disposition: See PCP When Office is Open (Within 3 Days)-would like a call back when appropriate.  Patient/caregiver understands and will follow disposition?: No, wishes to speak with PCP  Copied from CRM #[id number]. Topic: Clinical - Red Word Triage >> May 04, 2024  3:52 PM Carol Harris wrote: Red Word that prompted transfer to Nurse Triage: Patient calling to advise her 106/78. States she went walking this week and she there were a few times on her walk where she felt flushed and weak and needed to sit down for a few minutes. Reason for Disposition  [1] Fall in systolic BP > 20 mm Hg from normal AND [2] NOT feeling weak or lightheaded  Answer Assessment - Initial Assessment Questions 1. BLOOD PRESSURE: What is your blood pressure? Did you take at least two measurements 5 minutes apart?     106/78 2. ONSET: When did you take your blood pressure?     Prior to call 3. HOW: How did you take your blood pressure? (e.g., visiting nurse, automatic home BP monitor)     Automatic home BP monitor 4. HISTORY: Do you have a history of low blood pressure? What is your blood pressure normally?     128/70 5. MEDICINES: Are you taking any medicines for blood pressure? If Yes, ask: Have they been changed recently?     Atenolol  25 mg BID and Losartan  50 mg  6. PULSE RATE: Do you know what your pulse rate is?      Unable to say 7. OTHER SYMPTOMS: Have you been sick recently? Have you had a recent injury?     Patient reports that on her walks this week was having feelings of being flushed and needing to sit  down.  Patient reports only having one recorded blood pressure 106/78-patient reports that she hasn't been taking her blood pressure regularly. Patient states that after she had the symptoms she was having-she didn't check her blood pressure to see if there was any connection. Patient educated on the importance of checking her blood pressure. Patient would like recommendations from the provider on whether she should be seen sooner.  Protocols used: Blood Pressure - Low-A-AH

## 2024-05-17 DIAGNOSIS — F331 Major depressive disorder, recurrent, moderate: Secondary | ICD-10-CM | POA: Diagnosis not present

## 2024-05-28 ENCOUNTER — Ambulatory Visit: Admitting: Cardiology

## 2024-05-28 NOTE — Progress Notes (Unsigned)
 Ellouise Console, PA-C [address] [place], [place]  [address] Phone: [phone]   Gastroenterology Consultation  Referring Provider:     Randol Dawes, MD Primary Care Physician:  Randol Dawes, MD Primary Gastroenterologist:  Ellouise Console, PA-C / Elspeth Naval, MD  Reason for Consultation:     Rectal bleeding, hemorrhoids, family history of colon cancer        HPI:   Carol Harris is a 68 y.o. y/o female referred for consultation & management  by Randol Dawes, MD.    New patient.  Referred to evaluate rectal bleeding, hemorrhoids, and family history of colon cancer in her mother (age 82).  She underwent hemorrhoid banding by Dr. Luis x 2 in 2022.  She wonders if she needs another hemorrhoid banding.  Current symptoms: She has noticed mild intermittent episodes of bright red blood on the tissue after bowel movements for 6 months.  She has history of chronic constipation.  Noticed a flareup of hemorrhoids with rectal pain and swelling for the past week or 2.  She is eating prunes and drinking prune juice which helps.  Has occasionally takes MiraLAX sporadically as needed, but not consistently.  Admits to hard stools and straining.  Has used Anusol  suppository sporadically, but not consistently.  No anemia or weight loss.  08/2021 last colonoscopy by Dr. Luis: No polyps.  5-year repeat (due 08/2026).  11/2014 colonoscopy by Dr. Luis: No polyps.  Normal.   Past Medical History:  Diagnosis Date   Anxiety    Colon polyp    GERD (gastroesophageal reflux disease)    HTN (hypertension), benign 09/05/2011   Hyperlipidemia, mixed 09/05/2011   Leukopenia 09/05/2011   Sleep apnea    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   no defecits    Past Surgical History:  Procedure Laterality Date   BREAST LUMPECTOMY WITH RADIOACTIVE SEED AND SENTINEL LYMPH NODE BIOPSY Left 06/25/2022   Procedure: LEFT BREAST LUMPECTOMY WITH RADIOACTIVE SEED AND SENTINEL LYMPH NODE BIOPSY;  Surgeon:  Curvin Deward MOULD, MD;  Location: [facility];  Service: General;  Laterality: Left;   KNEE ARTHROSCOPY Right 2005 approx   torn meniscus   TUBAL LIGATION     twice (first failed)   uterine ablation      Prior to Admission medications   Medication Sig Start Date End Date Taking? Authorizing Provider  aspirin  EC 81 MG tablet Take 81 mg by mouth daily.    [provider]  atenolol  (TENORMIN ) 25 MG tablet Take 1 tablet (25 mg total) by mouth 2 (two) times daily. 06/01/23   Randol Dawes, MD  atorvastatin  (LIPITOR) 40 MG tablet Take 1 tablet (40 mg total) by mouth daily. 06/02/23   Randol Dawes, MD  buPROPion  (WELLBUTRIN  XL) 150 MG 24 hr tablet Take 300 mg by mouth every morning. 02/23/20   [provider]  Calcium  Carbonate (CALCIUM  500 PO) Take 1 tablet by mouth daily.    [provider]  cholecalciferol  (VITAMIN D3) 25 MCG (1000 UNIT) tablet Take 1,000 Units by mouth daily.    [provider]  cyanocobalamin 100 MCG tablet Take 1,000 mcg by mouth daily.    [provider]  hydrocortisone  (ANUSOL -HC) 25 MG suppository Place 1 suppository (25 mg total) rectally 2 (two) times daily. 04/05/24   Randol Dawes, MD  letrozole  (FEMARA ) 2.5 MG tablet TAKE 1 TABLET BY MOUTH EVERY DAY 11/29/23   Odean Potts, MD  losartan  (COZAAR ) 50 MG tablet TAKE 1  TABLET BY MOUTH EVERY DAY 04/23/24   Randol Dawes, MD    Family History  Problem Relation Age of Onset   Hypertension Father    Hyperlipidemia Father    Stroke Father        multiple and TIA's   Sleep apnea Father    Hypertension Mother    Uterine cancer Mother    Colon cancer Mother 39   Bipolar disorder Daughter        schizo affective   Uterine cancer Sister    Hyperlipidemia Sister    Acute myelogenous leukemia Brother        s/p stem cell transplant (in 3's)   Endometriosis Sister    Hyperlipidemia Sister      Social History   Tobacco Use   Smoking status: Never   Smokeless tobacco: Never   Vaping Use   Vaping status: Never Used  Substance Use Topics   Alcohol use: Yes    Alcohol/week: 1.0 standard drink of alcohol    Types: 1 Glasses of wine per week    Comment: 2 drinks/month   Drug use: No    Allergies as of 05/29/2024   (No Known Allergies)    Review of Systems:    All systems reviewed and negative except where noted in HPI.   Physical Exam:  BP (!) 142/76   Pulse 69   Ht 5' 5 (1.651 m)   Wt 142 lb (64.4 kg)   BMI 23.63 kg/m  No LMP recorded. Patient is postmenopausal.  General:   Alert,  Well-developed, well-nourished, pleasant and cooperative in NAD Lungs:  Respirations even and unlabored.  Clear throughout to auscultation.   No wheezes, crackles, or rhonchi. No acute distress. Heart:  Regular rate and rhythm; no murmurs, clicks, rubs, or gallops. Abdomen:  Normal bowel sounds.  No bruits.  Soft, and non-distended without masses, hepatosplenomegaly or hernias noted.  No Tenderness.  No guarding or rebound tenderness.    Rectal: There is a swollen external tender hemorrhoid at the right side of the anus, 3 o'clock position.  Not thrombosed.  No rectal masses.  No anal fissure or tenderness.  Stool is hard, brown, heme positive. Anoscopy: Moderate large internal hemorrhoids which are purple swollen, mildly bleeding (BRB).  No rectal masses.  Normal mucosa in the rectum. Neurologic:  Alert and oriented x3;  grossly normal neurologically. Psych:  Alert and cooperative. Normal mood and affect.  Chaperone for Exam:  Alan Fusi, CMA   Imaging Studies: No results found.  Labs: CBC    Component Value Date/Time   WBC 4.2 11/24/2023 1229   WBC 4.8 06/16/2022 0828   WBC 4.3 12/19/2013 0930   RBC 4.41 11/24/2023 1229   RBC 4.45 06/16/2022 0828   HGB 14.0 11/24/2023 1229   HGB 14.8 09/08/2011 1349   HCT 41.7 11/24/2023 1229   HCT 42.5 09/08/2011 1349   PLT 237 11/24/2023 1229   MCV 95 11/24/2023 1229   MCV 88.5 09/08/2011 1349    CMP      Component Value Date/Time   NA 136 11/24/2023 1229   K 4.4 11/24/2023 1229   CL 98 11/24/2023 1229   CO2 25 11/24/2023 1229   GLUCOSE 89 11/24/2023 1229   GLUCOSE 94 06/16/2022 0828   BUN 9 11/24/2023 1229   CREATININE 0.74 11/24/2023 1229   CREATININE 0.81 06/16/2022 0828   CALCIUM  9.5 11/24/2023 1229   PROT 7.2 11/24/2023 1229   ALBUMIN 4.4 11/24/2023 1229   AST 18  11/24/2023 1229   AST 17 06/16/2022 0828   ALT 11 11/24/2023 1229   ALT 17 06/16/2022 0828   ALKPHOS 84 11/24/2023 1229   BILITOT 0.3 11/24/2023 1229   BILITOT 0.7 06/16/2022 0828   GFRNONAA >60 06/16/2022 0828   GFRAA 83 11/13/2019 0954    Assessment and Plan:   Carol Harris is a 68 y.o. y/o female has been referred for:   Assessment: Internal Hemorrhoids, Mild Bleeding External Hemorrhoid, swollen Chronic Constipation Family history of colon cancer: Mother age 81  Plan: For External Hemorrhoids: Rx: Hydrocortisone  2.5% Cream BID, 30g, 1 RF Warm water sitz bath with epsom salt for flare up of external hemorrhoids. Use OTC Preparation H, Tucks Pads, and Witch Hazel wipes as needed. Discussed referral for Surgery as a last resort if Conservative treatment fails.  For Internal Hemorrhoids: Rx Hydrocortisone  Suppositories 25mg  Insert 1 into rectum once daily at bedtime for 12 days, #12, 1 RF. Stressed importance of treating underlying constipation. Avoid Sitting on the toilet for prolonged amount of time. Discussed Internal Hemorrhoid Banding if no improvement with conservative treament.  Encouraged her to take Miralax 1 capful every day consistently for Constipation.    Colonoscopy is up-to-date.  She had negative colonoscopies in 08/2021 and 2016.  5-year repeat colonoscopy will be due 08/2026 due to her Family History.  Follow up 2 months or sooner if symptoms worsen.  Ellouise Console, PA-C

## 2024-05-28 NOTE — Progress Notes (Deleted)
 [facility]:    Date:  05/28/2024   ID:  Carol Harris, Carol Harris [birth year], MRN [id number]  PCP:  Randol Dawes, MD  Cardiologist:  None { Click to update primary MD,subspecialty MD or APP then REFRESH:1}    Referring MD: Randol Dawes, MD   Chief Complaint: SVT  History of Present Illness:    Carol Harris is a 68 y.o. female with a history of thalamic infarct in 12/2013, hypertension, hyperlipidemia, GERD, sleep apnea, and anxiety who is being seen today as new patient in the [facility] for further evaluation of SVT.   Patient has a history of acute CVA in 12/2013 that was found after she presenting with slurred speech, expressive aphasia, and vertical diplopia. She was found to have an acute thalamic infarction. Echo showed LVEF of 50-65% with normal wall motion and diastolic function and mild MR. Carotid dopplers showed mild stenosis (1-39%) of bilateral ICAs. Myoview in 02/2014 was low risk with no evidence of ischemia. Repeat Echo in 10/2014 showed LVEF of 60-65% with no valvular disease. Repeat carotid dopplers in 10/2014 again showed mild stenosis of bilateral ICAs.   Patient was having some palpitations earlier this year. PCP ordered a 2 week monitor. Monitor from 2/21/025 to 12/16/2023 showed 6 short episodes of SVT (longest run 17 beats) that looked like atrial tachycardia as well as rare PAC/ PVCs. Triggered symptoms corresponded with sinus rhythm +/- atrial tachycardia.   Patient presents today for further evaluation of this. ***  Palpitations Paroxysmal Atrial Tachycardia Patient has a history of palpitations. Monitor in February/ March 2025 showed 6 short episodes of SVT (longest run 17 beats) that looked like atrial tachycardia as well as rare PAC/ PVCs. Triggered symptoms corresponded with sinus rhythm +/- atrial tachycardia.  - *** - Continue Atenolol  25mg  twice daily.   Hypertension BP well controlled. *** - Continue Atenolol  25mg  twice daily  and Losartan  50mg  daily.   Hyperlipidemia Lipid panel in 05/2023: Total Cholesterol 172, Triglycerides 82, HDL 64, LDL 93. LDL goal <70 given history stroke.  - Continue Lipitor 40mg  daily.  - LDL not at goal. ***  History of CVA History of thalamic stroke in 2015.  - Continue Aspirin  and Lipitor.    EKGs/Labs/Other Studies Reviewed:    The following studies were reviewed:  Monitor 12/02/2023 to 12/16/2023: HR 46 - 188, average 66 bpm. 6 nonsustained SVT, longest 17 beats. Rhythm strip suggests AT. Rare supraventricular and ventricular ectopy. No atrial fibrillation. Symptom trigger episodes correspond to sinus rhythm +/- AT.   EKG:  EKG ordered today. EKG personally reviewed and demonstrates ***.  Recent Labs: 06/01/2023: TSH 0.660 11/24/2023: ALT 11; BUN 9; Creatinine, Ser 0.74; Hemoglobin 14.0; Platelets 237; Potassium 4.4; Sodium 136  Recent Lipid Panel    Component Value Date/Time   CHOL 172 06/01/2023 1050   TRIG 82 06/01/2023 1050   HDL 64 06/01/2023 1050   CHOLHDL 2.7 06/01/2023 1050   CHOLHDL 4.3 12/20/2013 0542   VLDL 35 12/20/2013 0542   LDLCALC 93 06/01/2023 1050    Physical Exam:    Vital Signs: There were no vitals taken for this visit.    Wt Readings from Last 3 Encounters:  04/23/24 142 lb 8 oz (64.6 kg)  04/05/24 144 lb 3.2 oz (65.4 kg)  01/05/24 146 lb 9.6 oz (66.5 kg)     General: 68 y.o. female in no acute distress. HEENT: Normocephalic and atraumatic. Sclera clear.  Neck: Supple. No carotid bruits. No  JVD. Heart: *** RRR. Distinct S1 and S2. No murmurs, gallops, or rubs.  Lungs: No increased work of breathing. Clear to ausculation bilaterally. No wheezes, rhonchi, or rales.  Abdomen: Soft, non-distended, and non-tender to palpation.  Extremities: No lower extremity edema.  Radial and distal pedal pulses 2+ and equal bilaterally. Skin: Warm and dry. Neuro: No focal deficits. Psych: Normal affect. Responds appropriately.   Assessment:     No diagnosis found.  Plan:     Disposition: Follow up in ***   Signed, Aline FORBES Door, PA-C  05/28/2024 2:27 PM    [facility]

## 2024-05-29 ENCOUNTER — Ambulatory Visit (INDEPENDENT_AMBULATORY_CARE_PROVIDER_SITE_OTHER): Admitting: Physician Assistant

## 2024-05-29 ENCOUNTER — Encounter: Payer: Self-pay | Admitting: Physician Assistant

## 2024-05-29 VITALS — BP 142/76 | HR 69 | Ht 65.0 in | Wt 142.0 lb

## 2024-05-29 DIAGNOSIS — R928 Other abnormal and inconclusive findings on diagnostic imaging of breast: Secondary | ICD-10-CM | POA: Diagnosis not present

## 2024-05-29 DIAGNOSIS — K5909 Other constipation: Secondary | ICD-10-CM | POA: Diagnosis not present

## 2024-05-29 DIAGNOSIS — Z800 Family history of malignant neoplasm of digestive organs: Secondary | ICD-10-CM | POA: Diagnosis not present

## 2024-05-29 DIAGNOSIS — K644 Residual hemorrhoidal skin tags: Secondary | ICD-10-CM

## 2024-05-29 DIAGNOSIS — K648 Other hemorrhoids: Secondary | ICD-10-CM | POA: Diagnosis not present

## 2024-05-29 DIAGNOSIS — K5904 Chronic idiopathic constipation: Secondary | ICD-10-CM

## 2024-05-29 DIAGNOSIS — Z853 Personal history of malignant neoplasm of breast: Secondary | ICD-10-CM | POA: Diagnosis not present

## 2024-05-29 MED ORDER — HYDROCORTISONE (PERIANAL) 2.5 % EX CREA: 1.0000 | TOPICAL_CREAM | Freq: Two times a day (BID) | CUTANEOUS | 1 refills | Status: AC

## 2024-05-29 MED ORDER — HYDROCORTISONE ACETATE 25 MG RE SUPP: 25.0000 mg | Freq: Every day | RECTAL | 1 refills | Status: AC

## 2024-05-29 NOTE — Patient Instructions (Addendum)
 For External Hemorrhoids: Rx: Hydrocortisone  2.5% Cream BID, 30g, 1 RF Warm water sitz bath with epsom salt for flare up of external hemorrhoids. Use OTC Preparation H, Tucks Pads, and Witch Hazel wipes as needed. Discussed referral for Surgery as a last resort if Conservative treatment fails.  For Internal Hemorrhoids: Rx: Hydrocortisone  Suppositories 25mg  Insert 1 into rectum once daily at bedtime for 12 days, #12, 1 RF. Stressed importance of treating underlying constipation. Avoid Sitting on the toilet for prolonged amount of time. Discussed Internal Hemorrhoid Banding if no improvement with conservative treament.  Continue Miralax 1 capful every day.  Please follow up sooner if symptoms increase or worsen  Due to recent changes in healthcare laws, you may see the results of your imaging and laboratory studies on MyChart before your provider has had a chance to review them.  We understand that in some cases there may be results that are confusing or concerning to you. Not all laboratory results come back in the same time frame and the provider may be waiting for multiple results in order to interpret others.  Please give us  48 hours in order for your provider to thoroughly review all the results before contacting the office for clarification of your results.   Thank you for trusting me with your gastrointestinal care!   Ellouise Console, PA-C _______________________________________________________  If your blood pressure at your visit was 140/90 or greater, please contact your primary care physician to follow up on this.  _______________________________________________________  If you are age 23 or older, your body mass index should be between 23-30. Your Body mass index is 23.63 kg/m. If this is out of the aforementioned range listed, please consider follow up with your Primary Care Provider.  If you are age 101 or younger, your body mass index should be between 19-25. Your Body mass  index is 23.63 kg/m. If this is out of the aformentioned range listed, please consider follow up with your Primary Care Provider.   ________________________________________________________  The [facility] providers would like to encourage you to use MYCHART to communicate with providers for non-urgent requests or questions.  Due to long hold times on the telephone, sending your provider a message by [facility] may be a faster and more efficient way to get a response.  Please allow 48 business hours for a response.  Please remember that this is for non-urgent requests.  _______________________________________________________

## 2024-05-29 NOTE — Progress Notes (Signed)
 Agree with the plan as outlined.  Can certainly consider additional banding pending her course if needed

## 2024-05-30 ENCOUNTER — Other Ambulatory Visit: Payer: Self-pay | Admitting: Family Medicine

## 2024-05-30 DIAGNOSIS — I10 Essential (primary) hypertension: Secondary | ICD-10-CM

## 2024-05-31 ENCOUNTER — Ambulatory Visit: Admitting: Student

## 2024-05-31 ENCOUNTER — Encounter: Payer: Self-pay | Admitting: Hematology and Oncology

## 2024-05-31 DIAGNOSIS — Z634 Disappearance and death of family member: Secondary | ICD-10-CM | POA: Diagnosis not present

## 2024-05-31 DIAGNOSIS — G4733 Obstructive sleep apnea (adult) (pediatric): Secondary | ICD-10-CM | POA: Diagnosis not present

## 2024-05-31 DIAGNOSIS — F4320 Adjustment disorder, unspecified: Secondary | ICD-10-CM | POA: Diagnosis not present

## 2024-06-18 NOTE — Patient Instructions (Signed)
  HEALTH MAINTENANCE RECOMMENDATIONS:  It is recommended that you get at least 30 minutes of aerobic exercise at least 5 days/week (for weight loss, you may need as much as 60-90 minutes). This can be any activity that gets your heart rate up. This can be divided in 10-15 minute intervals if needed, but try and build up your endurance at least once a week.  Weight bearing exercise is also recommended twice weekly.  Eat a healthy diet with lots of vegetables, fruits and fiber.  Colorful foods have a lot of vitamins (ie green vegetables, tomatoes, red peppers, etc).  Limit sweet tea, regular sodas and alcoholic beverages, all of which has a lot of calories and sugar.  Up to 1 alcoholic drink daily may be beneficial for women (unless trying to lose weight, watch sugars).  Drink a lot of water.  Calcium  recommendations are 1200-1500 mg daily (1500 mg for postmenopausal women or women without ovaries), and vitamin D  1000 IU daily.  This should be obtained from diet and/or supplements (vitamins), and calcium  should not be taken all at once, but in divided doses.  Monthly self breast exams and yearly mammograms for women over the age of 36 is recommended.  Sunscreen of at least SPF 30 should be used on all sun-exposed parts of the skin when outside between the hours of 10 am and 4 pm (not just when at beach or pool, but even with exercise, golf, tennis, and yard work!)  Use a sunscreen that says broad spectrum so it covers both UVA and UVB rays, and make sure to reapply every 1-2 hours.  Remember to change the batteries in your smoke detectors when changing your clock times in the spring and fall. Carbon monoxide detectors are recommended for your home.  Use your seat belt every time you are in a car, and please drive safely and not be distracted with cell phones and texting while driving.   Ms. Kloos , Thank you for taking time to come for your Medicare Wellness Visit. I appreciate your ongoing  commitment to your health goals. Please review the following plan we discussed and let me know if I can assist you in the future.   This is a list of the screening recommended for you and due dates:  Health Maintenance  Topic Date Due   Flu Shot  05/11/2024   Medicare Annual Wellness Visit  05/31/2024   COVID-19 Vaccine (8 - Pfizer risk 2024-25 season) 06/11/2024   DEXA scan (bone density measurement)  07/19/2024   Mammogram  05/29/2025   Colon Cancer Screening  08/28/2031   DTaP/Tdap/Td vaccine (4 - Td or Tdap) 09/04/2033   Pneumococcal Vaccine for age over 27  Completed   Hepatitis C Screening  Completed   Zoster (Shingles) Vaccine  Completed   HPV Vaccine  Aged Out   Meningitis B Vaccine  Aged Out    Please bring us  copies of your Living Will and Healthcare Power of Attorney so that it can be scanned into your medical chart.  COVID booster is recommended.

## 2024-06-18 NOTE — Progress Notes (Unsigned)
 No chief complaint on file.   Carol Harris is a 68 y.o. female who presents for annual physical exam, Medicare wellness visit and follow-up on chronic medical conditions.   She is under the care of GYN, last seen 12/2023.  Constipation:  She has been eating prunes, drinking prune juice, and trying to eat a high fiber diet.  Doesn't tolerate fiber supplements (cause bloating). Using some miralax, not all that helpful (using prn, not regularly) She has used dulcolax--1 isn't enough, and 2 is too much--cramps and diarrhea.  She saw GI last month for internal/external hemorrhoids, rx'd HC cream and suppositories. To consider banding if not improving.  Palpitations:  Zio monitor showed nonsustained SVT and atrial tachycardia.  She had episodes of weakness related to that.   She denies further problems palpitations or episodes of weakness.    Depression and anxiety:  She is taking Wellbutrin , and sees psych regularly.  Doesn't currently see a therapist, but getting grief counseling through hospice.  She reports her moods are good overall.  Sadness comes and goes.   Hypertension:  She is compliant with taking Losartan  50mg  daily and atenolol  25mg  BID. BP's have been running *** Her BP monitor was verified as accurate in 06/2022. She denies headaches, dizziness, chest pain, palpitations, edema or side effects to medications.    BP Readings from Last 3 Encounters:  05/29/24 (!) 142/76  04/05/24 128/82  01/05/24 132/78    Hyperlipidemia: patient is compliant with taking atorvastatin  and following low cholesterol diet. She denies side effects. She eats fish and some poultry, no red meat. Lots of vegetables and vegetable protein. +cheese, butter (she cut back on this), 1-2 eggs/week She is due for recheck.  Lipids were at goal on current regimen last year.  Lab Results  Component Value Date   CHOL 172 06/01/2023   HDL 64 06/01/2023   LDLCALC 93 06/01/2023   TRIG 82 06/01/2023    CHOLHDL 2.7 06/01/2023   Impaired fasting glucose--She has had A1c of 5.7 in the past.  This was down to 5.5 last year, having lost some weight, and limiting sweets and carbs in her diet.  OSA: compliant with CPAP. She reports doing well, feeling refreshed in the mornings, and no daytime somnolence.  She is continuing to benefit from CPAP use.   Restless legs--relieved by OTC Mg supplement. She uses this just prn (didn't like the taste).   Breast Cancer, left:  diagnosed 06/2022 with stage 1A invasive ductal carcinoma, ER+/PR+/HER2-. She was treated with lumpectomy, adjuvant radiation therapy, and anti-estrogen therapy with Letrozole  which was started in 11/2022. She is tolerating this well.  (Had jaw pain from arimidex ). She denies breast concerns.  Vitamin D  deficiency: Last level was normal at 33.9 in 11/2019. She takes 5000 IU 4x/week, and has been doing so for quite a while. She continues on this dose.  H/o CVA. MRI in 12/2013 showed acute L thalamic infarct without hemorrhage.  She takes aspirin  and statin.  She denies any neurologic symptoms. Notes a bit of a tremor on the left, mainly when stressed.  She has a remote history of leukopenia that was evaluated by Dr. Freddie with no apparent follow-up needed. CBC's have been normal. Lab Results  Component Value Date   WBC 4.2 11/24/2023   HGB 14.0 11/24/2023   HCT 41.7 11/24/2023   MCV 95 11/24/2023   PLT 237 11/24/2023    Immunization History  Administered Date(s) Administered   DTaP 06/11/2001   Fluad  Quad(high  Dose 65+) 07/11/2019, 08/13/2021, 06/21/2022   Fluad  Trivalent(High Dose 65+) 06/22/2023   Influenza,inj,Quad PF,6+ Mos 11/03/2018   PFIZER Comirnaty [place]jos Top)Covid-19 Tri-Sucrose Vaccine 01/21/2021   PFIZER(Purple Top)SARS-COV-2 Vaccination 11/17/2019, 12/12/2019, 07/13/2020   PNEUMOCOCCAL CONJUGATE-20 05/20/2021   Pfizer Covid-19 Vaccine Bivalent Booster 42yrs & up 10/14/2021   Pfizer(Comirnaty )Fall Seasonal  Vaccine 12 years and older 07/18/2023   Respiratory Syncytial Virus Vaccine,Recomb Aduvanted(Arexvy) 12/30/2022   Tdap 04/21/2011, 09/05/2023   Unspecified SARS-COV-2 Vaccination 07/14/2022   Zoster Recombinant(Shingrix ) 11/03/2018, 01/15/2019   One side of her body swelled up after a pneumonia vaccine in the late 90's. Last Pap smear: per gynecologist, 02/2019 *** 12/2023? Last mammogram: 05/2024 Last colonoscopy: 08/2021 normal, internal hemorrhoids, Dr. Luis; had been getting q5 years due to FHx Last DEXA:  07/2022 T -1.5 at R fem neck  Dentist: every 6 months Ophtho: yearly Exercise:    walks 1-2 miles 5-6x/week, about 30-40 minutes. Yoga at the [facility], sporadic. Uses 5# handweights 2-3x/week (arms exercises, sometimes is sitting). Does push-ups.     Patient Care Team: Randol Dawes, MD as PCP - General (Family Medicine) Curvin Deward MOULD, MD as Consulting Physician (General Surgery) Odean Potts, MD as Consulting Physician (Hematology and Oncology) Dewey Rush, MD as Consulting Physician (Radiation Oncology) Cleotilde Sewer, OD (Optometry) Kandyce Sor, MD as Consulting Physician (Obstetrics and Gynecology) Ivin Kocher, MD as Referring Physician (Dermatology) GI: Dr. Luis (retired) Counselor:  Vina Sar (not currently) Psych: Grayce (NP) at [facility]. Dentist: Dr. Vicenta Sewer   Depression Screening: Flowsheet Row Office Visit from 07/21/2023 in [facility]  PHQ-2 Total Score 0    Falls screen:     06/01/2023    9:38 AM 05/20/2022    1:32 PM 01/21/2021    2:29 PM 04/29/2020   11:07 AM 09/05/2019    9:47 AM  Fall Risk   Falls in the past year? 1 0 0 0 0   Comment     Emmi Telephone Survey: data to providers prior to load   Number falls in past yr: 0 0 0    Comment tripped and fell and landed on table at daycare-March 25, 2023      Injury with Fall? 0 0 0    Comment bruises      Risk for fall due to : History of fall(s) No Fall Risks  No Fall Risks    Follow up Falls evaluation completed Falls evaluation completed  Falls evaluation completed        Data saved with a previous flowsheet row definition     Functional Status Survey:    Forgets why she walked into a room sometimes, and forgets some names.  Intermittent.      End of Life Discussion:  Patient has a living will and medical power of attorney We do not have a copy on file   PMH, PSH, SH and FH were reviewed and updated    ROS:  Patient denies anorexia, fever, headaches, vision changes, decreased hearing, URI symptoms, breast concerns, chest pain, palpitations, dizziness, syncope, dyspnea on exertion, cough, swelling, nausea, vomiting, diarrhea, constipation, abdominal pain, melena, hematochezia, hematuria, incontinence, dysuria, vaginal bleeding, discharge, odor or itch, genital lesions, joint pains, numbness, tingling, weakness, tremor, suspicious skin lesions, abnormal bleeding/bruising, or enlarged lymph nodes. Denies heartburn. Anxiety and depression are controlled L hand tremor, mainly noted with stress. Some hair loss, dry skin and fatigue, some of which is since starting letrozole .   Denies myalgias/arthralgias.  Consitpation hemorrhoids    PHYSICAL  EXAM:  There were no vitals taken for this visit.  Wt Readings from Last 3 Encounters:  05/29/24 142 lb (64.4 kg)  04/23/24 142 lb 8 oz (64.6 kg)  04/05/24 144 lb 3.2 oz (65.4 kg)   07/21/23 152 lb 9.6 oz (69.2 kg)  06/21/23 153 lb 8 oz (69.6 kg)  06/06/23 154 lb 6 oz (70 kg)  05/2022 159# 3.2 oz   General Appearance: Alert, cooperative, no distress, appears stated age Head: Normocephalic, without obvious abnormality, atraumatic Eyes: PERRL, conjunctiva/corneas clear, EOM's intact, fundi benign Ears: Normal TM's and external ear canals Nose: Nares normal, mucosa normal, no drainage or sinus tenderness Throat: Lips, mucosa, and tongue normal; teeth and gums normal Neck: Supple, no  lymphadenopathy;  thyroid :  no enlargement/ tenderness/nodules; no carotid bruit or JVD Lungs: Clear to auscultation bilaterally without wheezes, rales or ronchi; respirations unlabored Heart: Regular rate and rhythm, S1 and S2 normal, no murmur, rub or gallop Abdomen: Soft, non-tender, nondistended, normoactive bowel sounds, no masses, no hepatosplenomegaly Skin:  Skin color, texture, turgor normal, no rashes or lesions Lymph nodes: Cervical, supraclavicular, and axillary nodes normal Neurologic:  Normal strength, sensation and gait; reflexes 2+ and symmetric throughout. No tremor noted Psych: Normal mood, affect, hygiene and grooming.  Pelvic/rectal exams were deferred to GYN  Prior--*** L breast--WHSS along the outer portion of the areola. Firm area palpable at the 2 o'clock position, just off from the incision/scar. Nontender. No axillary lymphadenopathy  ASSESSMENT/PLAN:   WORKING ON THIS, NOT DONE  Flu shot A1c WITH LABS C-met, cbc, lipids D if any changes to supplements TSH if weight loss, symptoms  She sees GYN, doesn't need breast exam unless she has concerns/changes  I think she had pap at last GYN visit in 12/2023. Didn't get any GYN records, please request [place])   Discussed monthly self breast exams and yearly mammograms; at least 30 minutes of aerobic activity at least 5 days/week and weight-bearing exercise 2x/week; proper sunscreen use reviewed; healthy diet, including goals of calcium  and vitamin D  intake and alcohol recommendations (less than or equal to 1 drink/day) reviewed; regular seatbelt use; changing batteries in smoke detectors, use of carbon monoxide detectors.  Immunization recommendations discussed--continue yearly high dose flu shots.  To get Tdap from the pharmacy. Updated COVID booster recommended when available in the Fall. Colonoscopy recommendations--UTD  MOST form completed, full code, full care Need copies of living will and healthcare POA,  discussed.   Medicare Attestation I have personally reviewed: The patient's medical and social history Their use of alcohol, tobacco or illicit drugs Their current medications and supplements The patient's functional ability including ADLs,fall risks, home safety risks, cognitive, and hearing and visual impairment Diet and physical activities Evidence for depression or mood disorders  The patient's weight, height, BMI have been recorded in the chart.  I have made referrals, counseling, and provided education to the patient based on review of the above and I have provided the patient with a written personalized care plan for preventive services.     Annabelle DELENA Fetters, MD

## 2024-06-20 ENCOUNTER — Inpatient Hospital Stay: Payer: PPO | Admitting: Hematology and Oncology

## 2024-06-20 ENCOUNTER — Encounter: Payer: Self-pay | Admitting: Family Medicine

## 2024-06-20 ENCOUNTER — Ambulatory Visit: Payer: PPO | Admitting: Family Medicine

## 2024-06-20 VITALS — BP 130/74 | HR 56 | Ht 64.0 in | Wt 142.2 lb

## 2024-06-20 DIAGNOSIS — I471 Supraventricular tachycardia, unspecified: Secondary | ICD-10-CM

## 2024-06-20 DIAGNOSIS — Z1159 Encounter for screening for other viral diseases: Secondary | ICD-10-CM

## 2024-06-20 DIAGNOSIS — I10 Essential (primary) hypertension: Secondary | ICD-10-CM | POA: Diagnosis not present

## 2024-06-20 DIAGNOSIS — R634 Abnormal weight loss: Secondary | ICD-10-CM | POA: Diagnosis not present

## 2024-06-20 DIAGNOSIS — R7301 Impaired fasting glucose: Secondary | ICD-10-CM

## 2024-06-20 DIAGNOSIS — Z5181 Encounter for therapeutic drug level monitoring: Secondary | ICD-10-CM

## 2024-06-20 DIAGNOSIS — E782 Mixed hyperlipidemia: Secondary | ICD-10-CM | POA: Diagnosis not present

## 2024-06-20 DIAGNOSIS — K5900 Constipation, unspecified: Secondary | ICD-10-CM

## 2024-06-20 DIAGNOSIS — Z23 Encounter for immunization: Secondary | ICD-10-CM | POA: Diagnosis not present

## 2024-06-20 DIAGNOSIS — Z0000 Encounter for general adult medical examination without abnormal findings: Secondary | ICD-10-CM

## 2024-06-20 DIAGNOSIS — E559 Vitamin D deficiency, unspecified: Secondary | ICD-10-CM | POA: Diagnosis not present

## 2024-06-20 DIAGNOSIS — C50512 Malignant neoplasm of lower-outer quadrant of left female breast: Secondary | ICD-10-CM

## 2024-06-20 LAB — LIPID PANEL

## 2024-06-21 ENCOUNTER — Ambulatory Visit: Payer: Self-pay | Admitting: Family Medicine

## 2024-06-21 LAB — LIPID PANEL
Cholesterol, Total: 146 mg/dL (ref 100–199)
HDL: 61 mg/dL (ref >39)
LDL CALC COMMENT:: 2.4 ratio (ref 0.0–4.4)
LDL Chol Calc (NIH): 71 mg/dL (ref 0–99)
Triglycerides: 68 mg/dL (ref 0–149)
VLDL Cholesterol Cal: 14 mg/dL (ref 5–40)

## 2024-06-21 LAB — COMPREHENSIVE METABOLIC PANEL WITH GFR
ALT: 19 IU/L (ref 0–32)
AST: 20 IU/L (ref 0–40)
Albumin: 4.5 g/dL (ref 3.9–4.9)
Alkaline Phosphatase: 77 IU/L (ref 44–121)
BUN/Creatinine Ratio: 14 (ref 12–28)
BUN: 10 mg/dL (ref 8–27)
Bilirubin Total: 0.7 mg/dL (ref 0.0–1.2)
CO2: 23 mmol/L (ref 20–29)
Calcium: 9.6 mg/dL (ref 8.7–10.3)
Chloride: 98 mmol/L (ref 96–106)
Creatinine, Ser: 0.7 mg/dL (ref 0.57–1.00)
Globulin, Total: 2.9 g/dL (ref 1.5–4.5)
Glucose: 93 mg/dL (ref 70–99)
Potassium: 4.5 mmol/L (ref 3.5–5.2)
Sodium: 135 mmol/L (ref 134–144)
Total Protein: 7.4 g/dL (ref 6.0–8.5)
eGFR: 94 mL/min/1.73 (ref >59)

## 2024-06-21 LAB — CBC WITH DIFFERENTIAL/PLATELET
Basophils Absolute: 0 x10E3/uL (ref 0.0–0.2)
Basos: 1 %
EOS (ABSOLUTE): 0 x10E3/uL (ref 0.0–0.4)
Eos: 0 %
Hematocrit: 41.9 % (ref 34.0–46.6)
Hemoglobin: 13.7 g/dL (ref 11.1–15.9)
Immature Grans (Abs): 0 x10E3/uL (ref 0.0–0.1)
Immature Granulocytes: 0 %
Lymphocytes Absolute: 1.5 x10E3/uL (ref 0.7–3.1)
Lymphs: 32 %
MCH: 31.1 pg (ref 26.6–33.0)
MCHC: 32.7 g/dL (ref 31.5–35.7)
MCV: 95 fL (ref 79–97)
Monocytes Absolute: 0.5 x10E3/uL (ref 0.1–0.9)
Monocytes: 10 %
Neutrophils Absolute: 2.7 x10E3/uL (ref 1.4–7.0)
Neutrophils: 57 %
Platelets: 248 x10E3/uL (ref 150–450)
RBC: 4.41 x10E6/uL (ref 3.77–5.28)
RDW: 13.3 % (ref 11.7–15.4)
WBC: 4.8 x10E3/uL (ref 3.4–10.8)

## 2024-06-21 LAB — HEMOGLOBIN A1C
Est. average glucose Bld gHb Est-mCnc: 111 mg/dL
Hgb A1c MFr Bld: 5.5 % (ref 4.8–5.6)

## 2024-06-21 LAB — VITAMIN D 25 HYDROXY (VIT D DEFICIENCY, FRACTURES): Vit D, 25-Hydroxy: 35 ng/mL (ref 30.0–100.0)

## 2024-06-21 LAB — HEPATITIS B SURFACE ANTIBODY, QUANTITATIVE: Hepatitis B Surf Ab Quant: 90.6 m[IU]/mL

## 2024-06-21 LAB — TSH: TSH: 0.532 u[IU]/mL (ref 0.450–4.500)

## 2024-06-22 ENCOUNTER — Encounter: Payer: Self-pay | Admitting: Family Medicine

## 2024-06-22 MED ORDER — ATORVASTATIN CALCIUM 40 MG PO TABS: 40.0000 mg | ORAL_TABLET | Freq: Every day | ORAL | 3 refills | Status: AC

## 2024-06-30 ENCOUNTER — Emergency Department (HOSPITAL_COMMUNITY)
Admission: EM | Admit: 2024-06-30 | Discharge: 2024-06-30 | Disposition: A | Discharge status: Home / Self Care | Attending: Emergency Medicine | Admitting: Emergency Medicine

## 2024-06-30 ENCOUNTER — Emergency Department (HOSPITAL_COMMUNITY)

## 2024-06-30 ENCOUNTER — Other Ambulatory Visit: Payer: Self-pay

## 2024-06-30 ENCOUNTER — Encounter (HOSPITAL_COMMUNITY): Payer: Self-pay | Admitting: Emergency Medicine

## 2024-06-30 DIAGNOSIS — I10 Essential (primary) hypertension: Secondary | ICD-10-CM | POA: Insufficient documentation

## 2024-06-30 DIAGNOSIS — R0789 Other chest pain: Secondary | ICD-10-CM | POA: Diagnosis not present

## 2024-06-30 DIAGNOSIS — I471 Supraventricular tachycardia, unspecified: Secondary | ICD-10-CM | POA: Diagnosis not present

## 2024-06-30 DIAGNOSIS — R079 Chest pain, unspecified: Secondary | ICD-10-CM | POA: Diagnosis not present

## 2024-06-30 DIAGNOSIS — Z79899 Other long term (current) drug therapy: Secondary | ICD-10-CM | POA: Diagnosis not present

## 2024-06-30 DIAGNOSIS — R531 Weakness: Secondary | ICD-10-CM | POA: Diagnosis not present

## 2024-06-30 DIAGNOSIS — R42 Dizziness and giddiness: Secondary | ICD-10-CM | POA: Diagnosis not present

## 2024-06-30 DIAGNOSIS — Z7982 Long term (current) use of aspirin: Secondary | ICD-10-CM | POA: Insufficient documentation

## 2024-06-30 DIAGNOSIS — R55 Syncope and collapse: Secondary | ICD-10-CM | POA: Diagnosis not present

## 2024-06-30 LAB — I-STAT CHEM 8, ED
BUN: 11 mg/dL (ref 8–23)
Calcium, Ion: 1.13 mmol/L — ABNORMAL LOW (ref 1.15–1.40)
Chloride: 99 mmol/L (ref 98–111)
Creatinine, Ser: 0.8 mg/dL (ref 0.44–1.00)
Glucose, Bld: 100 mg/dL — ABNORMAL HIGH (ref 70–99)
HCT: 41 % (ref 36.0–46.0)
Hemoglobin: 13.9 g/dL (ref 12.0–15.0)
Potassium: 3.9 mmol/L (ref 3.5–5.1)
Sodium: 136 mmol/L (ref 135–145)
TCO2: 25 mmol/L (ref 22–32)

## 2024-06-30 LAB — DIFFERENTIAL
Abs Immature Granulocytes: 0.02 K/uL (ref 0.00–0.07)
Basophils Absolute: 0 K/uL (ref 0.0–0.1)
Basophils Relative: 1 %
Eosinophils Absolute: 0 K/uL (ref 0.0–0.5)
Eosinophils Relative: 0 %
Immature Granulocytes: 0 %
Lymphocytes Relative: 33 %
Lymphs Abs: 2 K/uL (ref 0.7–4.0)
Monocytes Absolute: 0.5 K/uL (ref 0.1–1.0)
Monocytes Relative: 9 %
Neutro Abs: 3.4 K/uL (ref 1.7–7.7)
Neutrophils Relative %: 57 %

## 2024-06-30 LAB — COMPREHENSIVE METABOLIC PANEL WITH GFR
ALT: 20 U/L (ref 0–44)
AST: 20 U/L (ref 15–41)
Albumin: 4 g/dL (ref 3.5–5.0)
Alkaline Phosphatase: 60 U/L (ref 38–126)
Anion gap: 11 (ref 5–15)
BUN: 22 mg/dL (ref 8–23)
CO2: 24 mmol/L (ref 22–32)
Calcium: 9.3 mg/dL (ref 8.9–10.3)
Chloride: 100 mmol/L (ref 98–111)
Creatinine, Ser: 0.79 mg/dL (ref 0.44–1.00)
GFR, Estimated: >60 mL/min (ref >60)
Glucose, Bld: 99 mg/dL (ref 70–99)
Potassium: 3.8 mmol/L (ref 3.5–5.1)
Sodium: 135 mmol/L (ref 135–145)
Total Bilirubin: 0.8 mg/dL (ref 0.0–1.2)
Total Protein: 7.4 g/dL (ref 6.5–8.1)

## 2024-06-30 LAB — CBC
HCT: 40.3 % (ref 36.0–46.0)
Hemoglobin: 13.6 g/dL (ref 12.0–15.0)
MCH: 31.1 pg (ref 26.0–34.0)
MCHC: 33.7 g/dL (ref 30.0–36.0)
MCV: 92.2 fL (ref 80.0–100.0)
Platelets: 281 K/uL (ref 150–400)
RBC: 4.37 MIL/uL (ref 3.87–5.11)
RDW: 12.3 % (ref 11.5–15.5)
WBC: 5.9 K/uL (ref 4.0–10.5)
nRBC: 0 % (ref 0.0–0.2)

## 2024-06-30 LAB — ETHANOL: Alcohol, Ethyl (B): <15 mg/dL (ref <15)

## 2024-06-30 LAB — PROTIME-INR
INR: 1.1 (ref 0.8–1.2)
Prothrombin Time: 14.6 s (ref 11.4–15.2)

## 2024-06-30 LAB — APTT: aPTT: 27 s (ref 24–36)

## 2024-06-30 LAB — TROPONIN I (HIGH SENSITIVITY)
Troponin I (High Sensitivity): 3 ng/L (ref <18)
Troponin I (High Sensitivity): 4 ng/L (ref <18)

## 2024-06-30 NOTE — ED Triage Notes (Signed)
 C/o elevatated blood pressure with weakness and dizziness onset several  days ago worse today

## 2024-06-30 NOTE — Discharge Instructions (Signed)
 If your blood pressure remains elevated you can take an additional losartan  making it 100 mg/day or you can call your doctor on Monday to see if they want to adjust blood pressure medication.  If your blood pressure remains at 130/80 you may not need to change the blood pressure medication.  If you start having significant chest pain, shortness of breath or any passing out return to the emergency room but you will need to follow-up with a cardiologist to get some more test on your heart.  Possible test they might want to do is an echo and a stress test.  Avoid any strenuous exercise until cleared by the cardiologist.  All the blood work today looks good.

## 2024-06-30 NOTE — ED Provider Notes (Signed)
 [place] EMERGENCY DEPARTMENT AT [facility] Provider Note   CSN: [id number] Arrival date & time: 06/30/24  1314     Patient presents with: Dizziness   Carol Harris is a 68 y.o. female.   Patient is a 68 year old female with a history of hypertension, hyperlipidemia and prior thalamic infarct of unknown etiology who is presenting today with her daughter due to having intermittent episodes of feeling dizzy and generally weak.  These have now been going on for weeks.  She feels that they only occur when she is up doing things and she has not noticed them when she is just sitting or laying down.  They seem to come intermittently and at times when she gets them she will notice that she might have a little bit of tightness in her chest but feels like she needs to sit down.  She has never had full syncope related to these events but today she had an episode and it lasted a little longer and she felt a little nauseated.  She checked her blood pressure during this episode and it was elevated.  She reports normally her blood pressure is 130/80 when she checks it and she is compliant with her losartan  and atenolol .  She did wear a Holter monitor related to these events and they told her she had brief episodes of SVT but no other findings.  She denies any palpitations during the event and currently reports feels better than she did earlier.  It does not seem to be related to eating and does not occur every time she is active.  She has not had any recent medication changes and denies any recent illnesses.  She denies any unilateral numbness or weakness, vertiginous type symptoms, difficulty with speech or vision.  The history is provided by the patient.  Dizziness      Prior to Admission medications   Medication Sig Start Date End Date Taking? Authorizing Provider  aspirin  EC 81 MG tablet Take 81 mg by mouth daily.   Yes [provider]  atenolol  (TENORMIN ) 25 MG tablet TAKE  1 TABLET BY MOUTH TWICE A DAY 05/30/24  Yes Randol Dawes, MD  atorvastatin  (LIPITOR) 40 MG tablet Take 1 tablet (40 mg total) by mouth daily. 06/22/24  Yes Randol Dawes, MD  buPROPion  (WELLBUTRIN  XL) 150 MG 24 hr tablet Take 300 mg by mouth every morning. 02/23/20  Yes [provider]  Calcium  Carbonate (CALCIUM  500 PO) Take 1 tablet by mouth daily.   Yes [provider]  cholecalciferol  (VITAMIN D3) 25 MCG (1000 UNIT) tablet Take 2,000 Units by mouth daily.   Yes [provider]  cyanocobalamin 100 MCG tablet Take 1,000 mcg by mouth daily.   Yes [provider]  hydrocortisone  (ANUSOL -HC) 2.5 % rectal cream Place 1 Application rectally 2 (two) times daily. Patient taking differently: Place 1 Application rectally 2 (two) times daily as needed. 05/29/24  Yes [place], PA-C  hydrocortisone  (ANUSOL -HC) 25 MG suppository Place 1 suppository (25 mg total) rectally at bedtime. Patient taking differently: Place 25 mg rectally at bedtime as needed. 05/29/24  Yes [place], PA-C  letrozole  (FEMARA ) 2.5 MG tablet TAKE 1 TABLET BY MOUTH EVERY DAY 11/29/23  Yes Gudena, Vinay, MD  losartan  (COZAAR ) 50 MG tablet TAKE 1 TABLET BY MOUTH EVERY DAY 04/23/24  Yes Randol Dawes, MD  polyethylene glycol (MIRALAX / GLYCOLAX) 17 g packet Take 17 g by mouth daily.   Yes [provider]  Allergies: Patient has no known allergies.    Review of Systems  Neurological:  Positive for dizziness.    Updated Vital Signs BP (!) 152/76   Pulse 63   Temp 98.1 F (36.7 C)   Resp 18   Ht 5' 4 (1.626 m)   Wt 65 kg   SpO2 100%   BMI 24.60 kg/m   Physical Exam Vitals and nursing note reviewed.  Constitutional:      General: She is not in acute distress.    Appearance: She is well-developed.  HENT:     Head: Normocephalic and atraumatic.  Eyes:     Pupils: Pupils are equal, round, and reactive to light.  Cardiovascular:     Rate and Rhythm: Normal rate and regular rhythm.      Pulses: Normal pulses.     Heart sounds: Normal heart sounds. No murmur heard.    No friction rub.  Pulmonary:     Effort: Pulmonary effort is normal.     Breath sounds: Normal breath sounds. No wheezing or rales.  Abdominal:     General: Bowel sounds are normal. There is no distension.     Palpations: Abdomen is soft.     Tenderness: There is no abdominal tenderness. There is no guarding or rebound.  Musculoskeletal:        General: No tenderness. Normal range of motion.     Right lower leg: No edema.     Left lower leg: No edema.     Comments: No edema  Skin:    General: Skin is warm and dry.     Findings: No rash.  Neurological:     Mental Status: She is alert and oriented to person, place, and time. Mental status is at baseline.     Cranial Nerves: No cranial nerve deficit, dysarthria or facial asymmetry.     Sensory: No sensory deficit.     Motor: No weakness or pronator drift.     Coordination: Coordination normal.     Gait: Gait normal.  Psychiatric:        Behavior: Behavior normal.     (all labs ordered are listed, but only abnormal results are displayed) Labs Reviewed  I-STAT CHEM 8, ED - Abnormal; Notable for the following components:      Result Value   Glucose, Bld 100 (*)    Calcium , Ion 1.13 (*)    All other components within normal limits  PROTIME-INR  APTT  CBC  DIFFERENTIAL  COMPREHENSIVE METABOLIC PANEL WITH GFR  ETHANOL  CBG MONITORING, ED  TROPONIN I (HIGH SENSITIVITY)  TROPONIN I (HIGH SENSITIVITY)    EKG: EKG Interpretation Date/Time:  Saturday June 30 2024 13:25:05 EDT Ventricular Rate:  72 PR Interval:  170 QRS Duration:  78 QT Interval:  378 QTC Calculation: 413 R Axis:   12  Text Interpretation: Normal sinus rhythm Normal ECG When compared with ECG of 18-Jun-2022 13:30, PREVIOUS ECG IS PRESENT No significant change since last tracing Confirmed by Doretha Folks ([id number]) on 06/30/2024 3:50:13 PM  Radiology: CT HEAD WO  CONTRAST Result Date: 06/30/2024 CLINICAL DATA:  Vertigo. EXAM: CT HEAD WITHOUT CONTRAST TECHNIQUE: Contiguous axial images were obtained from the base of the skull through the vertex without intravenous contrast. RADIATION DOSE REDUCTION: This exam was performed according to the departmental dose-optimization program which includes automated exposure control, adjustment of the mA and/or kV according to patient size and/or use of iterative reconstruction technique. COMPARISON:  12/19/2013 FINDINGS: Brain: No evidence of  intracranial hemorrhage, acute infarction, hydrocephalus, extra-axial collection, or mass lesion/mass effect. Vascular:  No hyperdense vessel or other acute findings. Skull: No evidence of fracture or other significant bone abnormality. Sinuses/Orbits:  No acute findings. Other: None. IMPRESSION: Negative noncontrast head CT. Electronically Signed   By: Norleen DELENA Kil M.D.   On: 06/30/2024 15:20   DG Chest 2 View Result Date: 06/30/2024 EXAM: 2 VIEW(S) XRAY OF THE CHEST 06/30/2024 02:14:00 PM COMPARISON: 12/19/2013 CLINICAL HISTORY: CP. Reason for exam: Chest pain. Per note: Pt reports weakness and dizziness a few days ago. Around 9 today, pt felt very weak. Pt checked BP at home and was 170 systolic. Hx of HTN. Reports that she had intermittent weakness episodes over past few months and wore Holter monitor that showed intermittent SVT. FINDINGS: LUNGS AND PLEURA: No focal pulmonary opacity. No pulmonary edema. No pleural effusion. No pneumothorax. HEART AND MEDIASTINUM: No acute abnormality of the cardiac and mediastinal silhouettes. BONES AND SOFT TISSUES: Left axillary surgical clips noted. Multilevel degenerative changes of thoracic spine. No acute osseous abnormality. IMPRESSION: 1. No acute cardiopulmonary pathology. 2. Left axillary surgical clips. 3. Multilevel degenerative changes of the thoracic spine. Electronically signed by: Waddell Calk MD 06/30/2024 02:50 PM EDT RP Workstation:  [id number]     Procedures   Medications Ordered in the ED - No data to display                                  Medical Decision Making Amount and/or Complexity of Data Reviewed External Data Reviewed: notes. Labs: ordered. Decision-making details documented in ED Course. Radiology: ordered and independent interpretation performed. Decision-making details documented in ED Course. ECG/medicine tests: ordered and independent interpretation performed. Decision-making details documented in ED Course.   Pt with multiple medical problems and comorbidities and presenting today with a complaint that caries a high risk for morbidity and mortality.  Patient presenting today with several weeks of symptoms as described above.  Patient symptoms are not classic for stroke or TIA based on the way she describes them.  More concern for cardiac etiology as they seem to only occur when she is active and have symptoms of possible near syncope with a little bit of chest tightness.  She has worn a Holter monitor that did not show any type of dysrhythmia.  Patient's EKG which I independently interpreted today does not show any sign of acute changes no findings concerning for ST elevation or MI.  Patient recently had thyroid  levels checked with her PCP and reported everything was normal.  She is compliant with her blood pressure medication although blood pressure does seem elevated today.  Patient checks it at home and states it is usually 130/80.  Low suspicion for infectious etiology or PE as patient is low risk Wells criteria.  I independently interpreted patient's labs which show a normal troponin at 3 and 4, CBC, CMP without acute findings.  I have independently visualized and interpreted pt's images today.  Chest x-ray within normal limits.  Low suspicion for pericardial effusion, tamponade, dissection or pericarditis.  All the findings discussed with her patient and her daughter.  Offered admission versus close  follow-up with cardiology and the patient would like to go home after shared decision making.  Did discuss she could increase her losartan  to 100 mg if she had persistently elevated blood pressure.  She will also call her doctor on Monday.  Ambulatory referral for cardiology  was also placed.  She was given return precautions.  They are comfortable with this plan.       Final diagnoses:  Near syncope  Feeling of chest tightness  Hypertension, unspecified type    ED Discharge Orders          Ordered    Ambulatory referral to Cardiology       Comments: If you do not hear from them by Monday give them a call Tuesday and let them know that you are in the emergency room and you need a follow-up appointment   06/30/24 1751               Doretha Folks, MD 06/30/24 1811

## 2024-06-30 NOTE — ED Triage Notes (Signed)
 Pt reports weakness and dizziness a few days ago. Around 9 today, pt felt very weak. Pt checked BP at home and was 170 systolic. Hx of HTN. Reports that she had intermittent weakness episodes over past few months and wore Holter monitor that showed intermittent SVT.

## 2024-07-02 ENCOUNTER — Ambulatory Visit (INDEPENDENT_AMBULATORY_CARE_PROVIDER_SITE_OTHER): Admitting: Family Medicine

## 2024-07-02 ENCOUNTER — Encounter: Payer: Self-pay | Admitting: Family Medicine

## 2024-07-02 VITALS — BP 154/94 | HR 64 | Ht 64.0 in | Wt 142.2 lb

## 2024-07-02 DIAGNOSIS — I10 Essential (primary) hypertension: Secondary | ICD-10-CM | POA: Diagnosis not present

## 2024-07-02 DIAGNOSIS — F4321 Adjustment disorder with depressed mood: Secondary | ICD-10-CM

## 2024-07-02 NOTE — Patient Instructions (Signed)
 Follow up as scheduled with cardiology.  Check blood pressure up to 2 times daily (allowing for a second check after 5 minutes of relaxing if elevated on the first check). Record these with any comments (headache, chest pain, dizziness, palpitations). Bring your record to the cardiology visit with you.  Only take the extra 1/2 tablet of losartan  if your BP remains >180/90 (if it is coming down, and you are feeling better, no need to take it).  Please be sure to utilize your breathing and relaxation techniques (as taught by your therapist) whenever you feel anxious, have a headache, or feel like something isn't right (panicky).  Talk with your psychiatrist about whether you should be taking your buspar on a regular basis (We had briefly mentioned before about taking 1/2 tablet twice daily, if okay with them, and titrating up if you tolerate it, since you previously felt loopy from this medication). I encourage you to see your therapist more frequently.

## 2024-07-02 NOTE — Progress Notes (Signed)
 Chief Complaint  Patient presents with   Hospitalization Follow-up    Hospital follow up. Hypertension and chest tightness. Seeing cardiology Thursday.    Carol Harris is a 68 year old female with hypertension who presents with episodes of dizziness, weakness, and elevated blood pressure.  She experiences intermittent dizziness and weakness, with a significant episode on September 20th after a birthday party, where she felt faint and appeared pale. Her blood pressure readings at home have been elevated, with BP's in the 160's/90's on 9/20. She had taken 10 mg of Buspar on the morning of the party (5mg  at 9am, and a second 5 mg 30 mins later)--she hadn't been taking this medication, prescribed a while ago by her psych, as she recalled it made her feel loopy in the past. Her daughter suggested taking it that morning.  BP was down to 149/88 30 minutes after the 2nd Buspar dose.  It was around 1:30 (after the party) that she felt weak and faint, and her other daughter brought her to the ER.  BP was 152/76 in the ER. ER evaluation was unremarkable (head CT, CXR, labs). She was advised to take an extra dose of losartan  if BP remained elevated, and she was referred to cardiology--has an appointment later this week.  Yesterday she experienced some chest discomfort, and higher BP's. When it got up to 183/96 she took an extra 25 mg of losartan . Repeat BP improved to 153/87 at 10:45pm.   This morning--felt fine when she woke up at 5:30.  At some point she felt like something wasn't right, felt anxious, BP was 164/99. She took her usual dose of losartan  50mg  and also took buspar 5 mg. She went back to bed, kept her feet elevated.  The feeling passed.  F/u BP was BP was 151/85 an hour later, then 134/79, 134/88 by 8:30am 140/88 at noon Feels okay now. Currently, she has no dizziness, weakness, nausea, headache, chest pain, or palpitations, but reports a mild headache.  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  She reports she decided she isn't going to take the job. The timing isn't good. Feeling stressed.  Next week will be the 1 year anniversary from her husband passing away. She became tearful in discussing this. She had been seeing her therapist every 2-4 weeks, but she took an extended vacation, and she hasn't seen the therapist in a couple of months. She has an appointment next week. She hasn't made any progress on the house. She has followup with psychiatrist next month as well.  BP Readings from Last 3 Encounters:  07/02/24 (!) 150/90  06/30/24 (!) 152/76  06/20/24 130/74     PMH, PSH, SH reviewed   Outpatient Encounter Medications as of 07/02/2024  Medication Sig Note   aspirin  EC 81 MG tablet Take 81 mg by mouth daily.    atenolol  (TENORMIN ) 25 MG tablet TAKE 1 TABLET BY MOUTH TWICE A DAY    atorvastatin  (LIPITOR) 40 MG tablet Take 1 tablet (40 mg total) by mouth daily.    buPROPion  (WELLBUTRIN  XL) 150 MG 24 hr tablet Take 300 mg by mouth every morning.    Calcium  Carbonate (CALCIUM  500 PO) Take 1 tablet by mouth daily. 06/01/2023: Plant based   cholecalciferol  (VITAMIN D3) 25 MCG (1000 UNIT) tablet Take 2,000 Units by mouth daily.    cyanocobalamin 100 MCG tablet Take 1,000 mcg by mouth daily.    hydrocortisone  (ANUSOL -HC) 2.5 % rectal cream Place 1 Application  rectally 2 (two) times daily. 07/02/2024: As needed   hydrocortisone  (ANUSOL -HC) 25 MG suppository Place 1 suppository (25 mg total) rectally at bedtime. 07/02/2024: As needed   letrozole  (FEMARA ) 2.5 MG tablet TAKE 1 TABLET BY MOUTH EVERY DAY    losartan  (COZAAR ) 50 MG tablet TAKE 1 TABLET BY MOUTH EVERY DAY 07/02/2024: Took an additional 25mg  last night   polyethylene glycol (MIRALAX / GLYCOLAX) 17 g packet Take 17 g by mouth daily. 06/20/2024: Every morning   No facility-administered encounter medications on file as of 07/02/2024.   No Known Allergies  ROS: No f/c. Not dizzy or weak  currently. Mild HA currently, no chest pain No SOB, no palpitations. No n/v/d. +anxiety, grief    PHYSICAL EXAM:  BP (!) 150/90   Pulse 64   Ht 5' 4 (1.626 m)   Wt 142 lb 3.2 oz (64.5 kg)   BMI 24.41 kg/m   Wt Readings from Last 3 Encounters:  07/02/24 142 lb 3.2 oz (64.5 kg)  06/30/24 143 lb 4.8 oz (65 kg)  06/20/24 142 lb 3.2 oz (64.5 kg)   152/90 on repeat by MD  Pleasant female.  She isn't in any distress. She became somewhat tearful in discussing the anniversary of her husband's death, and appeared anxious in discussing her training for her new job. Full range of affect. Normal eye contact and speech. HEENT: conjunctiva and sclera are clear, EOMI Neck: no lymphadenopathy or mass Heart: regular rate and rhythm Lungs: clear bilaterally Extremities: no edema Neuro: alert and oriented, cranial nerves grossly intact. Normal gait.    ASSESSMENT/PLAN:  Essential hypertension - intermittently elevated BP's recently. Ddx reviewed. Suspect that anxiety and grief are contributing. F/u with therapist and psychiatrist  Grief - next week is the anniversary of her husband's death. Encouraged ongoing counseling  BP's have been high, contributed by stress and anxiety.  Checking BP's very frequently, going up, might contribute to anxiety. To check only 2x/d, repeating if high initially. If BP's consistently very high, can take extra 1/2 tablet of losartan  as recommended by ER doctor, but discussed that ultimately prn dosing of losartan  isn't the plan. She had some normal/lower BP readings, wouldn't want her increasing her dose at the moment. Continue to monitor.  Stress is a contributing factor for rise in BP. Continue low Na diet. Follow up as scheduled with cardiology, bringing list of BP's to visit.   I spent 55 minutes dedicated to the care of this patient, including pre-visit review of records, face to face time, post-visit ordering of testing and documentation.  Check  blood pressure up to 2 times daily (allowing for a second check after 5 minutes of relaxing if elevated on the first check). Record these with any comments (headache, chest pain, dizziness, palpitations). Bring your record to the cardiology visit with you.  Only take the extra 1/2 tablet of losartan  if your BP remains >180/90 (if it is coming down, and you are feeling better, no need to take it).  Please be sure to utilize your breathing and relaxation techniques (as taught by your therapist) whenever you feel anxious, have a headache, or feel like something isn't right (panicky).  Talk with your psychiatrist about whether you should be taking your buspar on a regular basis (We had briefly mentioned before about taking 1/2 tablet twice daily, if okay with them, and titrating up if you tolerate it, since you previously felt loopy from this medication). I encourage you to see your therapist more frequently.

## 2024-07-04 DIAGNOSIS — F4320 Adjustment disorder, unspecified: Secondary | ICD-10-CM | POA: Diagnosis not present

## 2024-07-04 DIAGNOSIS — Z634 Disappearance and death of family member: Secondary | ICD-10-CM | POA: Diagnosis not present

## 2024-07-05 ENCOUNTER — Encounter: Payer: Self-pay | Admitting: Cardiology

## 2024-07-05 ENCOUNTER — Ambulatory Visit: Attending: Cardiology | Admitting: Cardiology

## 2024-07-05 VITALS — BP 148/88 | HR 64 | Resp 16 | Ht 64.0 in | Wt 141.6 lb

## 2024-07-05 DIAGNOSIS — I471 Supraventricular tachycardia, unspecified: Secondary | ICD-10-CM

## 2024-07-05 DIAGNOSIS — R42 Dizziness and giddiness: Secondary | ICD-10-CM | POA: Diagnosis not present

## 2024-07-05 DIAGNOSIS — R072 Precordial pain: Secondary | ICD-10-CM | POA: Diagnosis not present

## 2024-07-05 NOTE — Progress Notes (Signed)
 Cardiology Office Note:    NAME:  Carol Harris    MRN: [id number] DOB:  May 16, 1956   PCP:  Randol Dawes, MD  Former Cardiology Providers: None Primary Cardiologist:  Madonna Large, DO, [facility] (established care 07/05/2024) Electrophysiologist:  None   Referring MD: Randol Dawes, MD  Reason of Consult: Supraventricular tachycardia  Chief Complaint  Patient presents with   Feeling of chest tightness   Near Syncope   New Patient (Initial Visit)    History of Present Illness:    Carol Harris is a 68 y.o. Caucasian female whose past medical history and cardiovascular risk factors includes: Hypertension, sleep apnea on CPAP, HLD, hx of stroke 2014, left breast cancer . She is being seen today for the evaluation of supraventricular tachycardia at the request of Randol Dawes, MD.  After her visit with PCP she had a cardiac monitor placed which noted underlying rhythm to be sinus, average heart rate 66 bpm, paroxysmal episodes of supraventricular tachycardia suggestive of atrial tachycardia.  She had patient triggered events with were associated with sinus rhythm+/- atrial tach.  She was referred to cardiology for further evaluation and management.  Patient states her palpitations are overall well-controlled.  She has been endorsing chest tightness. Prior to March 2025 Substernally located. Intensity 4 out of 10. Occurs twice a month. Last episode was years the night. Worse with stressful situations. No improving factors. Self-limited. Nonradiating  Current Medications: Current Meds  Medication Sig   aspirin  EC 81 MG tablet Take 81 mg by mouth daily.   atenolol  (TENORMIN ) 25 MG tablet TAKE 1 TABLET BY MOUTH TWICE A DAY   atorvastatin  (LIPITOR) 40 MG tablet Take 1 tablet (40 mg total) by mouth daily.   buPROPion  (WELLBUTRIN  XL) 150 MG 24 hr tablet Take 300 mg by mouth every morning.   Calcium  Carbonate (CALCIUM  500 PO) Take 1 tablet by mouth daily.   cholecalciferol   (VITAMIN D3) 25 MCG (1000 UNIT) tablet Take 2,000 Units by mouth daily.   cyanocobalamin 100 MCG tablet Take 1,000 mcg by mouth daily.   hydrocortisone  (ANUSOL -HC) 2.5 % rectal cream Place 1 Application rectally 2 (two) times daily.   hydrocortisone  (ANUSOL -HC) 25 MG suppository Place 1 suppository (25 mg total) rectally at bedtime.   letrozole  (FEMARA ) 2.5 MG tablet TAKE 1 TABLET BY MOUTH EVERY DAY   losartan  (COZAAR ) 50 MG tablet TAKE 1 TABLET BY MOUTH EVERY DAY   polyethylene glycol (MIRALAX / GLYCOLAX) 17 g packet Take 17 g by mouth daily.     Allergies:    Patient has no known allergies.   Past Medical History: Past Medical History:  Diagnosis Date   Anxiety    Cancer (HCC) 06/2022   Colon polyp    Depression    Managed with Welbutrin   GERD (gastroesophageal reflux disease)    HTN (hypertension), benign 09/05/2011   Hyperlipidemia, mixed 09/05/2011   Leukopenia 09/05/2011   Sleep apnea    Stroke (HCC) 2015   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   no defecits    Past Surgical History: Past Surgical History:  Procedure Laterality Date   BREAST LUMPECTOMY WITH RADIOACTIVE SEED AND SENTINEL LYMPH NODE BIOPSY Left 06/25/2022   Procedure: LEFT BREAST LUMPECTOMY WITH RADIOACTIVE SEED AND SENTINEL LYMPH NODE BIOPSY;  Surgeon: Curvin Deward MOULD, MD;  Location: [facility];  Service: General;  Laterality: Left;   KNEE ARTHROSCOPY Right 2005 approx   torn meniscus   TUBAL LIGATION     twice (first  failed)   uterine ablation      Social History: Social History   Tobacco Use   Smoking status: Never   Smokeless tobacco: Never  Vaping Use   Vaping status: Never Used  Substance Use Topics   Alcohol use: Yes    Alcohol/week: 1.0 standard drink of alcohol    Types: 1 Glasses of wine per week    Comment: 2 drinks/month   Drug use: No    Family History: Family History  Problem Relation Age of Onset   Hypertension Mother    Uterine cancer Mother    Colon  cancer Mother 17   Cancer Mother    Early death Mother    Hypertension Father    Hyperlipidemia Father    Stroke Father        multiple and TIA's   Sleep apnea Father    Uterine cancer Sister    Hyperlipidemia Sister    Hypertension Sister    Endometriosis Sister    Hyperlipidemia Sister    Acute myelogenous leukemia Brother        s/p stem cell transplant (in 44's)   Bipolar disorder Daughter        schizo affective    ROS:   Review of Systems  Constitutional: Positive for malaise/fatigue.  Cardiovascular:  Positive for chest pain. Negative for claudication, irregular heartbeat, leg swelling, near-syncope, orthopnea, palpitations, paroxysmal nocturnal dyspnea and syncope.  Respiratory:  Positive for shortness of breath.   Hematologic/Lymphatic: Negative for bleeding problem.    EKGs/Labs/Other Studies Reviewed:   EKG: EKG Interpretation Date/Time:  Thursday July 05 2024 09:02:54 EDT Ventricular Rate:  65 PR Interval:  160 QRS Duration:  78 QT Interval:  388 QTC Calculation: 403 R Axis:   -10  Text Interpretation: Normal sinus rhythm Minimal voltage criteria for LVH, may be normal variant ( R in aVL ) When compared with ECG of 30-Jun-2024 13:25, No significant change was found Confirmed by Michele Richardson [phone]) on 07/05/2024 9:21:58 AM  Cardiac monitor Feb/March 2025 HR 46 - 188, average 66 bpm. 6 nonsustained SVT, longest 17 beats. Rhythm strip suggests AT. Rare supraventricular and ventricular ectopy. No atrial fibrillation. Symptom trigger episodes correspond to sinus rhythm +/- AT.   Ole T. Cindie, MD, [facility], [facility] Cardiac Electrophysiology  Labs:    Latest Ref Rng & Units 06/30/2024    1:44 PM 06/30/2024    1:40 PM 06/20/2024   10:02 AM  CBC  WBC 4.0 - 10.5 K/uL  5.9  4.8   Hemoglobin 12.0 - 15.0 g/dL 86.0  86.3  86.2   Hematocrit 36.0 - 46.0 % 41.0  40.3  41.9   Platelets 150 - 400 K/uL  281  248        Latest Ref Rng & Units 06/30/2024     1:44 PM 06/30/2024    1:40 PM 06/20/2024   10:02 AM  BMP  Glucose 70 - 99 mg/dL 899  99  93   BUN 8 - 23 mg/dL 11  22  10    Creatinine 0.44 - 1.00 mg/dL 9.19  9.20  9.29   BUN/Creat Ratio 12 - 28   14   Sodium 135 - 145 mmol/L 136  135  135   Potassium 3.5 - 5.1 mmol/L 3.9  3.8  4.5   Chloride 98 - 111 mmol/L 99  100  98   CO2 22 - 32 mmol/L  24  23   Calcium  8.9 - 10.3 mg/dL  9.3  9.6  Latest Ref Rng & Units 06/30/2024    1:44 PM 06/30/2024    1:40 PM 06/20/2024   10:02 AM  CMP  Glucose 70 - 99 mg/dL 899  99  93   BUN 8 - 23 mg/dL 11  22  10    Creatinine 0.44 - 1.00 mg/dL 9.19  9.20  9.29   Sodium 135 - 145 mmol/L 136  135  135   Potassium 3.5 - 5.1 mmol/L 3.9  3.8  4.5   Chloride 98 - 111 mmol/L 99  100  98   CO2 22 - 32 mmol/L  24  23   Calcium  8.9 - 10.3 mg/dL  9.3  9.6   Total Protein 6.5 - 8.1 g/dL  7.4  7.4   Total Bilirubin 0.0 - 1.2 mg/dL  0.8  0.7   Alkaline Phos 38 - 126 U/L  60  77   AST 15 - 41 U/L  20  20   ALT 0 - 44 U/L  20  19     Lab Results  Component Value Date   CHOL 146 06/20/2024   HDL 61 06/20/2024   LDLCALC 71 06/20/2024   TRIG 68 06/20/2024   CHOLHDL 2.4 06/20/2024   No results for input(s): LIPOA in the last 8760 hours. No components found for: NTPROBNP No results for input(s): PROBNP in the last 8760 hours. Recent Labs    06/20/24 1006  TSH 0.532    Physical Exam:    Today's Vitals   07/05/24 0900  BP: (!) 148/88  Pulse: 64  Resp: 16  SpO2: 98%  Weight: 141 lb 9.6 oz (64.2 kg)  Height: 5' 4 (1.626 m)   Body mass index is 24.31 kg/m. Wt Readings from Last 3 Encounters:  07/05/24 141 lb 9.6 oz (64.2 kg)  07/02/24 142 lb 3.2 oz (64.5 kg)  06/30/24 143 lb 4.8 oz (65 kg)    Physical Exam  Constitutional: No distress.  hemodynamically stable  Neck: No JVD present.  Cardiovascular: Normal rate, regular rhythm, S1 normal and S2 normal. Exam reveals no gallop, no S3 and no S4.  No murmur heard. Pulmonary/Chest:  Effort normal and breath sounds normal. No stridor. She has no wheezes. She has no rales.  Musculoskeletal:        General: No edema.     Cervical back: Neck supple.  Skin: Skin is warm.     Impression & Recommendation(s):  Impression:   ICD-10-CM   1. PSVT (paroxysmal supraventricular tachycardia)  I47.10 EKG 12-Lead    2. Precordial pain  R07.2 EKG 12-Lead    ECHOCARDIOGRAM COMPLETE    MYOCARDIAL PERFUSION IMAGING    3. Dizziness  R42        Recommendation(s):  PSVT (paroxysmal supraventricular tachycardia) Noted on cardiac monitor. States that she is relatively stable. Continue atenolol  25 mg p.o. daily  Precordial pain Symptoms are predominately noncardiac. EKG is nonischemic, LVH criteria. Echo will be ordered to evaluate for structural heart disease and left ventricular systolic function. Exercise nuclear stress test recommended given the degree of LVH Further recommendations to follow  Dizziness Orthostatic vital signs are negative.  Change positions slowly. Reemphasize importance of increasing hydration and eating 3 heart healthy balanced meals per day. Try compression stockings and recumbent exercises. Defer noncardiac workup to primary team.  Orders Placed:  Orders Placed This Encounter  Procedures   MYOCARDIAL PERFUSION IMAGING    Standing Status:   Future    Expiration Date:   07/05/2025  [facility] this test [facility] performed[facility]    Type of stress:   Exercise    Patient weight in lbs:   141   EKG 12-Lead   ECHOCARDIOGRAM COMPLETE    Standing Status:   Future    Expiration Date:   07/05/2025    Where should this test be performed:   [facility]    Does the patient weigh less than or greater than 250 lbs?:   Patient weighs less than 250 lbs    Perflutren DEFINITY (image enhancing agent) should be administered unless hypersensitivity or allergy exist:   Administer Perflutren    Reason for exam-Echo:   Chest Pain  R07.9      Final Medication List:   No orders of the defined types were placed in this encounter.   There are no discontinued medications.   Current Outpatient Medications:    aspirin  EC 81 MG tablet, Take 81 mg by mouth daily., Disp: , Rfl:    atenolol  (TENORMIN ) 25 MG tablet, TAKE 1 TABLET BY MOUTH TWICE A DAY, Disp: 180 tablet, Rfl: 0   atorvastatin  (LIPITOR) 40 MG tablet, Take 1 tablet (40 mg total) by mouth daily., Disp: 90 tablet, Rfl: 3   buPROPion  (WELLBUTRIN  XL) 150 MG 24 hr tablet, Take 300 mg by mouth every morning., Disp: , Rfl:    Calcium  Carbonate (CALCIUM  500 PO), Take 1 tablet by mouth daily., Disp: , Rfl:    cholecalciferol  (VITAMIN D3) 25 MCG (1000 UNIT) tablet, Take 2,000 Units by mouth daily., Disp: , Rfl:    cyanocobalamin 100 MCG tablet, Take 1,000 mcg by mouth daily., Disp: , Rfl:    hydrocortisone  (ANUSOL -HC) 2.5 % rectal cream, Place 1 Application rectally 2 (two) times daily., Disp: 30 g, Rfl: 1   hydrocortisone  (ANUSOL -HC) 25 MG suppository, Place 1 suppository (25 mg total) rectally at bedtime., Disp: 12 suppository, Rfl: 1   letrozole  (FEMARA ) 2.5 MG tablet, TAKE 1 TABLET BY MOUTH EVERY DAY, Disp: 90 tablet, Rfl: 3   losartan  (COZAAR ) 50 MG tablet, TAKE 1 TABLET BY MOUTH EVERY DAY, Disp: 90 tablet, Rfl: 0   polyethylene glycol (MIRALAX / GLYCOLAX) 17 g packet, Take 17 g by mouth daily., Disp: , Rfl:   Consent:   Informed Consent   Shared Decision Making/Informed Consent The risks [chest pain, shortness of breath, cardiac arrhythmias, dizziness, blood pressure fluctuations, myocardial infarction, stroke/transient ischemic attack, nausea, vomiting, allergic reaction, radiation exposure, metallic taste sensation and life-threatening complications (estimated to be 1 in 10,000)], benefits (risk stratification, diagnosing coronary artery disease, treatment guidance) and alternatives of a nuclear stress test were discussed in detail with Ms. Buboltz and she agrees to  proceed.     Disposition:   If workup is negative, 1 year follow-up or as needed basis. Patient may be asked to follow-up sooner based on the results of the above-mentioned testing.  Her questions and concerns were addressed to her satisfaction. She voices understanding of the recommendations provided during this encounter.    Signed, Madonna Michele HAS, [facility]  A Division of [facility] [address], [place], [place] [address]  07/08/2024 1:00 PM

## 2024-07-05 NOTE — Patient Instructions (Addendum)
 Medication Instructions:   No changes *If you need a refill on your cardiac medications before your next appointment, please call your pharmacy*   Lab Work: Not needed    Testing/Procedures: 1) Your physician has requested that you have an echocardiogram. Echocardiography is a painless test that uses sound waves to create images of your heart. It provides your doctor with information about the size and shape of your heart and how well your heart's chambers and valves are working. This procedure takes approximately one hour. There are no restrictions for this procedure. Please do NOT wear cologne, perfume, aftershave, or lotions (deodorant is allowed). Please arrive 15 minutes prior to your appointment time.  Please note: We ask at that you not bring children with you during ultrasound (echo/ vascular) testing. Due to room size and safety concerns, children are not allowed in the ultrasound rooms during exams. Our front office staff cannot provide observation of children in our lobby area while testing is being conducted. An adult accompanying a patient to their appointment will only be allowed in the ultrasound room at the discretion of the ultrasound technician under special circumstances. We apologize for any inconvenience.       2)Your doctor has scheduled you for a Myocardial Perfusion scan on to obtain information about the blood flow to your heart. The test consists of taking pictures of your heart in two phases: while resting and after a stress test.  The stress test may involve walking on a treadmill, or if you are unable to exercise adequately, you will be given a drug intended to have a similar effect on the heart to that of exercise.  The test will take approximately 3 to 4  hours to complete.   How to prepare for your test: Do not eat or drink 2 hours prior to your test Do not consume products containing caffeine 12 hours prior to your test (examples: coffee (regular OR decaf),  chocolate, sodas, tea) Your doctor may need you to hold certain medications prior to the test.  If so, these are listed below and should not be taken for 24 hours prior to the test.  If not listed below, you may take your medications as normal.  You may resume taking held medications on your normal schedule once the test is complete.   Meds to hold:  Atenolol   ( hold the day before and the day of procedure) Do bring a list of your current medications with you.  If you have held any meds in preparation for the test, please bring them, as you may be required to take them once the test is completed. Do wear comfortable clothes and walking shoes.  Do not wear dresses or overalls. Do NOT wear cologne, perfume, aftershave, or fragranced lotions the day of your test (deodorants okay). If these instructions are not followed your test will have to be rescheduled.   A nuclear cardiologist will review your test, prepare a report and send it to your physician.   If you have questions or concerns about your appointment, you can call the Nuclear Cardiology department at [phone]. If you cannot keep your appointment, please provide 48 hours notification to avoid a possible $50.00 charge to your account.   Please arrive 15 minutes prior to your appointment time for registration and insurance purposes   Follow-Up: At [facility], you and your health needs are our priority.  As part of our continuing mission to provide you with exceptional heart care, we  have created designated Provider Care Teams.  These Care Teams include your primary Cardiologist (physician) and Advanced Practice Providers (APPs -  Physician Assistants and Nurse Practitioners) who all work together to provide you with the care you need, when you need it.     Your next appointment:   12 month(s) or as needed  The format for your next appointment:   In Person  Provider:   Madonna Large, DO   Other Instructions

## 2024-07-08 ENCOUNTER — Encounter: Payer: Self-pay | Admitting: Cardiology

## 2024-07-09 ENCOUNTER — Inpatient Hospital Stay: Attending: Hematology and Oncology | Admitting: Hematology and Oncology

## 2024-07-09 VITALS — BP 128/80 | HR 65 | Temp 97.6°F | Resp 17 | Wt 143.5 lb

## 2024-07-09 DIAGNOSIS — Z79811 Long term (current) use of aromatase inhibitors: Secondary | ICD-10-CM | POA: Diagnosis not present

## 2024-07-09 DIAGNOSIS — Z1732 Human epidermal growth factor receptor 2 negative status: Secondary | ICD-10-CM | POA: Insufficient documentation

## 2024-07-09 DIAGNOSIS — Z923 Personal history of irradiation: Secondary | ICD-10-CM | POA: Diagnosis not present

## 2024-07-09 DIAGNOSIS — C50512 Malignant neoplasm of lower-outer quadrant of left female breast: Secondary | ICD-10-CM | POA: Diagnosis not present

## 2024-07-09 DIAGNOSIS — Z1721 Progesterone receptor positive status: Secondary | ICD-10-CM | POA: Insufficient documentation

## 2024-07-09 DIAGNOSIS — Z170 Estrogen receptor positive status [ER+]: Secondary | ICD-10-CM | POA: Insufficient documentation

## 2024-07-09 NOTE — Progress Notes (Signed)
 Patient Care Team: Randol Dawes, MD as PCP - General (Family Medicine) Michele Richardson, DO as PCP - Cardiology (Cardiology) Curvin Deward MOULD, MD as Consulting Physician (General Surgery) Odean Potts, MD as Consulting Physician (Hematology and Oncology) Dewey Rush, MD as Consulting Physician (Radiation Oncology) Cleotilde Sewer, OD (Optometry) Kandyce Sor, MD as Consulting Physician (Obstetrics and Gynecology) Ivin Kocher, MD as Referring Physician (Dermatology)  DIAGNOSIS:  Encounter Diagnosis  Name Primary?   Malignant neoplasm of lower-outer quadrant of left breast of female, estrogen receptor positive (HCC) Yes    SUMMARY OF ONCOLOGIC HISTORY: Oncology History  Malignant neoplasm of lower-outer quadrant of left breast of female, estrogen receptor positive (HCC)  06/16/2022 Initial Diagnosis   Screening mammogram detected left breast mass at 3 o'clock position 0.4 cm, axilla negative, ultrasound-guided biopsy revealed grade 1 IDC with lobular features with DCIS and calcifications ER 95% PR 80% HER2 negative, Ki-67 15%   06/16/2022 Cancer Staging   Staging form: Breast, AJCC 8th Edition - Clinical: Stage IA (cT1a, cN0, cM0, G1, ER+, PR+, HER2-) - Signed by Odean Potts, MD on 06/16/2022 Histologic grading system: 3 grade system    Genetic Testing   [facility] Multi-Cancer Panel was Negative. Of note, a variant of uncertain significance was detected in the SMARCA4 gene. Report date is 03/16/2019.  The Multi-Cancer Panel offered by [facility] includes sequencing and/or deletion/duplication analysis of the following 84 genes:  AIP, ALK, APC, ATM, AXIN2, BAP1, BARD1, BLM, BMPR1A, BRCA1, BRCA2, BRIP1, CASR, CDC73, CDH1, CDK4, CDKN1B, CDKN1C, CDKN2A, CEBPA, CHEK2, CTNNA1, DICER1, DIS3L2, EGFR, EPCAM, FH, FLCN, GATA2, GPC3, GREM1, HOXB13, HRAS, KIT, MAX, MEN1, MET, MITF, MLH1, MSH2, MSH3, MSH6, MUTYH, NBN, NF1, NF2, NTHL1, PALB2, PDGFRA, PHOX2B, PMS2, POLD1, POLE, POT1, PRKAR1A, PTCH1, PTEN,  RAD50, RAD51C, RAD51D, RB1, RECQL4, RET, RUNX1, SDHA, SDHAF2, SDHB, SDHC, SDHD, SMAD4, SMARCA4, SMARCB1, SMARCE1, STK11, SUFU, TERC, TERT, TMEM127, TP53, TSC1, TSC2, VHL, WRN, and WT1.        06/25/2022 Surgery   Left lumpectomy: Grade 2 IDC 1.7 cm, margins negative, 0/5 sentinel nodes negative ER 95%, PR 80%, HER2 negative, Ki-67 15%    06/25/2022 Oncotype testing   Oncotype DX score: 22: 8% risk of distant recurrence   08/17/2022 - 09/14/2022 Radiation Therapy   Site Technique Total Dose (Gy) Dose per Fx (Gy) Completed Fx Beam Energies  Breast, Left: Breast_L 3D 42.56/42.56 2.66 16/16 6XFFF  Breast, Left: Breast_L_Bst 3D 8/8 2 4/4 6X     11/2022 -  Anti-estrogen oral therapy   Letrozole      CHIEF COMPLIANT: Follow-up on letrozole  therapy  HISTORY OF PRESENT ILLNESS:   History of Present Illness Carol Harris is a 68 year old female with a history of breast cancer who presents for a follow-up visit.  She is two years post-surgery for breast cancer and is currently on letrozole  daily without new concerns. She took an extra dose one day without adverse effects. Occasional twinges in the breast area occur, but there is no significant pain or discomfort. A recent mammogram was performed on August 19th.  She experiences chest pain and high anxiety, attributed to the stress of her husband's passing anniversary. A CT scan of her head was done a couple of weeks ago.  She maintains an active lifestyle, having hiked to the [employer] [place] last year, and participates in walking, acting classes, and social gatherings. She has two daughters nearby, a son in [place], and two grandchildren.     ALLERGIES:  has no known allergies.  MEDICATIONS:  Current Outpatient Medications  Medication Sig Dispense Refill   aspirin  EC 81 MG tablet Take 81 mg by mouth daily.     atenolol  (TENORMIN ) 25 MG tablet TAKE 1 TABLET BY MOUTH TWICE A DAY 180 tablet 0   atorvastatin  (LIPITOR) 40 MG  tablet Take 1 tablet (40 mg total) by mouth daily. 90 tablet 3   buPROPion  (WELLBUTRIN  XL) 150 MG 24 hr tablet Take 300 mg by mouth every morning.     Calcium  Carbonate (CALCIUM  500 PO) Take 1 tablet by mouth daily.     cholecalciferol  (VITAMIN D3) 25 MCG (1000 UNIT) tablet Take 2,000 Units by mouth daily.     cyanocobalamin 100 MCG tablet Take 1,000 mcg by mouth daily.     hydrocortisone  (ANUSOL -HC) 2.5 % rectal cream Place 1 Application rectally 2 (two) times daily. 30 g 1   hydrocortisone  (ANUSOL -HC) 25 MG suppository Place 1 suppository (25 mg total) rectally at bedtime. 12 suppository 1   letrozole  (FEMARA ) 2.5 MG tablet TAKE 1 TABLET BY MOUTH EVERY DAY 90 tablet 3   losartan  (COZAAR ) 50 MG tablet TAKE 1 TABLET BY MOUTH EVERY DAY 90 tablet 0   polyethylene glycol (MIRALAX / GLYCOLAX) 17 g packet Take 17 g by mouth daily.     No current facility-administered medications for this visit.    PHYSICAL EXAMINATION: ECOG PERFORMANCE STATUS: 1 - Symptomatic but completely ambulatory  Vitals:   07/09/24 0807  BP: 128/80  Pulse: 65  Resp: 17  Temp: 97.6 F (36.4 C)  SpO2: 100%   Filed Weights   07/09/24 0807  Weight: 143 lb 8 oz (65.1 kg)      LABORATORY DATA:  I have reviewed the data as listed    Latest Ref Rng & Units 06/30/2024    1:44 PM 06/30/2024    1:40 PM 06/20/2024   10:02 AM  CMP  Glucose 70 - 99 mg/dL 899  99  93   BUN 8 - 23 mg/dL 11  22  10    Creatinine 0.44 - 1.00 mg/dL 9.19  9.20  9.29   Sodium 135 - 145 mmol/L 136  135  135   Potassium 3.5 - 5.1 mmol/L 3.9  3.8  4.5   Chloride 98 - 111 mmol/L 99  100  98   CO2 22 - 32 mmol/L  24  23   Calcium  8.9 - 10.3 mg/dL  9.3  9.6   Total Protein 6.5 - 8.1 g/dL  7.4  7.4   Total Bilirubin 0.0 - 1.2 mg/dL  0.8  0.7   Alkaline Phos 38 - 126 U/L  60  77   AST 15 - 41 U/L  20  20   ALT 0 - 44 U/L  20  19     Lab Results  Component Value Date   WBC 5.9 06/30/2024   HGB 13.9 06/30/2024   HCT 41.0 06/30/2024   MCV  92.2 06/30/2024   PLT 281 06/30/2024   NEUTROABS 3.4 06/30/2024    ASSESSMENT & PLAN:  Malignant neoplasm of lower-outer quadrant of left breast of female, estrogen receptor positive (HCC) Screening mammogram detected left breast mass at 3 o'clock position 0.4 cm, axilla negative, ultrasound-guided biopsy revealed grade 1 IDC with lobular features with DCIS and calcifications ER 95% PR 80% HER2 negative, Ki-67 15% 06/25/2022: Left lumpectomy: Grade 2 IDC 1.7 cm, margins negative, 0/5 sentinel nodes negative ER 95%, PR 80%, HER2 negative, Ki-67 15% Oncotype DX score: 22: 8% risk  of distant recurrence Adjuvant radiation completed 09/14/2022   Treatment plan: Adjuvant antiestrogen therapy with anastrozole  1 mg daily started 09/17/2022 discontinued 09/29/2022 (severe jaw pain), switched to letrozole    Letrozole  toxicities: Constipation: Patient had it previously.   Denies any jaw pain or bone pain.   Breast cancer surveillance: Mammogram 05/29/2024 at [facility]: Benign breast density category B   Her husband passed away last year from metastatic cancer. Return to clinic in 1 year for follow-up   Assessment & Plan Estrogen receptor positive malignant neoplasm of lower-outer quadrant of left breast Two years post-surgery, on adjuvant letrozole . No new concerns or side effects, except one-time accidental double dose without adverse effects. Recent mammogram normal, breast density B. Letrozole  reduced breast density. - Continue letrozole  for 5-7 years, depending on bone health. - Order bone density scan to monitor letrozole  effects on bone health. - Schedule routine follow-up mammogram. - Encourage regular exercise.  Anxiety symptoms Experiencing anxiety, possibly related to anniversary of husband's passing. Symptoms include chest pain and high anxiety. Recent CT head performed. - Encourage social activities and support networks.  No orders of the defined types were placed in this  encounter.  The patient has a good understanding of the overall plan. she agrees with it. she will call with any problems that may develop before the next visit here. Total time spent: 30 mins including face to face time and time spent for planning, charting and co-ordination of care   Naomi MARLA Chad, MD 07/09/24

## 2024-07-09 NOTE — Assessment & Plan Note (Signed)
 Screening mammogram detected left breast mass at 3 o'clock position 0.4 cm, axilla negative, ultrasound-guided biopsy revealed grade 1 IDC with lobular features with DCIS and calcifications ER 95% PR 80% HER2 negative, Ki-67 15% 06/25/2022: Left lumpectomy: Grade 2 IDC 1.7 cm, margins negative, 0/5 sentinel nodes negative ER 95%, PR 80%, HER2 negative, Ki-67 15% Oncotype DX score: 22: 8% risk of distant recurrence Adjuvant radiation completed 09/14/2022   Treatment plan: Adjuvant antiestrogen therapy with anastrozole  1 mg daily started 09/17/2022 discontinued 09/29/2022 (severe jaw pain), switched to letrozole    Letrozole  toxicities: Constipation: Patient had it previously.   Denies any jaw pain or bone pain.   Breast cancer surveillance: Breast exam 07/09/2024: Benign Mammogram 05/29/2024 at [facility]: Benign breast density category B Her husband is going through chemotherapy for prostate cancer.  Is getting it with Dr. Deanne   Return to clinic in 1 year for follow-up

## 2024-07-11 DIAGNOSIS — F331 Major depressive disorder, recurrent, moderate: Secondary | ICD-10-CM | POA: Diagnosis not present

## 2024-07-16 ENCOUNTER — Encounter (HOSPITAL_COMMUNITY): Payer: Self-pay

## 2024-07-17 DIAGNOSIS — Z634 Disappearance and death of family member: Secondary | ICD-10-CM | POA: Diagnosis not present

## 2024-07-17 DIAGNOSIS — F4320 Adjustment disorder, unspecified: Secondary | ICD-10-CM | POA: Diagnosis not present

## 2024-07-22 ENCOUNTER — Other Ambulatory Visit: Payer: Self-pay | Admitting: Family Medicine

## 2024-07-22 DIAGNOSIS — I10 Essential (primary) hypertension: Secondary | ICD-10-CM

## 2024-07-23 ENCOUNTER — Ambulatory Visit: Attending: Hematology and Oncology

## 2024-07-23 VITALS — Wt 142.5 lb

## 2024-07-23 DIAGNOSIS — Z483 Aftercare following surgery for neoplasm: Secondary | ICD-10-CM | POA: Insufficient documentation

## 2024-07-23 NOTE — Therapy (Signed)
 OUTPATIENT PHYSICAL THERAPY SOZO SCREENING NOTE   Patient Name: Carol Harris MRN: [id number] DOB:[birth year], 68 y.o., female Today's Date: 07/23/2024  PCP: Randol Dawes, MD REFERRING PROVIDER: Odean Potts, MD   PT End of Session - 07/23/24 0853     Visit Number 13   # unchanged due to screen only   PT Start Time 0851    PT Stop Time 0855    PT Time Calculation (min) 4 min    Activity Tolerance Patient tolerated treatment well    Behavior During Therapy [facility] for tasks assessed/performed          Past Medical History:  Diagnosis Date   Anxiety    Cancer (HCC) 06/2022   Colon polyp    Depression    Managed with Welbutrin   GERD (gastroesophageal reflux disease)    HTN (hypertension), benign 09/05/2011   Hyperlipidemia, mixed 09/05/2011   Leukopenia 09/05/2011   Sleep apnea    Stroke (HCC) 2015   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   no defecits   Past Surgical History:  Procedure Laterality Date   BREAST LUMPECTOMY WITH RADIOACTIVE SEED AND SENTINEL LYMPH NODE BIOPSY Left 06/25/2022   Procedure: LEFT BREAST LUMPECTOMY WITH RADIOACTIVE SEED AND SENTINEL LYMPH NODE BIOPSY;  Surgeon: Curvin Deward MOULD, MD;  Location: [facility];  Service: General;  Laterality: Left;   KNEE ARTHROSCOPY Right 2005 approx   torn meniscus   TUBAL LIGATION     twice (first failed)   uterine ablation     Patient Active Problem List   Diagnosis Date Noted   Vitamin D  deficiency 05/31/2023   Impaired fasting glucose 05/31/2023   Malignant neoplasm of lower-outer quadrant of left breast of female, estrogen receptor positive (HCC) 06/16/2022   Genetic testing 06/16/2022   Restless leg syndrome 11/08/2018   Left ventricular hypertrophy 11/08/2018   Depression 11/03/2018   Family history of colon cancer in mother 11/03/2018   History of vitamin D  deficiency 11/03/2018   Night terrors, adult 11/03/2018   History of CVA (cerebrovascular accident) 07/12/2014   Obstructive  sleep apnea 01/08/2014   GERD (gastroesophageal reflux disease) 12/19/2013   Essential hypertension 09/05/2011   Hyperlipidemia, mixed 09/05/2011    REFERRING DIAG: left breast cancer at risk for lymphedema  THERAPY DIAG: Aftercare following surgery for neoplasm  PERTINENT HISTORY: 06/25/2022: Left lumpectomy: Grade 2 IDC 1.7 cm, margins negative, 0/5 sentinel nodes negative ER 95%, PR 80%, HER2 negative, Ki-67 15%  Adjuvant radiation completed 09/14/2022 No chemo, but is on letrozole , pt did not tolerate anastrozole    PRECAUTIONS: left UE Lymphedema risk, None  SUBJECTIVE: Pt returns for her last 3 month L-Dex screen.   PAIN:  Are you having pain? No  SOZO SCREENING: Patient was assessed today using the SOZO machine to determine the lymphedema index score. This was compared to her baseline score. It was determined that she is within the recommended range when compared to her baseline and no further action is needed at this time. She will continue SOZO screenings. These are done every 3 months for 2 years post operatively followed by every 6 months for 2 years, and then annually.   L-DEX FLOWSHEETS - 07/23/24 0800       L-DEX LYMPHEDEMA SCREENING   Measurement Type Unilateral    L-DEX MEASUREMENT EXTREMITY Upper Extremity    POSITION  Standing    DOMINANT SIDE Right    At Risk Side Left    BASELINE SCORE (UNILATERAL) 1.2  L-DEX SCORE (UNILATERAL) -1.8    VALUE CHANGE (UNILAT) -3          P: Pt to transition to 6 month L-Dex screens now.   Aden Berwyn Caldron, PTA 07/23/2024, 8:55 AM

## 2024-07-24 DIAGNOSIS — M85851 Other specified disorders of bone density and structure, right thigh: Secondary | ICD-10-CM | POA: Diagnosis not present

## 2024-07-24 DIAGNOSIS — M85852 Other specified disorders of bone density and structure, left thigh: Secondary | ICD-10-CM | POA: Diagnosis not present

## 2024-07-30 ENCOUNTER — Other Ambulatory Visit: Payer: Self-pay | Admitting: Cardiology

## 2024-07-30 DIAGNOSIS — R072 Precordial pain: Secondary | ICD-10-CM

## 2024-07-31 ENCOUNTER — Ambulatory Visit (HOSPITAL_COMMUNITY)
Admission: RE | Admit: 2024-07-31 | Discharge: 2024-07-31 | Disposition: A | Discharge status: Home / Self Care | Source: Ambulatory Visit | Attending: Cardiology | Admitting: Cardiology

## 2024-07-31 ENCOUNTER — Ambulatory Visit (HOSPITAL_BASED_OUTPATIENT_CLINIC_OR_DEPARTMENT_OTHER)
Admission: RE | Admit: 2024-07-31 | Discharge: 2024-07-31 | Disposition: A | Discharge status: Home / Self Care | Source: Ambulatory Visit | Attending: Cardiology | Admitting: Cardiology

## 2024-07-31 ENCOUNTER — Ambulatory Visit: Admitting: Physician Assistant

## 2024-07-31 DIAGNOSIS — R072 Precordial pain: Secondary | ICD-10-CM

## 2024-07-31 DIAGNOSIS — I700 Atherosclerosis of aorta: Secondary | ICD-10-CM | POA: Diagnosis not present

## 2024-07-31 LAB — MYOCARDIAL PERFUSION IMAGING
Angina Index: 0
Base ST Depression (mm): 0 mm
Duke Treadmill Score: 9
Estimated workload: 10.1
Exercise duration (min): 9 min
Exercise duration (sec): 9 s
LV dias vol: 72 mL (ref 46–106)
LV sys vol: 20 mL (ref 3.8–5.2)
MPHR: 150 {beats}/min
Nuc Stress EF: 72 %
Peak HR: 130 {beats}/min
Percent HR: 87 %
RPE: 18
Rest HR: 73 {beats}/min
Rest Nuclear Isotope Dose: 10.8 mCi
SDS: 0
SRS: 2
SSS: 0
ST Depression (mm): 0 mm
Stress Nuclear Isotope Dose: 32.4 mCi
TID: 0.98

## 2024-07-31 LAB — ECHOCARDIOGRAM COMPLETE: S' Lateral: 2.58 cm

## 2024-07-31 MED ORDER — TECHNETIUM TC 99M TETROFOSMIN IV KIT
32.4000 | PACK | Freq: Once | INTRAVENOUS | Status: AC | PRN
  Administered 2024-07-31: 32.4 via INTRAVENOUS

## 2024-07-31 MED ORDER — TECHNETIUM TC 99M TETROFOSMIN IV KIT
10.8000 | PACK | Freq: Once | INTRAVENOUS | Status: AC | PRN
  Administered 2024-07-31: 10.8 via INTRAVENOUS

## 2024-08-04 ENCOUNTER — Ambulatory Visit: Payer: Self-pay | Admitting: Cardiology

## 2024-08-09 DIAGNOSIS — F4320 Adjustment disorder, unspecified: Secondary | ICD-10-CM | POA: Diagnosis not present

## 2024-08-09 DIAGNOSIS — Z634 Disappearance and death of family member: Secondary | ICD-10-CM | POA: Diagnosis not present

## 2024-08-17 DIAGNOSIS — F4320 Adjustment disorder, unspecified: Secondary | ICD-10-CM | POA: Diagnosis not present

## 2024-08-17 DIAGNOSIS — Z634 Disappearance and death of family member: Secondary | ICD-10-CM | POA: Diagnosis not present

## 2024-08-25 ENCOUNTER — Other Ambulatory Visit: Payer: Self-pay | Admitting: Family Medicine

## 2024-08-25 DIAGNOSIS — I10 Essential (primary) hypertension: Secondary | ICD-10-CM

## 2024-08-28 DIAGNOSIS — M8580 Other specified disorders of bone density and structure, unspecified site: Secondary | ICD-10-CM | POA: Diagnosis not present

## 2024-08-29 ENCOUNTER — Ambulatory Visit: Admitting: Cardiology

## 2024-08-31 DIAGNOSIS — F4320 Adjustment disorder, unspecified: Secondary | ICD-10-CM | POA: Diagnosis not present

## 2024-08-31 DIAGNOSIS — Z634 Disappearance and death of family member: Secondary | ICD-10-CM | POA: Diagnosis not present

## 2024-09-11 DIAGNOSIS — F331 Major depressive disorder, recurrent, moderate: Secondary | ICD-10-CM | POA: Diagnosis not present

## 2024-09-11 DIAGNOSIS — F410 Panic disorder [episodic paroxysmal anxiety] without agoraphobia: Secondary | ICD-10-CM | POA: Diagnosis not present

## 2024-09-17 DIAGNOSIS — Z634 Disappearance and death of family member: Secondary | ICD-10-CM | POA: Diagnosis not present

## 2024-09-17 DIAGNOSIS — F4320 Adjustment disorder, unspecified: Secondary | ICD-10-CM | POA: Diagnosis not present

## 2024-10-03 ENCOUNTER — Telehealth: Admitting: Family Medicine

## 2024-10-03 ENCOUNTER — Encounter: Payer: Self-pay | Admitting: Family Medicine

## 2024-10-03 ENCOUNTER — Ambulatory Visit: Payer: Self-pay

## 2024-10-03 ENCOUNTER — Other Ambulatory Visit

## 2024-10-03 DIAGNOSIS — J210 Acute bronchiolitis due to respiratory syncytial virus: Secondary | ICD-10-CM | POA: Diagnosis not present

## 2024-10-03 DIAGNOSIS — R051 Acute cough: Secondary | ICD-10-CM | POA: Diagnosis not present

## 2024-10-03 MED ORDER — METHYLPREDNISOLONE 4 MG PO TBPK: ORAL_TABLET | ORAL | 0 refills | Status: AC

## 2024-10-03 NOTE — Telephone Encounter (Signed)
 FYI Only or Action Required?: Action required by provider: request for appointment.  Patient was last seen in primary care on 07/02/2024 by Randol Dawes, MD.  Called Nurse Triage reporting Cough.  Symptoms began yesterday.  Interventions attempted: Rest, hydration, or home remedies.  Symptoms are: unchanged.Cough, chills, runny nose. Exposure to RSV.   Triage Disposition: See Physician Within 24 Hours  Patient/caregiver understands and will follow disposition?: Yes     Copied from CRM #[id number]. Topic: Clinical - Red Word Triage >> Oct 03, 2024  8:16 AM Charlet HERO wrote: Red Word that prompted transfer to Nurse Triage: patient is calling about sore throat , runny nose, cough grandaughter has rsv is concerned. Dr Randol Reason for Disposition  [1] Continuous (nonstop) coughing interferes with work or school AND [2] no improvement using cough treatment per Care Advice  Answer Assessment - Initial Assessment Questions 1. ONSET: When did the cough begin?      yesterday 2. SEVERITY: How bad is the cough today?      moderate 3. SPUTUM: Describe the color of your sputum (e.g., none, dry cough; clear, white, yellow, green)     cream 4. HEMOPTYSIS: Are you coughing up any blood? If Yes, ask: How much? (e.g., flecks, streaks, tablespoons, etc.)     no 5. DIFFICULTY BREATHING: Are you having difficulty breathing? If Yes, ask: How bad is it? (e.g., mild, moderate, severe)      no 6. FEVER: Do you have a fever? If Yes, ask: What is your temperature, how was it measured, and when did it start?     chills 7. CARDIAC HISTORY: Do you have any history of heart disease? (e.g., heart attack, congestive heart failure)      no 8. LUNG HISTORY: Do you have any history of lung disease?  (e.g., pulmonary embolus, asthma, emphysema)     no 9. PE RISK FACTORS: Do you have a history of blood clots? (or: recent major surgery, recent prolonged travel, bedridden)     no 10. OTHER  SYMPTOMS: Do you have any other symptoms? (e.g., runny nose, wheezing, chest pain)       Runny nose 11. PREGNANCY: Is there any chance you are pregnant? When was your last menstrual period?       no 12. TRAVEL: Have you traveled out of the country in the last month? (e.g., travel history, exposures)       no  Protocols used: Cough - Acute Productive-A-AH

## 2024-10-03 NOTE — Progress Notes (Unsigned)
" ° °  Name: Carol Harris   Date of Visit: 10/03/2024   Date of last visit with me: Visit date not found   Visit was conducted via telehealth video.  Both patient and physician are located in the state of [place] .  CHIEF COMPLAINT:  Chief Complaint  Patient presents with   Acute Visit    Cough        HPI:  Discussed the use of AI scribe software for clinical note transcription with the patient, who gave verbal consent to proceed.  History of Present Illness   Carol Harris is a 68 year old female who presents with severe chills and respiratory symptoms.  She has been experiencing severe chills that began last night and persisted throughout the night. She has been taking Tylenol  approximately every four hours to manage these symptoms.  She describes a mild cough that seems to originate from the back of the throat rather than the lungs. Additionally, she reports having a splitting headache and watery eyes.  Her granddaughter recently had RSV, and both she and her granddaughter received the RSV vaccine last year.  She uses a CPAP machine at night and is currently taking aspirin  daily, therefore she does not take ibuprofen. She has been using Tylenol  for symptom relief.         OBJECTIVE:       06/20/2024    8:23 AM  Depression screen PHQ 2/9  Decreased Interest 0  Down, Depressed, Hopeless 0  PHQ - 2 Score 0  Altered sleeping 0  Tired, decreased energy 0  Change in appetite 3  Feeling bad or failure about yourself  0  Trouble concentrating 1  Moving slowly or fidgety/restless 0  Suicidal thoughts 0  PHQ-9 Score 4   Difficult doing work/chores Not difficult at all     Data saved with a previous flowsheet row definition     BP Readings from Last 3 Encounters:  07/09/24 128/80  07/05/24 (!) 148/88  07/02/24 (!) 154/94    There were no vitals taken for this visit.   Physical Exam          Physical Exam Constitutional:      Appearance:  Normal appearance.  Neurological:     General: No focal deficit present.     Mental Status: She is alert and oriented to person, place, and time. Mental status is at baseline.     ASSESSMENT/PLAN:   Assessment & Plan RSV (acute bronchiolitis due to respiratory syncytial virus)  Acute cough    Assessment and Plan    Acute bronchiolitis due to respiratory syncytial virus Acute bronchiolitis likely due to RSV exposure. Vaccination may reduce symptom duration. Infectious for five days. - Prescribed short course of steroids for chills and fatigue. - Advised Tylenol  every six hours as needed due to daily aspirin  use. - Recommended nightly Flonase nasal spray to decrease drainage and prevent bacterial sinus infection. - Sent prescription to pharmacy.         Juana Montini A. Vita MD [facility] "

## 2025-01-21 ENCOUNTER — Ambulatory Visit

## 2025-07-04 ENCOUNTER — Encounter: Payer: Self-pay | Admitting: Family Medicine

## 2025-07-10 ENCOUNTER — Inpatient Hospital Stay: Admitting: Hematology and Oncology
